# Patient Record
Sex: Male | Born: 1940 | Race: White | Hispanic: No | Marital: Married | Smoking: Former smoker
Health system: Southern US, Community
[De-identification: ages and names within clinical notes are randomized; demographics above are authoritative.]

## PROBLEM LIST (undated history)

## (undated) DIAGNOSIS — G4730 Sleep apnea, unspecified: Secondary | ICD-10-CM

## (undated) DIAGNOSIS — I429 Cardiomyopathy, unspecified: Secondary | ICD-10-CM

## (undated) DIAGNOSIS — R011 Cardiac murmur, unspecified: Secondary | ICD-10-CM

## (undated) DIAGNOSIS — I444 Left anterior fascicular block: Secondary | ICD-10-CM

## (undated) DIAGNOSIS — R058 Other specified cough: Secondary | ICD-10-CM

## (undated) DIAGNOSIS — Z95810 Presence of automatic (implantable) cardiac defibrillator: Secondary | ICD-10-CM

## (undated) DIAGNOSIS — E7800 Pure hypercholesterolemia, unspecified: Secondary | ICD-10-CM

## (undated) DIAGNOSIS — I493 Ventricular premature depolarization: Secondary | ICD-10-CM

## (undated) DIAGNOSIS — Z8679 Personal history of other diseases of the circulatory system: Secondary | ICD-10-CM

## (undated) DIAGNOSIS — I219 Acute myocardial infarction, unspecified: Secondary | ICD-10-CM

## (undated) DIAGNOSIS — N189 Chronic kidney disease, unspecified: Secondary | ICD-10-CM

## (undated) DIAGNOSIS — D649 Anemia, unspecified: Secondary | ICD-10-CM

## (undated) DIAGNOSIS — I4510 Unspecified right bundle-branch block: Secondary | ICD-10-CM

## (undated) DIAGNOSIS — K219 Gastro-esophageal reflux disease without esophagitis: Secondary | ICD-10-CM

## (undated) DIAGNOSIS — I452 Bifascicular block: Secondary | ICD-10-CM

## (undated) DIAGNOSIS — I499 Cardiac arrhythmia, unspecified: Secondary | ICD-10-CM

## (undated) DIAGNOSIS — J189 Pneumonia, unspecified organism: Secondary | ICD-10-CM

## (undated) DIAGNOSIS — Z9889 Other specified postprocedural states: Secondary | ICD-10-CM

## (undated) DIAGNOSIS — I10 Essential (primary) hypertension: Secondary | ICD-10-CM

## (undated) DIAGNOSIS — M1990 Unspecified osteoarthritis, unspecified site: Secondary | ICD-10-CM

## (undated) DIAGNOSIS — S225XXA Flail chest, initial encounter for closed fracture: Secondary | ICD-10-CM

## (undated) DIAGNOSIS — R05 Cough: Secondary | ICD-10-CM

## (undated) DIAGNOSIS — K649 Unspecified hemorrhoids: Secondary | ICD-10-CM

## (undated) DIAGNOSIS — C61 Malignant neoplasm of prostate: Secondary | ICD-10-CM

## (undated) DIAGNOSIS — I469 Cardiac arrest, cause unspecified: Secondary | ICD-10-CM

## (undated) DIAGNOSIS — I2510 Atherosclerotic heart disease of native coronary artery without angina pectoris: Secondary | ICD-10-CM

## (undated) DIAGNOSIS — Z950 Presence of cardiac pacemaker: Secondary | ICD-10-CM

## (undated) DIAGNOSIS — I509 Heart failure, unspecified: Secondary | ICD-10-CM

## (undated) HISTORY — PX: HERNIA REPAIR: SHX51

## (undated) HISTORY — PX: HAND SURGERY: SHX662

## (undated) HISTORY — PX: ICD IMPLANT: EP1208

## (undated) HISTORY — PX: CARDIAC CATHETERIZATION: SHX172

## (undated) HISTORY — PX: CORONARY ANGIOPLASTY: SHX604

## (undated) HISTORY — PX: TRACHEOSTOMY: SUR1362

## (undated) HISTORY — PX: OTHER SURGICAL HISTORY: SHX169

## (undated) HISTORY — PX: PROSTATE BIOPSY: SHX241

## (undated) HISTORY — DX: Presence of cardiac pacemaker: Z95.0

## (undated) HISTORY — PX: INGUINAL HERNIA REPAIR: SUR1180

## (undated) HISTORY — PX: INSERT / REPLACE / REMOVE PACEMAKER: SUR710

## (undated) HISTORY — PX: EYE SURGERY: SHX253

## (undated) HISTORY — DX: Essential (primary) hypertension: I10

## (undated) HISTORY — PX: TONSILLECTOMY: SUR1361

## (undated) HISTORY — PX: REPAIR EXTENSOR TENDON HAND: SUR1171

## (undated) HISTORY — PX: BACK SURGERY: SHX140

## (undated) HISTORY — DX: Presence of automatic (implantable) cardiac defibrillator: Z95.810

## (undated) MED FILL — Iron Sucrose Inj 20 MG/ML (Fe Equiv): INTRAVENOUS | Qty: 10 | Status: AC

---

## 2009-01-07 HISTORY — PX: COLONOSCOPY: SHX174

## 2014-12-23 ENCOUNTER — Other Ambulatory Visit: Payer: Self-pay | Admitting: Urology

## 2014-12-23 DIAGNOSIS — C61 Malignant neoplasm of prostate: Secondary | ICD-10-CM

## 2015-01-11 DIAGNOSIS — C61 Malignant neoplasm of prostate: Secondary | ICD-10-CM | POA: Diagnosis not present

## 2015-01-11 DIAGNOSIS — Z0000 Encounter for general adult medical examination without abnormal findings: Secondary | ICD-10-CM | POA: Diagnosis not present

## 2015-01-11 DIAGNOSIS — I255 Ischemic cardiomyopathy: Secondary | ICD-10-CM | POA: Diagnosis not present

## 2015-01-11 DIAGNOSIS — I420 Dilated cardiomyopathy: Secondary | ICD-10-CM | POA: Diagnosis not present

## 2015-01-12 ENCOUNTER — Telehealth: Payer: Self-pay | Admitting: Medical Oncology

## 2015-01-12 ENCOUNTER — Encounter: Payer: Self-pay | Admitting: Medical Oncology

## 2015-01-12 NOTE — Telephone Encounter (Signed)
Oncology Nurse Navigator Documentation  Oncology Nurse Navigator Flowsheets 01/12/2015  Navigator Encounter Type Telephone- Spoke with Missy at [facility] to request pathology slides for Prostate [facility] 01/20/15.  Barriers/Navigation Needs Coordination of Care  Interventions Coordination of Care

## 2015-01-12 NOTE — Progress Notes (Signed)
Oncology Nurse Navigator Documentation  Oncology Nurse Navigator Flowsheets 01/03/2015 01/12/2015  Navigator Location [facility]- -  Navigator Encounter Type Introductory phone call Telephone  Barriers/Navigation Needs - Coordination of Care  Interventions - Coordination of Care  Time Spent with Patient 15 -

## 2015-01-12 NOTE — Telephone Encounter (Signed)
Oncology Nurse Navigator Documentation  Oncology Nurse Navigator Flowsheets 01/03/2015 01/12/2015 01/12/2015  Navigator Location [facility] - -  Navigator Encounter Type Introductory phone call Telephone Telephone-   I confirmed with the patient he is aware of his referral to the clinic 01/20/15. He states that he received a call from [facility] regarding his scans scheduled for 01/16/15. They are having a hard time getting insurance approval. He asked what he will need to do if they can not get theses scheduled. I informed him that we really need the results for the clinic and we would reschedule his appointment for 1/24. I asked him to call me when he hears from [facility] regarding these scans. He voiced understanding.   He confirmed he received the packet of information in the mail. I asked him to complete and bring the day of his appointment.    I asked him to call me if he has any questions or concerns regarding his appointments or the forms he needs to complete.  Telephone - - Outgoing Call;Appt Confirmation/Clarification  Barriers/Navigation Needs - Coordination of Care -  Interventions - Coordination of Care -  Time Spent with Patient 15 - 15

## 2015-01-16 ENCOUNTER — Encounter (HOSPITAL_COMMUNITY)
Admission: RE | Admit: 2015-01-16 | Discharge: 2015-01-16 | Disposition: A | Discharge status: Home / Self Care | Payer: Commercial Managed Care - HMO | Source: Ambulatory Visit | Attending: Urology | Admitting: Urology

## 2015-01-16 DIAGNOSIS — C61 Malignant neoplasm of prostate: Secondary | ICD-10-CM

## 2015-01-16 MED ORDER — TECHNETIUM TC 99M MEDRONATE IV KIT
25.7000 | PACK | Freq: Once | INTRAVENOUS | Status: AC | PRN
  Administered 2015-01-16: 25.7 via INTRAVENOUS

## 2015-01-19 ENCOUNTER — Telehealth: Payer: Self-pay | Admitting: Medical Oncology

## 2015-01-19 ENCOUNTER — Encounter: Payer: Self-pay | Admitting: Radiation Oncology

## 2015-01-19 NOTE — Telephone Encounter (Signed)
Oncology Nurse Navigator Documentation  Oncology Nurse Navigator Flowsheets 01/12/2015 01/12/2015 01/19/2015  Navigator Location - - -  Navigator Encounter Type Telephone Telephone Telephone  Telephone - Outgoing Call;Appt Confirmation/Clarification Outgoing Call;Appt Confirmation/Clarification- I reminded him to bring his completed medical forms. We reviewed directions to [facility] and the registration process. He voiced understanding and I asked him to call with any questions or concerns.  Barriers/Navigation Needs Coordination of Care - No barriers at this time  Interventions Coordination of Care - -  Time Spent with Patient - 15 15

## 2015-01-19 NOTE — Progress Notes (Addendum)
GU Location of Tumor / Histology: prostatic adenocarcinoma  If Prostate Cancer, Gleason Score is (5 + 9) and PSA is (12.22) on 10/23/14  Jesse Mann referred to Dr. Louis Meckel on 10/07/14 by Dr. Williams Che for an elevated PSA.  Biopsies of prostate (if applicable) revealed:    Past/Anticipated interventions by urology, if any: Biopsy and referral to [facility]  Past/Anticipated interventions by medical oncology, if any: consulted during [facility]  Weight changes, if any: no  Bowel/Bladder complaints, if any: dysuria, frequency, nocturia x 1 and urgency   Nausea/Vomiting, if any: no  Pain issues, if any:  Low back pain  SAFETY ISSUES:  Prior radiation? no  Pacemaker/ICD? no  Possible current pregnancy? no  Is the patient on methotrexate? no  Current Complaints / other details:  75 year old male. NKDA. Retired. [place] with 2 sons and 1 daughter. Family hx of colon cancer:Father. Prostate volume 47.75 cc.Reports his has colonscopy was in 2011. Reports he does not perform regular self testicular exams.   Reports fatigue and loss of sleep. Reports low back pain. Wear glasses. Reports sinus problems. Wears dentures. Reports an irregular heart beat. Reports a cough. Reports rectal bleeding. Reports dribbling with urination.

## 2015-01-20 ENCOUNTER — Encounter: Payer: Self-pay | Admitting: Medical Oncology

## 2015-01-20 ENCOUNTER — Ambulatory Visit
Admission: RE | Admit: 2015-01-20 | Discharge: 2015-01-20 | Disposition: A | Discharge status: Home / Self Care | Payer: Commercial Managed Care - HMO | Source: Ambulatory Visit | Attending: Radiation Oncology | Admitting: Radiation Oncology

## 2015-01-20 ENCOUNTER — Ambulatory Visit (HOSPITAL_BASED_OUTPATIENT_CLINIC_OR_DEPARTMENT_OTHER): Payer: Commercial Managed Care - HMO | Admitting: Oncology

## 2015-01-20 ENCOUNTER — Encounter: Payer: Self-pay | Admitting: General Practice

## 2015-01-20 ENCOUNTER — Encounter: Payer: Self-pay | Admitting: Radiation Oncology

## 2015-01-20 ENCOUNTER — Encounter: Payer: Self-pay | Admitting: Adult Health

## 2015-01-20 VITALS — BP 131/67 | HR 69 | Resp 16 | Ht 66.0 in | Wt 147.5 lb

## 2015-01-20 DIAGNOSIS — C61 Malignant neoplasm of prostate: Secondary | ICD-10-CM | POA: Insufficient documentation

## 2015-01-20 HISTORY — DX: Malignant neoplasm of prostate: C61

## 2015-01-20 HISTORY — DX: Cardiac arrhythmia, unspecified: I49.9

## 2015-01-20 HISTORY — DX: Sleep apnea, unspecified: G47.30

## 2015-01-20 HISTORY — DX: Gastro-esophageal reflux disease without esophagitis: K21.9

## 2015-01-20 HISTORY — DX: Cardiac murmur, unspecified: R01.1

## 2015-01-20 HISTORY — DX: Unspecified osteoarthritis, unspecified site: M19.90

## 2015-01-20 HISTORY — DX: Unspecified hemorrhoids: K64.9

## 2015-01-20 HISTORY — DX: Acute myocardial infarction, unspecified: I21.9

## 2015-01-20 NOTE — Progress Notes (Signed)
Radiation Oncology         [phone] ________________________________  Multidisciplinary Prostate Cancer Clinic  Initial Radiation Oncology Consultation  Name: Jesse Mann MRN: [id number]  Date: 01/20/2015  DOB: May 05, 1940  [id number], Herbie Baltimore, MD  Raynelle Bring, MD   REFERRING PHYSICIAN: Raynelle Bring, MD  DIAGNOSIS: 75 y.o. 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  No diagnosis found.  HISTORY OF PRESENT ILLNESS::Jesse Mann is a 75 y.o. gentleman.  He was noted to have an elevated PSA of 10.1 by his primary care physician, Dr. Venetia Maxon.  Accordingly, he was referred for evaluation in urology by Dr. Louis Meckel on 10/07/14,  digital rectal examination was performed at that time revealing a 45 gram gland with a palpable nodule involving the left mid gland.  The patient proceeded to transrectal ultrasound with 12 biopsies of the prostate on 12/20/14.  The prostate volume measured 47.75 cc.  Out of 12 core biopsies,10 were positive.  The maximum Gleason score was 5+4, and this was seen in bilaterally.  Pelvic Ct on 01/16/15 showed no evidence of metastatic disease. Bone scan on same date showed two punctate areas in the left face/skull but these were not suggestive of metastatic disease.   The patient reviewed the biopsy results with his urologist and he has kindly been referred today to the multidisciplinary prostate cancer clinic for presentation of pathology and radiology studies in our conference for discussion of potential radiation treatment options and clinical evaluation.      PREVIOUS RADIATION THERAPY: No  PAST MEDICAL HISTORY:  has a past medical history of Prostate cancer ([place]); Arthritis; Cardiac arrhythmia; Cardiac murmur; GERD (gastroesophageal reflux disease); Myocardial infarction [facility]); and Sleep apnea.    PAST SURGICAL HISTORY: Past Surgical History  Procedure Laterality Date  . Prostate biopsy     . Cardiac surgery    . Inguinal hernia repair    . Tonsillectomy      FAMILY HISTORY: family history includes Cancer in his father; Heart failure in his mother.  SOCIAL HISTORY:  reports that he quit smoking about 50 years ago. His smoking use included Cigarettes. He has a 10 pack-year smoking history. He has never used smokeless tobacco. He reports that he does not drink alcohol or use illicit drugs.  ALLERGIES: Review of patient's allergies indicates no known allergies.  MEDICATIONS:  Current Outpatient Prescriptions  Medication Sig Dispense Refill  . carvedilol (COREG) 6.25 MG tablet Take 6.25 mg by mouth.    . nitroGLYCERIN (NITROSTAT) 0.4 MG SL tablet Place 0.4 mg under the tongue.    . simvastatin (ZOCOR) 10 MG tablet Take 10 mg by mouth.    Marland Kitchen aspirin EC 81 MG tablet Take 81 mg by mouth.     No current facility-administered medications for this encounter.    REVIEW OF SYSTEMS:  A 15 point review of systems is documented in the electronic medical record. This was obtained by the nursing staff. However, I reviewed this with the patient to discuss relevant findings and make appropriate changes.  A comprehensive review of systems was negative..  The patient completed an IPSS and IIEF questionnaire.  His IPSS score was 19 indicating moderate urinary outflow obstructive symptoms.  He indicated that his erectile function is not applicable.   Pt reports dysuria, frequency, nocturia x 1 and urgency.  NKDA. Retired. [place] with 2 sons and 1 daughter. Family hx of colon cancer:Father. Prostate volume 47.75  cc.Reports his has colonscopy was in 2011. Reports he does not perform regular self testicular exams.  Reports fatigue and loss of sleep. Reports low back pain. Wear glasses. Reports sinus problems. Wears dentures. Reports an irregular heart beat. Reports a cough. Reports rectal bleeding. Reports dribbling with urination.    PHYSICAL EXAM: This patient is in no acute  distress.  He is alert and oriented.   vitals were not taken for this visit. He exhibits no respiratory distress or labored breathing.  He appears neurologically intact.  His mood is pleasant.  His affect is appropriate.  Please note the digital rectal exam findings described above.  KPS = 100  100 - Normal; no complaints; no evidence of disease. 90   - Able to carry on normal activity; minor signs or symptoms of disease. 80   - Normal activity with effort; some signs or symptoms of disease. 48   - Cares for self; unable to carry on normal activity or to do active work. 60   - Requires occasional assistance, but is able to care for most of his personal needs. 50   - Requires considerable assistance and frequent medical care. 31   - Disabled; requires special care and assistance. 67   - Severely disabled; hospital admission is indicated although death not imminent. 33   - Very sick; hospital admission necessary; active supportive treatment necessary. 10   - Moribund; fatal processes progressing rapidly. 0     - Dead  Karnofsky DA, Abelmann WH, Craver LS and Burchenal JH [phone]) The use of the nitrogen mustards in the palliative treatment of carcinoma: with particular reference to bronchogenic carcinoma Cancer 1 [id number]   LABORATORY DATA:  No results found for: WBC, HGB, HCT, MCV, PLT No results found for: NA, K, CL, CO2 No results found for: ALT, AST, GGT, ALKPHOS, BILITOT   RADIOGRAPHY: Nm Bone Scan Whole Body  01/16/2015  CLINICAL DATA:  Prostate cancer. EXAM: NUCLEAR MEDICINE WHOLE BODY BONE SCAN TECHNIQUE: Whole body anterior and posterior images were obtained approximately 3 hours after intravenous injection of radiopharmaceutical. RADIOPHARMACEUTICALS:  [id number] millicurie mCi [id number] MDP IV COMPARISON:  No prior. FINDINGS: Bilateral renal function excretion. Two punctate areas of increased activity noted over the left facial region. Punctate area of increased activity noted over the  right occipital region. Maxillofacial and head CT with attention to bone window suggested for further evaluation. Mild activity noted about the left knee is most likely degenerative. IMPRESSION: 1. Two punctate areas of increased activity noted about the left face/skull. One punctate area of increased activity noted about the right occipital skull. Maxillofacial and head CT with attention to bone windows suggested for further evaluation. 2.  Changes consistent degenerative changes left knee. 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 His Gleason's Score puts him into the high risk group.  Accordingly he is eligible for a variety of potential treatment options including prostatectomy vs radiation treatment with 2 years of androgen deprivation.  PLAN: Today, I talked to the patient and family about the findings and work-up thus far.  We discussed the natural history of prostate adenocarcinoma and general treatment, highlighting the role of radiotherapy in the management.  We discussed the available radiation techniques, and focused on the details of logistics and delivery.  We  reviewed the anticipated acute and late sequelae associated with radiation in this setting.  The patient was encouraged to ask questions that I answered to the best of my ability.  I filled out a patient counseling form during our discussion including treatment diagrams.  We retained a copy for our records.  I spent 60 minutes minutes face to face with the patient and more than 50% of that time was spent in counseling and/or coordination of care.    ------------------------------------------------  Sheral Apley. Tammi Klippel, M.D.    This document serves as a record of services personally performed by Tyler Pita, MD. It was created on his behalf by Jenell Milliner, a trained medical scribe. The creation of this record is based on the scribe's personal observations and the provider's statements to them. This document has been checked and approved by the attending provider.

## 2015-01-20 NOTE — Progress Notes (Signed)
Please see consult note.  

## 2015-01-20 NOTE — Consult Note (Signed)
Chief Complaint  Prostate Cancer   Reason For Visit  Reason for consult: To discuss treatment options for high risk prostate cancer. Physician requesting consult: Dr. Burman Nieves PCP: Dr. Williams Che Location of consult: Prostate Cancer Multidisciplinary Clinic at [facility]   History of Present Illness  Jesse Mann is a 75 year old gentleman with a history of myocardial infarction due to coronary artery disease s/p cardiac stent placement in 2014 (on ASA 81 mg) and rheumatoid arthritis.  He was seen by Dr. Louis Meckel initially in October when his primary care physician had noted his PSA to be elevated at 10.1 and the patient was complaining of increasingly bothersome urinary symptoms.  He was noted to have an abnormal DRE with nodularity at the left mid prostate and his repeat PSA was 12.22.  He eventually underwent a TRUS biopsy of the prostate on 12/20/14 which confirmed Gleason 5+4=9 adenocarcinoma of the prostate with 9 out 12 biopsy cores positive for malignancy including high volume percentages in all of the left sided cores. He was noted to have a prominent median prostate lobe. Staging studies were performed on 01/16/15 including a CT scan of the pelvis and a bone scan. His CT scan was negative for metastatic disease and his bone scan demonstrated increased activity in two foci in the face/skull that are felt to be unlikely to represent metastatic disease.  Further imaging was not recommended by radiology at the [facility] conference today.  He has undergone bilateral hernia repairs.  TNM stage: cT3a N M  PSA: 12.22 Gleason score: 5+4=9 Biopsy (12/20/14): 9/12 cores positive    Left: L lateral apex (90%, 4+5=9), L apex (70%, 4+3=7, PNI), L lateral mid (95%, 5+3=8), L mid (90%, 5+4=9, PNI), L lateral base (95%, 4+4=8), L base (90%, 5+4=9)   Right: R lateral apex (10%, 4+3=7), R mid (50%, 4+5=9), R lateral base (< 1%, 4+4=8) Prostate volume: 47.8 cc, median lobe  Urinary function:  He has significant LUTS including incomplete bladder emptying, intermittency, weak stream, and straining to void.  He is not on medical therapy and he is not overly bothered by his symptoms. Erectile function:  He has severe erectile dysfunction and is not sexually active.  SHIM score is 1.  This is not a priority to him.   Past Medical History  1. History of arthritis (Z87.39)  2. History of cardiac arrhythmia (Z86.79)  3. History of heartburn (Z87.898)  4. History of myocardial infarction (I25.2)  5. History of sleep apnea (Z86.69)  Surgical History  1. History of Cath Stent Placement  2. History of Inguinal Hernia Repair  3. History of Tonsillectomy  Current Meds  1. Aspirin 81 MG TABS;  Therapy: (Recorded:30Sep2016) to Recorded  2. Carvedilol 3.125 MG Oral Tablet;  Therapy: (Recorded:30Sep2016) to Recorded  3. Probiotic CAPS;  Therapy: (Recorded:30Sep2016) to Recorded  4. Simvastatin 10 MG Oral Tablet;  Therapy: (Recorded:30Sep2016) to Recorded  Allergies  1. No Known Drug Allergies  Family History  1. Family history of Death of family member : Mother, Father  2. Family history of colon cancer (Z80.0) : Father  3. Family history of heart failure (Z82.49) : Mother  Social History   Denied: History of Alcohol use   Former smoker [phone])   Married   Number of children   Retired  Review of Systems AU Complete-Male: Constitutional, skin, eye, otolaryngeal, hematologic/lymphatic, cardiovascular, pulmonary, endocrine, musculoskeletal, gastrointestinal, neurological and psychiatric system(s) were reviewed and pertinent findings if present are noted and are  otherwise negative.  Constitutional: feeling tired (fatigue).  ENT: sinus problems.  Respiratory: cough.  Musculoskeletal: back pain.    Physical Exam Constitutional: Well nourished and well developed . No acute distress.    Results/Data  I have independently reviewed his medical records, PSA results, pathology  slides, bone scan, and CT scan today.  Findings are as described above.     Assessment  1. Prostate cancer (C61)  Discussion/Summary   1. High risk locally advanced prostate cancer: I had a detailed discussion with Jesse Mann, his wife, and his daughter today.  We discussed the fact that he appears to have a high grade, locally advanced prostate cancer without evidence of measurable metastatic disease.  However, he understands the high risk of potentially having micrometastatic disease and need for multimodality therapy to offer him the best chance at curative treatment.  We discussed options for treatment today including primary surgical therapy with the likely need for adjuvant therapy in the form of radiation (+/- ADT) vs primary radiation therapy with long term ADT.  We spent the majority of our discussion reviewing the pros and cons of these treatment options for him.   Jesse Mann understands the potential benefits of surgery with regard to his significant LUTS and prominent prostate median lobe.  However, he also understands the potential negative aspects of risk of major surgery with a history of CAD (he would need cardiac risk assessment from his cardiologist in [facility]) and the risk of incontinence at age 52 as well as the high chance he would still require adjuvant radiation therapy.  He understands the potential benefits of radiation therapy with regard to avoiding the morbidity and recovery with surgery but with potential to exacerbate his voiding symptoms and the need for long term ADT.   The patient was counseled about the natural history of prostate cancer and the standard treatment options that are available for prostate cancer. It was explained to him how his age and life expectancy, clinical stage, Gleason score, and PSA affect his prognosis, the decision to proceed with additional staging studies, as well as how that information influences recommended treatment strategies. We  discussed the roles for active surveillance, radiation therapy, surgical therapy, androgen deprivation, as well as ablative therapy options for the treatment of prostate cancer as appropriate to his individual cancer situation. We discussed the risks and benefits of these options with regard to their impact on cancer control and also in terms of potential adverse events, complications, and impact on quiality of life particularly related to urinary, bowel, and sexual function. The patient was encouraged to ask questions throughout the discussion today and all questions were answered to his stated satisfaction. In addition, the patient was provided with and/or directed to appropriate resources and literature for further education about prostate cancer and treatment options.   We discussed surgical therapy for prostate cancer including the different available surgical approaches. We discussed, in detail, the risks and expectations of surgery with regard to cancer control, urinary control, and erectile function as well as the expected postoperative recovery process. Additional risks of surgery including but not limited to bleeding, infection, hernia formation, nerve damage, lymphocele formation, bowel/rectal injury potentially necessitating colostomy, damage to the urinary tract resulting in urine leakage, urethral stricture, and the cardiopulmonary risks such as myocardial infarction, stroke, death, venothromboembolism, etc. were explained. The risk of open surgical conversion for robotic/laparoscopic prostatectomy was also discussed.   After a thoughtful discussion, Jesse Mann adamantly wishes to proceed with primary surgical  treatment.  I recommended that he get cardiac clearance prior to surgery and he understands that he should continue his aspirin 81 mg perioperatively with his history of cardiac stent placement.  I will notify Dr. Louis Meckel of Jesse Mann decision.  CC: Dr. Burman Nieves Dr. Williams Che Dr. Ledon Snare Dr. Zola Button  A total of 55 minutes were spent in the overall care of the patient today with 55 minutes in direct face to face consultation.    Signatures Electronically signed by : Raynelle Bring, M.D.; Jan 20 2015 12:25PM EST

## 2015-01-20 NOTE — Progress Notes (Signed)
Oncology Nurse Navigator Documentation  Oncology Nurse Navigator Flowsheets 01/12/2015 01/19/2015 01/20/2015  Navigator Location - - [facility]  Navigator Encounter Type Telephone Telephone Clinic/MDC  Telephone Outgoing Call;Appt Confirmation/Clarification Outgoing Call;Appt Confirmation/Clarification -  Abnormal Finding Date - - 11/02/2014  Confirmed Diagnosis Date - - 12/20/2014  Barriers/Navigation Needs - No barriers at this time No barriers at this time  Interventions - - -  Support Groups/Services - - Prostate Support Group;Friends and Family  Time Spent with Patient 15 15 30                                  Care Plan Summary  Name: Mr. Jesse Mann DOB: 16-Oct-1940   Your Medical Team:   Urologist -  Dr. Raynelle Bring, [facility]  Radiation Oncologist - Dr. Tyler Pita, [facility]   Medical Oncologist - Dr. Zola Button, [place]  Recommendations: 1) Surgery 2) Androgen Deprivation 3) Radiation  * These recommendations are based on information available as of today's consult.      Recommendations may change depending on the results of further tests or exams.    Next Steps: 1) Dr. Carlton Adam office will call you with details for surgery.( He will contact your cardiologist for cardiac clearance. )   When appointments need to be scheduled, you will be contacted by [facility] and/or [facility].  Questions?  Please do not hesitate to call Jesse Rue, Jesse Mann, Jesse Mann, Jesse Mann at [phone] any questions or concerns.  Shirlean Mylar is your Oncology Nurse Navigator and is available to assist you while you're receiving your medical care at [facility].

## 2015-01-20 NOTE — Progress Notes (Signed)
   Survivorship Program  Jesse Mann is a very pleasant 75 y.o. gentleman from [place], [place] with a diagnosis of prostate adenocarcinoma. He presents today with his wife to the [facility]) for treatment consideration and recommendations from the urologist/surgeon, radiation oncologist, and medical oncologist.    I briefly met with Jesse Mann and his wife during his [place] visit today. We discussed the purpose of the Survivorship Clinic, which will include monitoring for recurrence, coordinating completion of age and gender-appropriate cancer screenings, promotion of overall wellness, as well as managing potential late/long-term side effects of anti-cancer treatments.     As of today, the intent of treatment for Jesse Mann is cure/local control, therefore he will be eligible for the Survivorship Clinic upon his completion of treatment.  His survivorship care plan (SCP) will be reviewed with him during an in-person visit with myself once he has completed treatment.    Jesse Mann was encouraged to ask questions and all questions were answered to his satisfaction.  He was given my business card and encouraged to contact me with any concerns regarding survivorship.  I look forward to participating in his care.    Mike Craze, NP [place] [phone]

## 2015-01-20 NOTE — Progress Notes (Signed)
[place] Psychosocial Distress Screening Spiritual Care  Shadowed by counseling intern Vaughan Sine, met with Mr Breighner, wife Lavonna Monarch, and daughter Sharyn Lull at [facility] to introduce [place] team/resources, reviewing distress screen per protocol.  The patient scored a 5 on the Psychosocial Distress Thermometer which indicates moderate distress. Also assessed for distress and other psychosocial needs.   ONCBCN DISTRESS SCREENING 01/20/2015  Screening Type Initial Screening  Distress experienced in past week (1-10) 5  Practical problem type Insurance  Emotional problem type Adjusting to illness  Spiritual/Religous concerns type Facing my mortality  Referral to support programs Yes  Other Spiritual Care, counseling interns   Pt and family verbalized gratitude for the helpful thoroughness of [place] and healthcare team, noting that the additional info and support have decreased distress level to 2.  Per Mr Kaufhold, enjoying family (including grands and first great-grandchild on the way) and [place] [employer] are two key sources of meaning and purpose in his life.  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  Per pt, receiving this dx reinforces the preciousness of these priorities.  He reports good support from family and community.  Follow up needed: No.  [place] family is aware of going [place] team/resource availability, but please also page as needs arise/circumstances change.  Thank you.  [place], [place], [facility] Pager [phone] Voicemail  [phone]

## 2015-01-20 NOTE — Consult Note (Signed)
Reason for Referral: prostate cancer.  HPI: Jesse Mann is a pleasant 75 year old gentleman currently of [place] where he lived the majority of his life. He does have history of coronary artery disease with stent placement in the past but no active heart issues. He started developing symptoms of dysuria, frequency and urgency and low urine stream. He was evaluated by urology and found to have an elevated PSA of 12.22. 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 Staging workup including CT scan and bone scan did not show any evidence of metastatic disease. Patient referred to prostate cancer multidisciplinary clinic for discussion. He currently reports no major complaints other than low urinary stream but no pain or nocturia. He has not reported any hematuria. He continues to feel reasonably active and currently retired from his work. He is able to perform all activities of daily living without any hindrance or decline.  He does not report any headaches, blurry vision, syncope or seizures. He does not report any fevers or chills or sweats. Does not report any cough, wheezing or hemoptysis. Does not report any nausea, vomiting, abdominal pain does not report any constipation, diarrhea or satiety. He does not report any frequency urgency or hesitancy. Does not report any skeletal complaints. Remaining review of systems unremarkable.  Past Medical History  Diagnosis Date  . Prostate cancer ([place])   . Arthritis   . Cardiac arrhythmia   . Cardiac murmur   . GERD (gastroesophageal reflux disease)   . Myocardial infarction ([place])   . Sleep apnea   . Hemorrhoids   :  Past Surgical History  Procedure Laterality Date  . Prostate biopsy    . Cardiac surgery    . Inguinal hernia repair    . Tonsillectomy    . Repair extensor tendon hand    . Hernia repair    :   Current outpatient prescriptions:  .   aspirin EC 81 MG tablet, Take 81 mg by mouth., Disp: , Rfl:  .  carvedilol (COREG) 6.25 MG tablet, Take 6.25 mg by mouth., Disp: , Rfl:  .  nitroGLYCERIN (NITROSTAT) 0.4 MG SL tablet, Place 0.4 mg under the tongue., Disp: , Rfl:  .  Probiotic Product (PROBIOTIC DAILY PO), Take by mouth., Disp: , Rfl:  .  simvastatin (ZOCOR) 10 MG tablet, Take 10 mg by mouth., Disp: , Rfl: :  No Known Allergies:  Family History  Problem Relation Age of Onset  . Heart failure Mother   . Cancer Father     colon  . Cancer Paternal Grandmother     type unknown  :  Social History   Social History  . Marital Status: Married    Spouse Name: N/A  . Number of Children: N/A  . Years of Education: N/A   Occupational History  . Not on file.   Social History Main Topics  . Smoking status: Former Smoker -- 1.00 packs/day for 10 years    Types: Cigarettes    Quit date: 01/07/1965  . Smokeless tobacco: Never Used  . Alcohol Use: No  . Drug Use: No  . Sexual Activity: No   Other Topics Concern  . Not on file   Social History Narrative  :  Pertinent items are noted in HPI.  Exam: ECOG 0 General appearance: alert and cooperative appeared without distress. Head: Normocephalic, without obvious abnormality Throat: lips, mucosa, and tongue normal; teeth and gums  normal no oral ulcers or lesions. Neck: no adenopathy Back: negative Resp: clear to auscultation bilaterally Chest wall: no tenderness Cardio: regular rate and rhythm, S1, S2 normal, no murmur, click, rub or gallop GI: soft, non-tender; bowel sounds normal; no masses,  no organomegaly no shifting dullness or ascites. Extremities: extremities normal, atraumatic, no cyanosis or edema Pulses: 2+ and symmetric Skin: Skin color, texture, turgor normal. No rashes or lesions Lymph nodes: Cervical, supraclavicular, and axillary nodes normal.    Nm Bone Scan Whole Body  01/16/2015  CLINICAL DATA:  Prostate cancer. EXAM: NUCLEAR MEDICINE WHOLE  BODY BONE SCAN TECHNIQUE: Whole body anterior and posterior images were obtained approximately 3 hours after intravenous injection of radiopharmaceutical. RADIOPHARMACEUTICALS:  [id number] millicurie mCi [id number] MDP IV COMPARISON:  No prior. FINDINGS: Bilateral renal function excretion. Two punctate areas of increased activity noted over the left facial region. Punctate area of increased activity noted over the right occipital region. Maxillofacial and head CT with attention to bone window suggested for further evaluation. Mild activity noted about the left knee is most likely degenerative. IMPRESSION: 1. Two punctate areas of increased activity noted about the left face/skull. One punctate area of increased activity noted about the right occipital skull. Maxillofacial and head CT with attention to bone windows suggested for further evaluation. 2.  Changes consistent degenerative changes left knee. Electronically Signed   By: Marcello Moores  Register   On: 01/16/2015 14:52    Assessment and Plan:    75 year old gentleman with prostate cancer diagnosed in December 2016. He presented with a PSA of 12.22 and a Gleason score 4+5 = 9 with high-volume disease. He does have some urinary symptoms including slow stream and frequency and occasional urgency. His pathology was discussed today with the reviewing pathologist. His imaging studies were also discussed with radiology. He has localized high-risk cancer and his treatment options were reviewed today.  Definitive radiation therapy with androgen deprivation is a surgical option. The role of hormone therapy was reviewed today and the duration of about 2 years was also discussed. Complications associated with this therapy were reviewed including hot flashes, weight gain, osteoporosis, erectile dysfunction among others.  Alternatively, radical prostatectomy would be a consideration but might necessitate adjuvant radiation therapy afterwards if he has extraprostatic  extension or potential positive margins.  Both of these options are fairly reasonable and he will consider and make his decision in the near future.

## 2015-01-23 ENCOUNTER — Encounter: Payer: Self-pay | Admitting: *Deleted

## 2015-01-30 ENCOUNTER — Telehealth: Payer: Self-pay | Admitting: Medical Oncology

## 2015-01-30 ENCOUNTER — Encounter: Payer: Self-pay | Admitting: Medical Oncology

## 2015-01-30 NOTE — Telephone Encounter (Signed)
Oncology Nurse Navigator Documentation  Oncology Nurse Navigator Flowsheets 01/19/2015 01/20/2015 01/30/2015  Navigator Location - [facility] -  Navigator Encounter Type Telephone Clinic/MDC Telephone  Telephone Outgoing Call;Appt Confirmation/Clarification - Incoming Call- Jesse Mann called stating he has not heard anything from Dr. Carlton Adam office regarding his surgery. He needed cardiac clearance and he is not sure if they were able to obtain. I informed him that I would call Dr. Carlton Adam office to inquire. He voiced understanding.  Abnormal Finding Date - 11/02/2014 -  Confirmed Diagnosis Date - 12/20/2014 -  Barriers/Navigation Needs No barriers at this time No barriers at this time -  Interventions - - Coordination of Care  Coordination of Care - - Appts  Support Groups/Services - Prostate Support Group;Friends and Family -  Time Spent with Patient 15 30 15

## 2015-01-30 NOTE — Telephone Encounter (Signed)
Oncology Nurse Navigator Documentation  Oncology Nurse Navigator Flowsheets 01/20/2015 01/30/2015 01/30/2015  Navigator Location [facility] - -  Navigator Encounter Type Clinic/MDC Telephone Telephone  Telephone - Incoming Call Outgoing Call;Appt Confirmation/Clarification  Abnormal Finding Date 11/02/2014 - -  Confirmed Diagnosis Date 12/20/2014 - -  Barriers/Navigation Needs No barriers at this time - -  Interventions - Coordination of Care Coordination of Care- I spoke with Gwyn in  Dr. Carlton Adam office to inform her that Mr.Rawlins is concerned he has not heard anything regarding his surgery. She states that she will check with the surgery schedulers to see if they have a date and if they have heard from the cardiologist. If not she will speak with Dr. Louis Meckel and contact the patient. I notified Mr. Shinabarger of the above and asked him to call me back if he does not get a reply from [facility]. He voiced understanding.  Coordination of Care - Appts Appts  Support Groups/Services Prostate Support Group;Friends and Family - -  Time Spent with Patient [id number]

## 2015-02-03 ENCOUNTER — Other Ambulatory Visit: Payer: Self-pay | Admitting: Urology

## 2015-02-15 DIAGNOSIS — I429 Cardiomyopathy, unspecified: Secondary | ICD-10-CM | POA: Diagnosis not present

## 2015-02-15 DIAGNOSIS — I493 Ventricular premature depolarization: Secondary | ICD-10-CM | POA: Diagnosis not present

## 2015-02-15 DIAGNOSIS — I2510 Atherosclerotic heart disease of native coronary artery without angina pectoris: Secondary | ICD-10-CM | POA: Diagnosis not present

## 2015-02-21 DIAGNOSIS — I429 Cardiomyopathy, unspecified: Secondary | ICD-10-CM | POA: Diagnosis not present

## 2015-02-21 DIAGNOSIS — I2510 Atherosclerotic heart disease of native coronary artery without angina pectoris: Secondary | ICD-10-CM | POA: Diagnosis not present

## 2015-02-21 DIAGNOSIS — I493 Ventricular premature depolarization: Secondary | ICD-10-CM | POA: Diagnosis not present

## 2015-02-27 DIAGNOSIS — I429 Cardiomyopathy, unspecified: Secondary | ICD-10-CM | POA: Diagnosis not present

## 2015-02-27 DIAGNOSIS — I2510 Atherosclerotic heart disease of native coronary artery without angina pectoris: Secondary | ICD-10-CM | POA: Diagnosis not present

## 2015-02-28 ENCOUNTER — Encounter (HOSPITAL_COMMUNITY)
Admission: RE | Admit: 2015-02-28 | Discharge: 2015-02-28 | Disposition: A | Discharge status: Home / Self Care | Payer: Commercial Managed Care - HMO | Source: Ambulatory Visit | Attending: Urology | Admitting: Urology

## 2015-02-28 ENCOUNTER — Encounter (HOSPITAL_COMMUNITY): Payer: Self-pay

## 2015-02-28 DIAGNOSIS — D696 Thrombocytopenia, unspecified: Secondary | ICD-10-CM | POA: Diagnosis not present

## 2015-02-28 DIAGNOSIS — C7982 Secondary malignant neoplasm of genital organs: Secondary | ICD-10-CM | POA: Diagnosis not present

## 2015-02-28 DIAGNOSIS — D638 Anemia in other chronic diseases classified elsewhere: Secondary | ICD-10-CM | POA: Diagnosis not present

## 2015-02-28 DIAGNOSIS — N529 Male erectile dysfunction, unspecified: Secondary | ICD-10-CM | POA: Diagnosis present

## 2015-02-28 DIAGNOSIS — D62 Acute posthemorrhagic anemia: Secondary | ICD-10-CM | POA: Diagnosis not present

## 2015-02-28 DIAGNOSIS — Z800 Family history of malignant neoplasm of digestive organs: Secondary | ICD-10-CM | POA: Diagnosis not present

## 2015-02-28 DIAGNOSIS — N2889 Other specified disorders of kidney and ureter: Secondary | ICD-10-CM | POA: Diagnosis not present

## 2015-02-28 DIAGNOSIS — R339 Retention of urine, unspecified: Secondary | ICD-10-CM | POA: Diagnosis present

## 2015-02-28 DIAGNOSIS — C61 Malignant neoplasm of prostate: Secondary | ICD-10-CM | POA: Diagnosis not present

## 2015-02-28 DIAGNOSIS — M1990 Unspecified osteoarthritis, unspecified site: Secondary | ICD-10-CM | POA: Diagnosis present

## 2015-02-28 DIAGNOSIS — N99820 Postprocedural hemorrhage and hematoma of a genitourinary system organ or structure following a genitourinary system procedure: Secondary | ICD-10-CM | POA: Diagnosis not present

## 2015-02-28 DIAGNOSIS — G4730 Sleep apnea, unspecified: Secondary | ICD-10-CM | POA: Diagnosis not present

## 2015-02-28 DIAGNOSIS — R7989 Other specified abnormal findings of blood chemistry: Secondary | ICD-10-CM | POA: Diagnosis not present

## 2015-02-28 DIAGNOSIS — R071 Chest pain on breathing: Secondary | ICD-10-CM | POA: Diagnosis not present

## 2015-02-28 DIAGNOSIS — R008 Other abnormalities of heart beat: Secondary | ICD-10-CM | POA: Diagnosis not present

## 2015-02-28 DIAGNOSIS — I951 Orthostatic hypotension: Secondary | ICD-10-CM | POA: Diagnosis not present

## 2015-02-28 DIAGNOSIS — Z7982 Long term (current) use of aspirin: Secondary | ICD-10-CM | POA: Diagnosis not present

## 2015-02-28 DIAGNOSIS — Z79899 Other long term (current) drug therapy: Secondary | ICD-10-CM | POA: Diagnosis not present

## 2015-02-28 DIAGNOSIS — R079 Chest pain, unspecified: Secondary | ICD-10-CM | POA: Diagnosis not present

## 2015-02-28 DIAGNOSIS — D649 Anemia, unspecified: Secondary | ICD-10-CM | POA: Diagnosis not present

## 2015-02-28 DIAGNOSIS — S301XXA Contusion of abdominal wall, initial encounter: Secondary | ICD-10-CM | POA: Diagnosis not present

## 2015-02-28 DIAGNOSIS — M069 Rheumatoid arthritis, unspecified: Secondary | ICD-10-CM | POA: Diagnosis not present

## 2015-02-28 DIAGNOSIS — C775 Secondary and unspecified malignant neoplasm of intrapelvic lymph nodes: Secondary | ICD-10-CM | POA: Diagnosis not present

## 2015-02-28 DIAGNOSIS — Z87891 Personal history of nicotine dependence: Secondary | ICD-10-CM | POA: Diagnosis not present

## 2015-02-28 DIAGNOSIS — R0789 Other chest pain: Secondary | ICD-10-CM | POA: Diagnosis not present

## 2015-02-28 DIAGNOSIS — I252 Old myocardial infarction: Secondary | ICD-10-CM | POA: Diagnosis not present

## 2015-02-28 DIAGNOSIS — R42 Dizziness and giddiness: Secondary | ICD-10-CM | POA: Diagnosis not present

## 2015-02-28 DIAGNOSIS — R3916 Straining to void: Secondary | ICD-10-CM | POA: Diagnosis present

## 2015-02-28 DIAGNOSIS — R509 Fever, unspecified: Secondary | ICD-10-CM | POA: Diagnosis not present

## 2015-02-28 DIAGNOSIS — I2510 Atherosclerotic heart disease of native coronary artery without angina pectoris: Secondary | ICD-10-CM | POA: Diagnosis present

## 2015-02-28 DIAGNOSIS — R0781 Pleurodynia: Secondary | ICD-10-CM | POA: Diagnosis not present

## 2015-02-28 DIAGNOSIS — R3912 Poor urinary stream: Secondary | ICD-10-CM | POA: Diagnosis present

## 2015-02-28 DIAGNOSIS — R748 Abnormal levels of other serum enzymes: Secondary | ICD-10-CM | POA: Diagnosis not present

## 2015-02-28 DIAGNOSIS — C779 Secondary and unspecified malignant neoplasm of lymph node, unspecified: Secondary | ICD-10-CM | POA: Diagnosis not present

## 2015-02-28 DIAGNOSIS — Z8249 Family history of ischemic heart disease and other diseases of the circulatory system: Secondary | ICD-10-CM | POA: Diagnosis not present

## 2015-02-28 DIAGNOSIS — Z8546 Personal history of malignant neoplasm of prostate: Secondary | ICD-10-CM | POA: Diagnosis not present

## 2015-02-28 DIAGNOSIS — Z955 Presence of coronary angioplasty implant and graft: Secondary | ICD-10-CM | POA: Diagnosis not present

## 2015-02-28 HISTORY — DX: Cough: R05

## 2015-02-28 HISTORY — DX: Other specified cough: R05.8

## 2015-02-28 LAB — COMPREHENSIVE METABOLIC PANEL
ALBUMIN: 4.2 g/dL (ref 3.5–5.0)
ALK PHOS: 46 U/L (ref 38–126)
ALT: 13 U/L — AB (ref 17–63)
AST: 20 U/L (ref 15–41)
Anion gap: 6 (ref 5–15)
BILIRUBIN TOTAL: 0.6 mg/dL (ref 0.3–1.2)
BUN: 18 mg/dL (ref 6–20)
CALCIUM: 9.4 mg/dL (ref 8.9–10.3)
CO2: 27 mmol/L (ref 22–32)
Chloride: 107 mmol/L (ref 101–111)
Creatinine, Ser: 1.14 mg/dL (ref 0.61–1.24)
GFR calc Af Amer: >60 mL/min (ref >60)
GFR calc non Af Amer: >60 mL/min (ref >60)
GLUCOSE: 105 mg/dL — AB (ref 65–99)
Potassium: 4.4 mmol/L (ref 3.5–5.1)
Sodium: 140 mmol/L (ref 135–145)
TOTAL PROTEIN: 6.8 g/dL (ref 6.5–8.1)

## 2015-02-28 LAB — CBC
HEMATOCRIT: 46.6 % (ref 39.0–52.0)
HEMOGLOBIN: 15.1 g/dL (ref 13.0–17.0)
MCH: 30.3 pg (ref 26.0–34.0)
MCHC: 32.4 g/dL (ref 30.0–36.0)
MCV: 93.6 fL (ref 78.0–100.0)
Platelets: 247 10*3/uL (ref 150–400)
RBC: 4.98 MIL/uL (ref 4.22–5.81)
RDW: 13.1 % (ref 11.5–15.5)
WBC: 5.6 10*3/uL (ref 4.0–10.5)

## 2015-02-28 NOTE — Patient Instructions (Signed)
Jesse Mann  02/28/2015   Your procedure is scheduled on: 03-03-15 Friday  Report to [facility] Main  Entrance take [facility]  elevators to 3rd floor to  [place] at    1000 AM.  Call this number if you have problems the morning of surgery [phone]   Remember: ONLY 1 PERSON MAY GO WITH YOU TO SHORT STAY TO GET  READY MORNING OF [place].  Do not eat food or drink liquids :After Midnight.     Take these medicines the morning of surgery with A SIP OF WATER: Carvedilol . DO NOT TAKE ANY DIABETIC MEDICATIONS DAY OF YOUR SURGERY                               You may not have any metal on your body including hair pins and              piercings  Do not wear jewelry, make-up, lotions, powders or perfumes, deodorant             Do not wear nail polish.  Do not shave  48 hours prior to surgery.              Men may shave face and neck.   Do not bring valuables to the hospital. [place].  Contacts, dentures or bridgework may not be worn into surgery.  Leave suitcase in the car. After surgery it may be brought to your room.     Patients discharged the day of surgery will not be allowed to drive home.  Name and phone number of your driver: Lavonna Monarch- spouse [phone] cell  Special Instructions: N/A              Please read over the following fact sheets you were given: _____________________________________________________________________             [facility] - Preparing for Surgery Before surgery, you can play an important role.  Because skin is not sterile, your skin needs to be as free of germs as possible.  You can reduce the number of germs on your skin by washing with CHG (chlorahexidine gluconate) soap before surgery.  CHG is an antiseptic cleaner which kills germs and bonds with the skin to continue killing germs even after washing. Please DO NOT use if you have an allergy to CHG or  antibacterial soaps.  If your skin becomes reddened/irritated stop using the CHG and inform your nurse when you arrive at Short Stay. Do not shave (including legs and underarms) for at least 48 hours prior to the first CHG shower.  You may shave your face/neck. Please follow these instructions carefully:  1.  Shower with CHG Soap the night before surgery and the  morning of Surgery.  2.  If you choose to wash your hair, wash your hair first as usual with your  normal  shampoo.  3.  After you shampoo, rinse your hair and body thoroughly to remove the  shampoo.                           4.  Use CHG as you would any other liquid soap.  You can apply chg  directly  to the skin and wash                       Gently with a scrungie or clean washcloth.  5.  Apply the CHG Soap to your body ONLY FROM THE NECK DOWN.   Do not use on face/ open                           Wound or open sores. Avoid contact with eyes, ears mouth and genitals (private parts).                       Wash face,  Genitals (private parts) with your normal soap.             6.  Wash thoroughly, paying special attention to the area where your surgery  will be performed.  7.  Thoroughly rinse your body with warm water from the neck down.  8.  DO NOT shower/wash with your normal soap after using and rinsing off  the CHG Soap.                9.  Pat yourself dry with a clean towel.            10.  Wear clean pajamas.            11.  Place clean sheets on your bed the night of your first shower and do not  sleep with pets. Day of Surgery : Do not apply any lotions/deodorants the morning of surgery.  Please wear clean clothes to the hospital/surgery center.  FAILURE TO FOLLOW THESE INSTRUCTIONS MAY RESULT IN THE CANCELLATION OF YOUR SURGERY PATIENT SIGNATURE_________________________________  NURSE SIGNATURE__________________________________  ________________________________________________________________________

## 2015-02-28 NOTE — Pre-Procedure Instructions (Addendum)
02-21-15 EKG, Stress report with chart chart [facility]. Dr. Marcie Bal reviewed- states "needs cardiac clearance note from Dr. Geraldo Pitter". Darrel Reach of Dr. Carlton Adam office- reminded that this is needed with chart per phone message. 03-01-15 Clearance note from Dr. Revankar,cardiology with chart.

## 2015-03-02 LAB — URINE CULTURE: Culture: 2000

## 2015-03-03 ENCOUNTER — Inpatient Hospital Stay (HOSPITAL_COMMUNITY)
Admission: RE | Admit: 2015-03-03 | Discharge: 2015-03-05 | DRG: 707 | Disposition: A | Discharge status: Home / Self Care | Payer: Commercial Managed Care - HMO | Source: Ambulatory Visit | Attending: Urology | Admitting: Urology

## 2015-03-03 ENCOUNTER — Encounter (HOSPITAL_COMMUNITY): Payer: Self-pay | Admitting: *Deleted

## 2015-03-03 ENCOUNTER — Encounter (HOSPITAL_COMMUNITY)
Admission: RE | Disposition: A | Discharge status: Home / Self Care | Payer: Self-pay | Source: Ambulatory Visit | Attending: Urology

## 2015-03-03 ENCOUNTER — Inpatient Hospital Stay (HOSPITAL_COMMUNITY): Payer: Commercial Managed Care - HMO | Admitting: Certified Registered Nurse Anesthetist

## 2015-03-03 DIAGNOSIS — G4730 Sleep apnea, unspecified: Secondary | ICD-10-CM | POA: Diagnosis present

## 2015-03-03 DIAGNOSIS — M1990 Unspecified osteoarthritis, unspecified site: Secondary | ICD-10-CM | POA: Diagnosis present

## 2015-03-03 DIAGNOSIS — Z87891 Personal history of nicotine dependence: Secondary | ICD-10-CM

## 2015-03-03 DIAGNOSIS — I252 Old myocardial infarction: Secondary | ICD-10-CM | POA: Diagnosis not present

## 2015-03-03 DIAGNOSIS — R339 Retention of urine, unspecified: Secondary | ICD-10-CM | POA: Diagnosis present

## 2015-03-03 DIAGNOSIS — C775 Secondary and unspecified malignant neoplasm of intrapelvic lymph nodes: Secondary | ICD-10-CM | POA: Diagnosis present

## 2015-03-03 DIAGNOSIS — C61 Malignant neoplasm of prostate: Secondary | ICD-10-CM | POA: Diagnosis present

## 2015-03-03 DIAGNOSIS — Z8249 Family history of ischemic heart disease and other diseases of the circulatory system: Secondary | ICD-10-CM | POA: Diagnosis not present

## 2015-03-03 DIAGNOSIS — M069 Rheumatoid arthritis, unspecified: Secondary | ICD-10-CM | POA: Diagnosis present

## 2015-03-03 DIAGNOSIS — Z955 Presence of coronary angioplasty implant and graft: Secondary | ICD-10-CM | POA: Diagnosis not present

## 2015-03-03 DIAGNOSIS — I951 Orthostatic hypotension: Secondary | ICD-10-CM | POA: Diagnosis not present

## 2015-03-03 DIAGNOSIS — R42 Dizziness and giddiness: Secondary | ICD-10-CM | POA: Diagnosis not present

## 2015-03-03 DIAGNOSIS — I2510 Atherosclerotic heart disease of native coronary artery without angina pectoris: Secondary | ICD-10-CM | POA: Diagnosis present

## 2015-03-03 DIAGNOSIS — Z800 Family history of malignant neoplasm of digestive organs: Secondary | ICD-10-CM | POA: Diagnosis not present

## 2015-03-03 DIAGNOSIS — Z7982 Long term (current) use of aspirin: Secondary | ICD-10-CM | POA: Diagnosis not present

## 2015-03-03 DIAGNOSIS — R071 Chest pain on breathing: Secondary | ICD-10-CM | POA: Diagnosis not present

## 2015-03-03 DIAGNOSIS — C7982 Secondary malignant neoplasm of genital organs: Secondary | ICD-10-CM | POA: Diagnosis not present

## 2015-03-03 DIAGNOSIS — R3912 Poor urinary stream: Secondary | ICD-10-CM | POA: Diagnosis present

## 2015-03-03 DIAGNOSIS — N529 Male erectile dysfunction, unspecified: Secondary | ICD-10-CM | POA: Diagnosis present

## 2015-03-03 DIAGNOSIS — Z79899 Other long term (current) drug therapy: Secondary | ICD-10-CM

## 2015-03-03 DIAGNOSIS — R3916 Straining to void: Secondary | ICD-10-CM | POA: Diagnosis present

## 2015-03-03 HISTORY — PX: ROBOT ASSISTED LAPAROSCOPIC RADICAL PROSTATECTOMY: SHX5141

## 2015-03-03 HISTORY — PX: LYMPH NODE DISSECTION: SHX5087

## 2015-03-03 LAB — HEMOGLOBIN AND HEMATOCRIT, BLOOD
HCT: 35.5 % — ABNORMAL LOW (ref 39.0–52.0)
HEMOGLOBIN: 11.7 g/dL — AB (ref 13.0–17.0)

## 2015-03-03 LAB — ABO/RH: ABO/RH(D): O POS

## 2015-03-03 SURGERY — ROBOTIC ASSISTED LAPAROSCOPIC RADICAL PROSTATECTOMY
Anesthesia: General | Site: Abdomen | Laterality: Bilateral

## 2015-03-03 MED ORDER — PHENYLEPHRINE HCL 10 MG/ML IJ SOLN
INTRAMUSCULAR | Status: DC | PRN
  Administered 2015-03-03: 40 ug via INTRAVENOUS

## 2015-03-03 MED ORDER — MIDAZOLAM HCL 2 MG/2ML IJ SOLN
INTRAMUSCULAR | Status: AC
  Filled 2015-03-03: qty 2

## 2015-03-03 MED ORDER — SULFAMETHOXAZOLE-TRIMETHOPRIM 800-160 MG PO TABS: 1.0000 | ORAL_TABLET | Freq: Two times a day (BID) | ORAL | Status: DC

## 2015-03-03 MED ORDER — BUPIVACAINE LIPOSOME 1.3 % IJ SUSP
20.0000 mL | Freq: Once | INTRAMUSCULAR | Status: AC
  Administered 2015-03-03: 20 mL
  Filled 2015-03-03: qty 20

## 2015-03-03 MED ORDER — SIMVASTATIN 10 MG PO TABS
10.0000 mg | ORAL_TABLET | Freq: Every day | ORAL | Status: DC
  Administered 2015-03-04: 10 mg via ORAL
  Filled 2015-03-03: qty 1

## 2015-03-03 MED ORDER — ROCURONIUM BROMIDE 100 MG/10ML IV SOLN
INTRAVENOUS | Status: AC
  Filled 2015-03-03: qty 1

## 2015-03-03 MED ORDER — HYDROMORPHONE HCL 1 MG/ML IJ SOLN
INTRAMUSCULAR | Status: AC
  Filled 2015-03-03: qty 1

## 2015-03-03 MED ORDER — MIDAZOLAM HCL 5 MG/5ML IJ SOLN
INTRAMUSCULAR | Status: DC | PRN
  Administered 2015-03-03 (×2): 1 mg via INTRAVENOUS

## 2015-03-03 MED ORDER — OXYCODONE HCL 5 MG PO TABS
5.0000 mg | ORAL_TABLET | ORAL | Status: DC | PRN
  Administered 2015-03-05 (×2): 5 mg via ORAL
  Filled 2015-03-03 (×2): qty 1

## 2015-03-03 MED ORDER — PROPOFOL 10 MG/ML IV BOLUS
INTRAVENOUS | Status: AC
  Filled 2015-03-03: qty 20

## 2015-03-03 MED ORDER — LACTATED RINGERS IR SOLN
Status: DC | PRN
  Administered 2015-03-03: 1000 mL

## 2015-03-03 MED ORDER — LACTATED RINGERS IV SOLN
INTRAVENOUS | Status: DC | PRN
Start: 2015-03-03 — End: 2015-03-03
  Administered 2015-03-03: 13:00:00 via INTRAVENOUS

## 2015-03-03 MED ORDER — PROPOFOL 10 MG/ML IV BOLUS
INTRAVENOUS | Status: DC | PRN
  Administered 2015-03-03: 100 mg via INTRAVENOUS

## 2015-03-03 MED ORDER — LACTATED RINGERS IV SOLN
INTRAVENOUS | Status: DC | PRN
  Administered 2015-03-03 (×3): via INTRAVENOUS

## 2015-03-03 MED ORDER — FENTANYL CITRATE (PF) 100 MCG/2ML IJ SOLN: 25.0000 ug | INTRAMUSCULAR | Status: DC | PRN

## 2015-03-03 MED ORDER — PHENYLEPHRINE HCL 10 MG/ML IJ SOLN
10.0000 mg | INTRAMUSCULAR | Status: DC | PRN
  Administered 2015-03-03: 50 ug/min via INTRAVENOUS

## 2015-03-03 MED ORDER — KETOROLAC TROMETHAMINE 15 MG/ML IJ SOLN
15.0000 mg | Freq: Four times a day (QID) | INTRAMUSCULAR | Status: DC
  Administered 2015-03-03 – 2015-03-04 (×2): 15 mg via INTRAVENOUS
  Filled 2015-03-03 (×2): qty 1

## 2015-03-03 MED ORDER — FENTANYL CITRATE (PF) 100 MCG/2ML IJ SOLN
INTRAMUSCULAR | Status: DC | PRN
  Administered 2015-03-03: 200 ug via INTRAVENOUS
  Administered 2015-03-03: 100 ug via INTRAVENOUS
  Administered 2015-03-03: 50 ug via INTRAVENOUS

## 2015-03-03 MED ORDER — BUPIVACAINE-EPINEPHRINE (PF) 0.25% -1:200000 IJ SOLN
INTRAMUSCULAR | Status: AC
  Filled 2015-03-03: qty 30

## 2015-03-03 MED ORDER — DEXTROSE-NACL 5-0.45 % IV SOLN
INTRAVENOUS | Status: DC
  Administered 2015-03-03: 125 mL via INTRAVENOUS
  Administered 2015-03-04 – 2015-03-05 (×3): via INTRAVENOUS

## 2015-03-03 MED ORDER — DIPHENHYDRAMINE HCL 12.5 MG/5ML PO ELIX: 12.5000 mg | ORAL_SOLUTION | Freq: Four times a day (QID) | ORAL | Status: DC | PRN

## 2015-03-03 MED ORDER — OXYCODONE HCL 5 MG/5ML PO SOLN
5.0000 mg | Freq: Once | ORAL | Status: DC | PRN
  Filled 2015-03-03: qty 5

## 2015-03-03 MED ORDER — HYDROMORPHONE HCL 1 MG/ML IJ SOLN
0.2500 mg | INTRAMUSCULAR | Status: DC | PRN
  Administered 2015-03-03 (×4): 0.5 mg via INTRAVENOUS

## 2015-03-03 MED ORDER — DIPHENHYDRAMINE HCL 50 MG/ML IJ SOLN: 12.5000 mg | Freq: Four times a day (QID) | INTRAMUSCULAR | Status: DC | PRN

## 2015-03-03 MED ORDER — CEFAZOLIN SODIUM-DEXTROSE 2-3 GM-% IV SOLR
2.0000 g | INTRAVENOUS | Status: AC
  Administered 2015-03-03: 2 g via INTRAVENOUS

## 2015-03-03 MED ORDER — PHENYLEPHRINE 40 MCG/ML (10ML) SYRINGE FOR IV PUSH (FOR BLOOD PRESSURE SUPPORT)
PREFILLED_SYRINGE | INTRAVENOUS | Status: AC
  Filled 2015-03-03: qty 10

## 2015-03-03 MED ORDER — HYDROCODONE-ACETAMINOPHEN 5-325 MG PO TABS: 1.0000 | ORAL_TABLET | Freq: Four times a day (QID) | ORAL | Status: DC | PRN

## 2015-03-03 MED ORDER — ONDANSETRON HCL 4 MG/2ML IJ SOLN
4.0000 mg | INTRAMUSCULAR | Status: DC | PRN
  Administered 2015-03-03 – 2015-03-04 (×3): 4 mg via INTRAVENOUS
  Filled 2015-03-03 (×4): qty 2

## 2015-03-03 MED ORDER — SODIUM CHLORIDE 0.9 % IJ SOLN
INTRAMUSCULAR | Status: AC
  Filled 2015-03-03: qty 10

## 2015-03-03 MED ORDER — FENTANYL CITRATE (PF) 100 MCG/2ML IJ SOLN
INTRAMUSCULAR | Status: AC
  Filled 2015-03-03: qty 2

## 2015-03-03 MED ORDER — ACETAMINOPHEN 10 MG/ML IV SOLN
1000.0000 mg | Freq: Four times a day (QID) | INTRAVENOUS | Status: AC
  Administered 2015-03-04 (×3): 1000 mg via INTRAVENOUS
  Filled 2015-03-03 (×3): qty 100

## 2015-03-03 MED ORDER — EPHEDRINE SULFATE 50 MG/ML IJ SOLN
INTRAMUSCULAR | Status: DC | PRN
  Administered 2015-03-03: 10 mg via INTRAVENOUS

## 2015-03-03 MED ORDER — ACETAMINOPHEN 160 MG/5ML PO SOLN: 650.0000 mg | Freq: Once | ORAL | Status: DC

## 2015-03-03 MED ORDER — OXYCODONE HCL 5 MG PO TABS: 5.0000 mg | ORAL_TABLET | Freq: Once | ORAL | Status: DC | PRN

## 2015-03-03 MED ORDER — ROCURONIUM BROMIDE 100 MG/10ML IV SOLN
INTRAVENOUS | Status: DC | PRN
  Administered 2015-03-03: 20 mg via INTRAVENOUS
  Administered 2015-03-03 (×2): 10 mg via INTRAVENOUS
  Administered 2015-03-03: 20 mg via INTRAVENOUS
  Administered 2015-03-03 (×2): 10 mg via INTRAVENOUS
  Administered 2015-03-03: 40 mg via INTRAVENOUS

## 2015-03-03 MED ORDER — CEFAZOLIN SODIUM-DEXTROSE 2-3 GM-% IV SOLR
INTRAVENOUS | Status: AC
  Filled 2015-03-03: qty 50

## 2015-03-03 MED ORDER — EPHEDRINE SULFATE 50 MG/ML IJ SOLN
INTRAMUSCULAR | Status: AC
  Filled 2015-03-03: qty 1

## 2015-03-03 MED ORDER — ASPIRIN EC 81 MG PO TBEC
81.0000 mg | DELAYED_RELEASE_TABLET | Freq: Once | ORAL | Status: AC
  Administered 2015-03-03: 81 mg via ORAL
  Filled 2015-03-03: qty 1

## 2015-03-03 MED ORDER — LIDOCAINE HCL (CARDIAC) 20 MG/ML IV SOLN
INTRAVENOUS | Status: DC | PRN
  Administered 2015-03-03: 60 mg via INTRAVENOUS

## 2015-03-03 MED ORDER — LIDOCAINE HCL (CARDIAC) 20 MG/ML IV SOLN
INTRAVENOUS | Status: AC
  Filled 2015-03-03: qty 5

## 2015-03-03 MED ORDER — SUGAMMADEX SODIUM 200 MG/2ML IV SOLN
INTRAVENOUS | Status: AC
  Filled 2015-03-03: qty 2

## 2015-03-03 MED ORDER — PROMETHAZINE HCL 25 MG/ML IJ SOLN: 6.2500 mg | INTRAMUSCULAR | Status: DC | PRN

## 2015-03-03 MED ORDER — BUPIVACAINE-EPINEPHRINE 0.25% -1:200000 IJ SOLN
INTRAMUSCULAR | Status: DC | PRN
  Administered 2015-03-03: 10 mL

## 2015-03-03 MED ORDER — SODIUM CHLORIDE 0.9 % IJ SOLN
INTRAMUSCULAR | Status: AC
  Filled 2015-03-03: qty 50

## 2015-03-03 MED ORDER — CARVEDILOL 3.125 MG PO TABS
3.1250 mg | ORAL_TABLET | Freq: Two times a day (BID) | ORAL | Status: DC
  Filled 2015-03-03: qty 1

## 2015-03-03 MED ORDER — SODIUM CHLORIDE 0.9 % IV BOLUS (SEPSIS)
1000.0000 mL | Freq: Once | INTRAVENOUS | Status: AC
  Administered 2015-03-03: 1000 mL via INTRAVENOUS

## 2015-03-03 MED ORDER — ONDANSETRON HCL 4 MG/2ML IJ SOLN
INTRAMUSCULAR | Status: AC
  Filled 2015-03-03: qty 2

## 2015-03-03 MED ORDER — PHENYLEPHRINE HCL 10 MG/ML IJ SOLN
INTRAMUSCULAR | Status: AC
  Filled 2015-03-03: qty 1

## 2015-03-03 MED ORDER — FENTANYL CITRATE (PF) 250 MCG/5ML IJ SOLN
INTRAMUSCULAR | Status: AC
  Filled 2015-03-03: qty 5

## 2015-03-03 MED ORDER — ONDANSETRON HCL 4 MG/2ML IJ SOLN
INTRAMUSCULAR | Status: DC | PRN
  Administered 2015-03-03: 4 mg via INTRAVENOUS

## 2015-03-03 MED ORDER — SUCCINYLCHOLINE CHLORIDE 20 MG/ML IJ SOLN
INTRAMUSCULAR | Status: DC | PRN
  Administered 2015-03-03: 100 mg via INTRAVENOUS

## 2015-03-03 MED ORDER — NITROGLYCERIN 0.4 MG SL SUBL: 0.4000 mg | SUBLINGUAL_TABLET | SUBLINGUAL | Status: DC | PRN

## 2015-03-03 MED ORDER — SUGAMMADEX SODIUM 200 MG/2ML IV SOLN
INTRAVENOUS | Status: DC | PRN
  Administered 2015-03-03: 200 mg via INTRAVENOUS

## 2015-03-03 MED ORDER — HYDROMORPHONE HCL 1 MG/ML IJ SOLN: 0.5000 mg | INTRAMUSCULAR | Status: DC | PRN

## 2015-03-03 SURGICAL SUPPLY — 52 items
APPLICATOR ARISTA FLEXITIP XL (MISCELLANEOUS) ×3 IMPLANT
CATH FOLEY 2WAY SLVR 18FR 30CC (CATHETERS) ×3 IMPLANT
CATH TIEMANN FOLEY 18FR 5CC (CATHETERS) ×3 IMPLANT
CHLORAPREP W/TINT 26ML (MISCELLANEOUS) ×3 IMPLANT
CLIP LIGATING HEM O LOK PURPLE (MISCELLANEOUS) ×6 IMPLANT
CONT SPECI 4OZ STER CLIK (MISCELLANEOUS) ×3 IMPLANT
COVER SURGICAL LIGHT HANDLE (MISCELLANEOUS) ×3 IMPLANT
COVER TIP SHEARS 8 DVNC (MISCELLANEOUS) ×1 IMPLANT
COVER TIP SHEARS 8MM DA VINCI (MISCELLANEOUS) ×2
CUTTER ECHEON FLEX ENDO 45 340 (ENDOMECHANICALS) ×3 IMPLANT
DECANTER SPIKE VIAL GLASS SM (MISCELLANEOUS) ×3 IMPLANT
DRAPE ARM DVNC X/XI (DISPOSABLE) ×4 IMPLANT
DRAPE COLUMN DVNC XI (DISPOSABLE) ×1 IMPLANT
DRAPE DA VINCI XI ARM (DISPOSABLE) ×8
DRAPE DA VINCI XI COLUMN (DISPOSABLE) ×2
DRAPE SURG IRRIG POUCH 19X23 (DRAPES) ×3 IMPLANT
DRSG TEGADERM 4X4.75 (GAUZE/BANDAGES/DRESSINGS) IMPLANT
ELECT REM PT RETURN 9FT ADLT (ELECTROSURGICAL) ×3
ELECTRODE REM PT RTRN 9FT ADLT (ELECTROSURGICAL) ×1 IMPLANT
GAUZE SPONGE 2X2 8PLY STRL LF (GAUZE/BANDAGES/DRESSINGS) IMPLANT
GLOVE BIO SURGEON STRL SZ 6.5 (GLOVE) ×2 IMPLANT
GLOVE BIO SURGEONS STRL SZ 6.5 (GLOVE) ×1
GLOVE BIOGEL M STRL SZ7.5 (GLOVE) ×6 IMPLANT
GOWN STRL REUS W/TWL LRG LVL3 (GOWN DISPOSABLE) ×3 IMPLANT
GOWN STRL REUS W/TWL XL LVL3 (GOWN DISPOSABLE) ×6 IMPLANT
HEMOSTAT ARISTA ABSORB 3G PWDR (MISCELLANEOUS) ×3 IMPLANT
HEMOSTAT SURGICEL 2X14 (HEMOSTASIS) ×3 IMPLANT
HOLDER FOLEY CATH W/STRAP (MISCELLANEOUS) IMPLANT
IV LACTATED RINGERS 1000ML (IV SOLUTION) ×3 IMPLANT
LIQUID BAND (GAUZE/BANDAGES/DRESSINGS) ×3 IMPLANT
PACK ROBOT UROLOGY CUSTOM (CUSTOM PROCEDURE TRAY) ×3 IMPLANT
PAD POSITIONING PINK XL (MISCELLANEOUS) IMPLANT
RELOAD GREEN ECHELON 45 (STAPLE) ×3 IMPLANT
SEAL CANN UNIV 5-8 DVNC XI (MISCELLANEOUS) ×4 IMPLANT
SEAL XI 5MM-8MM UNIVERSAL (MISCELLANEOUS) ×8
SEALER TISSUE G2 CVD JAW 45CM (ENDOMECHANICALS) ×3 IMPLANT
SET TUBE IRRIG SUCTION NO TIP (IRRIGATION / IRRIGATOR) ×3 IMPLANT
SOLUTION ELECTROLUBE (MISCELLANEOUS) ×3 IMPLANT
SPONGE GAUZE 2X2 STER 10/PKG (GAUZE/BANDAGES/DRESSINGS)
SUT ETHILON 3 0 PS 1 (SUTURE) ×3 IMPLANT
SUT MNCRL AB 4-0 PS2 18 (SUTURE) ×6 IMPLANT
SUT VIC AB 0 CT1 27 (SUTURE) ×2
SUT VIC AB 0 CT1 27XBRD ANTBC (SUTURE) ×1 IMPLANT
SUT VIC AB 2-0 CT2 27 (SUTURE) ×3 IMPLANT
SUT VIC AB 2-0 SH 27 (SUTURE) ×2
SUT VIC AB 2-0 SH 27X BRD (SUTURE) ×1 IMPLANT
SUT VICRYL 0 UR6 27IN ABS (SUTURE) ×6 IMPLANT
SUT VLOC BARB 180 ABS3/0GR12 (SUTURE) ×6
SUTURE VLOC BRB 180 ABS3/0GR12 (SUTURE) ×2 IMPLANT
TAPE STRIPS DRAPE STRL (GAUZE/BANDAGES/DRESSINGS) ×3 IMPLANT
TOWEL OR NON WOVEN STRL DISP B (DISPOSABLE) ×3 IMPLANT
WATER STERILE IRR 1500ML POUR (IV SOLUTION) ×3 IMPLANT

## 2015-03-03 NOTE — Interval H&P Note (Signed)
History and Physical Interval Note:  03/03/2015 11:53 AM  Jesse Mann  has presented today for surgery, with the diagnosis of PROSTATE CANCER   The various methods of treatment have been discussed with the patient and family. After consideration of risks, benefits and other options for treatment, the patient has consented to  Procedure(s): XI ROBOTIC [place] (Bilateral) BILATERAL PELVIC LYMPH NODE DISSECTION (Bilateral) as a surgical intervention .  The patient's history has been reviewed, patient examined, no change in status, stable for surgery.  I have reviewed the patient's chart and labs.  Questions were answered to the patient's satisfaction.     [name]

## 2015-03-03 NOTE — H&P (Signed)
Chief Complaint high risk prostate cancer.  History of Present Illness  Jesse Mann is a 75 year old gentleman with a history of myocardial infarction due to coronary artery disease s/p cardiac stent placement in 2014 (on ASA 81 mg) and rheumatoid arthritis.  He was seen by Dr. Louis Meckel initially in October when his primary care physician had noted his PSA to be elevated at 10.1 and the patient was complaining of increasingly bothersome urinary symptoms.  He was noted to have an abnormal DRE with nodularity at the left mid prostate and his repeat PSA was 12.22.  He eventually underwent a TRUS biopsy of the prostate on 12/20/14 which confirmed Gleason 5+4=9 adenocarcinoma of the prostate with 9 out 12 biopsy cores positive for malignancy including high volume percentages in all of the left sided cores. He was noted to have a prominent median prostate lobe. Staging studies were performed on 01/16/15 including a CT scan of the pelvis and a bone scan. His CT scan was negative for metastatic disease and his bone scan demonstrated increased activity in two foci in the face/skull that are felt to be unlikely to represent metastatic disease.  Further imaging was not recommended by radiology at the [facility] conference today.  He has undergone bilateral hernia repairs.  TNM stage: cT3a N M  PSA: 12.22 Gleason score: 5+4=9 Biopsy (12/20/14): 9/12 cores positive    Left: L lateral apex (90%, 4+5=9), L apex (70%, 4+3=7, PNI), L lateral mid (95%, 5+3=8), L mid (90%, 5+4=9, PNI), L lateral base (95%, 4+4=8), L base (90%, 5+4=9)   Right: R lateral apex (10%, 4+3=7), R mid (50%, 4+5=9), R lateral base (< 1%, 4+4=8) Prostate volume: 47.8 cc, median lobe  Urinary function: He has significant LUTS including incomplete bladder emptying, intermittency, weak stream, and straining to void.  He is not on medical therapy and he is not overly bothered by his symptoms. Erectile function:  He has severe erectile dysfunction and is not  sexually active.  SHIM score is 1.  This is not a priority to him.   Past Medical History  1. History of arthritis (Z87.39)  2. History of cardiac arrhythmia (Z86.79)  3. History of heartburn (Z87.898)  4. History of myocardial infarction (I25.2)  5. History of sleep apnea (Z86.69)  Surgical History  1. History of Cath Stent Placement  2. History of Inguinal Hernia Repair  3. History of Tonsillectomy  Current Meds  1. Aspirin 81 MG TABS;  Therapy: (Recorded:30Sep2016) to Recorded  2. Carvedilol 3.125 MG Oral Tablet;  Therapy: (Recorded:30Sep2016) to Recorded  3. Probiotic CAPS;  Therapy: (Recorded:30Sep2016) to Recorded  4. Simvastatin 10 MG Oral Tablet;  Therapy: (Recorded:30Sep2016) to Recorded  Allergies  1. No Known Drug Allergies  Family History  1. Family history of Death of family member : Mother, Father  2. Family history of colon cancer (Z80.0) : Father  3. Family history of heart failure (Z82.49) : Mother  Social History   Denied: History of Alcohol use   Former smoker [phone])   Married   Number of children   Retired  Review of Systems AU Complete-Male: Constitutional, skin, eye, otolaryngeal, hematologic/lymphatic, cardiovascular, pulmonary, endocrine, musculoskeletal, gastrointestinal, neurological and psychiatric system(s) were reviewed and pertinent findings if present are noted and are otherwise negative.  Constitutional: feeling tired (fatigue).  ENT: sinus problems.  Respiratory: cough.  Musculoskeletal: back pain.    Physical Exam Constitutional: Well nourished and well developed . No acute distress.  Non-labored breathing Heart rate is regular  Ext symmetric   Results/Data  I have independently reviewed his medical records, PSA results, pathology slides, bone scan, and CT scan today.  Findings are as described above.     Assessment  1. Prostate cancer ([place])  Discussion/Summary   1. High risk locally advanced prostate cancer: I had a  detailed discussion with Mr. Baumel, his wife, and his daughter today.  We discussed the fact that he appears to have a high grade, locally advanced prostate cancer without evidence of measurable metastatic disease.  However, he understands the high risk of potentially having micrometastatic disease and need for multimodality therapy to offer him the best chance at curative treatment.  We discussed options for treatment today including primary surgical therapy with the likely need for adjuvant therapy in the form of radiation (+/- ADT) vs primary radiation therapy with long term ADT.  We spent the majority of our discussion reviewing the pros and cons of these treatment options for him.   Mr. Dahman understands the potential benefits of surgery with regard to his significant LUTS and prominent prostate median lobe.  However, he also understands the potential negative aspects of risk of major surgery with a history of CAD (he would need cardiac risk assessment from his cardiologist in [facility]) and the risk of incontinence at age 27 as well as the high chance he would still require adjuvant radiation therapy.  He understands the potential benefits of radiation therapy with regard to avoiding the morbidity and recovery with surgery but with potential to exacerbate his voiding symptoms and the need for long term ADT.   The patient was counseled about the natural history of prostate cancer and the standard treatment options that are available for prostate cancer. It was explained to him how his age and life expectancy, clinical stage, Gleason score, and PSA affect his prognosis, the decision to proceed with additional staging studies, as well as how that information influences recommended treatment strategies. We discussed the roles for active surveillance, radiation therapy, surgical therapy, androgen deprivation, as well as ablative therapy options for the treatment of prostate cancer as appropriate to his  individual cancer situation. We discussed the risks and benefits of these options with regard to their impact on cancer control and also in terms of potential adverse events, complications, and impact on quiality of life particularly related to urinary, bowel, and sexual function. The patient was encouraged to ask questions throughout the discussion today and all questions were answered to his stated satisfaction. In addition, the patient was provided with and/or directed to appropriate resources and literature for further education about prostate cancer and treatment options.   We discussed surgical therapy for prostate cancer including the different available surgical approaches. We discussed, in detail, the risks and expectations of surgery with regard to cancer control, urinary control, and erectile function as well as the expected postoperative recovery process. Additional risks of surgery including but not limited to bleeding, infection, hernia formation, nerve damage, lymphocele formation, bowel/rectal injury potentially necessitating colostomy, damage to the urinary tract resulting in urine leakage, urethral stricture, and the cardiopulmonary risks such as myocardial infarction, stroke, death, venothromboembolism, etc. were explained. The risk of open surgical conversion for robotic/laparoscopic prostatectomy was also discussed.   After a thoughtful discussion, Mr. Truong adamantly wishes to proceed with primary surgical treatment.  I recommended that he get cardiac clearance prior to surgery and he understands that he should continue his aspirin 81 mg perioperatively with his history of cardiac stent placement.  I will notify Dr. Louis Meckel of Mr. Mershon decision.

## 2015-03-03 NOTE — Interval H&P Note (Signed)
History and Physical Interval Note:  03/03/2015 11:51 AM  Jesse Mann  has presented today for surgery, with the diagnosis of PROSTATE CANCER   The various methods of treatment have been discussed with the patient and family. After consideration of risks, benefits and other options for treatment, the patient has consented to  Procedure(s): XI ROBOTIC [place] (Bilateral) BILATERAL PELVIC LYMPH NODE DISSECTION (Bilateral) as a surgical intervention .  The patient's history has been reviewed, patient examined, no change in status, stable for surgery.  I have reviewed the patient's chart and labs.  Questions were answered to the patient's satisfaction.     [name]

## 2015-03-03 NOTE — Anesthesia Procedure Notes (Addendum)
Procedure Name: Intubation Date/Time: 03/03/2015 1:04 PM Performed by: Deliah Boston Pre-anesthesia Checklist: Patient identified, Emergency Drugs available, Suction available and Patient being monitored Patient Re-evaluated:Patient Re-evaluated prior to inductionOxygen Delivery Method: Circle System Utilized Preoxygenation: Pre-oxygenation with 100% oxygen Intubation Type: IV induction Ventilation: Mask ventilation without difficulty Laryngoscope Size: Mac and 4 Grade View: Grade I Tube type: Oral Tube size: 8.0 mm Number of attempts: 1 Airway Equipment and Method: Stylet and Oral airway Placement Confirmation: ETT inserted through vocal cords under direct vision,  positive ETCO2 and breath sounds checked- equal and bilateral Secured at: 23 cm Tube secured with: Tape Dental Injury: Teeth and Oropharynx as per pre-operative assessment  Comments: Intubation by Victorino December SRNA

## 2015-03-03 NOTE — Discharge Instructions (Signed)
Activity:  You are encouraged to ambulate frequently (about every hour during waking hours) to help prevent blood clots from forming in your legs or lungs.  However, you should not engage in any heavy lifting (> 10-15 lbs), strenuous activity, or straining.  Diet: You should advance your diet as instructed by your physician.  It will be normal to have some bloating, nausea, and abdominal discomfort intermittently.  Prescriptions:  You will be provided a prescription for pain medication to take as needed.  If your pain is not severe enough to require the prescription pain medication, you may take extra strength Tylenol instead which will have less side effects.  You should also take a prescribed stool softener to avoid straining with bowel movements as the prescription pain medication may constipate you.  Incisions: You may remove your dressing bandages 48 hours after surgery if not removed in the hospital.  You will either have some small staples or special tissue glue at each of the incision sites. Once the bandages are removed (if present), the incisions may stay open to air.  You may start showering (but not soaking or bathing in water) the 2nd day after surgery and the incisions simply need to be patted dry after the shower.  No additional care is needed.  What to call us about: You should call the office ([phone]) if you develop fever > 101 or develop persistent vomiting. Activity:  You are encouraged to ambulate frequently (about every hour during waking hours) to help prevent blood clots from forming in your legs or lungs.  However, you should not engage in any heavy lifting (> 10-15 lbs), strenuous activity, or straining.    Foley Catheter Care A soft, flexible tube (Foley catheter) may have been placed in your bladder to drain urine and fluid. Follow these instructions: Taking Care of the Catheter Keep the area where the catheter leaves your body clean.  Attach the catheter to the leg so  there is no tension on the catheter.  Keep the drainage bag below the level of the bladder, but keep it OFF the floor.  Do not take long soaking baths. Your caregiver will give instructions about showering.  Wash your hands before touching ANYTHING related to the catheter or bag.  Using mild soap and warm water on a washcloth:  Clean the area closest to the catheter insertion site using a circular motion around the catheter.  Clean the catheter itself by wiping AWAY from the insertion site for several inches down the tube.  NEVER wipe upward as this could sweep bacteria up into the urethra (tube in your body that normally drains the bladder) and cause infection.  Place a small amount of sterile lubricant at the tip of the penis where the catheter is entering.  Taking Care of the Drainage Bags Two drainage bags may be taken home: a large overnight drainage bag, and a smaller leg bag which fits underneath clothing.  It is okay to wear the overnight bag at any time, but NEVER wear the smaller leg bag at night.  Keep the drainage bag well below the level of your bladder. This prevents backflow of urine into the bladder and allows the urine to drain freely.  Anchor the tubing to your leg to prevent pulling or tension on the catheter. Use tape or a leg strap provided by the hospital.  Empty the drainage bag when it is 1/2 to 3/4 full. Wash your hands before and after touching the bag.  Periodically   check the tubing for kinks to make sure there is no pressure on the tubing which could restrict the flow of urine.  Changing the Drainage Bags Cleanse both ends of the clean bag with alcohol before changing.  Pinch off the rubber catheter to avoid urine spillage during the disconnection.  Disconnect the dirty bag and connect the clean one.  Empty the dirty bag carefully to avoid a urine spill.  Attach the new bag to the leg with tape or a leg strap.  Cleaning the Drainage Bags Whenever a drainage bag is  disconnected, it must be cleaned quickly so it is ready for the next use.  Wash the bag in warm, soapy water.  Rinse the bag thoroughly with warm water.  Soak the bag for 30 minutes in a solution of white vinegar and water (1 cup vinegar to 1 quart warm water).  Rinse with warm water.  SEEK MEDICAL CARE IF:  You have chills or night sweats.  You are leaking around your catheter or have problems with your catheter. It is not uncommon to have sporadic leakage around your catheter as a result of bladder spasms. If the leakage stops, there is not much need for concern. If you are uncertain, call your caregiver.  You develop side effects that you think are coming from your medicines.  SEEK IMMEDIATE MEDICAL CARE IF:  You are suddenly unable to urinate. Check to see if there are any kinks in the drainage tubing that may cause this. If you cannot find any kinks, call your caregiver immediately. This is an emergency.  You develop shortness of breath or chest pains.  Bleeding persists or clots develop in your urine.  You have a fever.  You develop pain in your back or over your lower belly (abdomen).  You develop pain or swelling in your legs.  Any problems you are having get worse rather than better.  MAKE SURE YOU:  Understand these instructions.  Will watch your condition.  Will get help right away if you are not doing well or get worse.   

## 2015-03-03 NOTE — Transfer of Care (Signed)
Immediate Anesthesia Transfer of Care Note  Patient: Jesse Mann  Procedure(s) Performed: Procedure(s): XI ROBOTIC ASSISTED LAPAROSCOPIC RADICAL PROSTATECTOMY (Bilateral) BILATERAL PELVIC LYMPH NODE DISSECTION (Bilateral)  Patient Location: PACU  Anesthesia Type:General  Level of Consciousness: sedated  Airway & Oxygen Therapy: Patient Spontanous Breathing and Patient connected to face mask oxygen  Post-op Assessment: Report given to RN and Post -op Vital signs reviewed and stable  Post vital signs: Reviewed and stable  Last Vitals:  Filed Vitals:   03/03/15 1002  BP: 123/74  Pulse: 68  Temp: 36.8 C  Resp: 18    Complications: No apparent anesthesia complications

## 2015-03-03 NOTE — Op Note (Signed)
Preoperative diagnosis:  1. Prostate Cancer   Postoperative diagnosis:  1. same   Procedure: 1. Robotic assisted laparoscopic radical prostatectomy 2. Extended bilateral pelvic lymph node dissection  Surgeon: Ardis Hughs, MD First Assistant: Debbrah Alar, PA  Anesthesia: General  Complications: None  Intraoperative findings: Non-nerve spare, tight anastomosis, extended lymph node dissection  EBL: 400  Specimens:  #1.  Prostate and seminal vesicals #2.  Bilateral pelvic lymph nodes  Indication: Jesse Mann is a 75 y.o. patient with prostate cancer.  After reviewing the management options for treatment, he elected to proceed with the removal of his prostate. We have discussed the potential benefits and risks of the procedure, side effects of the proposed treatment, the likelihood of the patient achieving the goals of the procedure, and any potential problems that might occur during the procedure or recuperation. Informed consent has been obtained.  Description of procedure:  The patient was consented in the preoperative holding area. He is in brought back to the operating room placed the table in supine position. General anesthesia was then induced and endotracheal tube was inserted. He was then placed in dorsolithotomy position and placed in steep Trendelenburg. He was then prepped and draped in the routine sterile fashion. We then began by making a 8 mm incision supraumbilical midline incision the skin down through into the peritoneum. Then placed a 13mm trocar. I then inflated the abdomen and inserted the 0 robotic lens. We then placed 2 additional a 8 millimeter trochars in the patient's left lower abdomen proximally 9 cm apart and 2 trochars on the patient's right lower abdomen, one was an 8 mm trocar and the one most lateral was a 12 mm trocar which was used as the assistant port. A 5 mm trocar was placed by triangulating the 2 right lateral ports as a second assistant  port. These ports were all placed under visual guidance. Once the ports were noted to be satisfactory position the robot was docked. We started with the 0 lens, monopolar scissors in the right hand and the [place] forceps the left hand as well as a fenestrated grasper as the third arm on the left-hand side.   We began our dissection the posterior plane incising the peritoneum at the level of the vas deferens. Isolated the left vas deferens and dissected it proximally towards the spermatic cord for 5 cm prior to ligating it. Then used this as traction to isolate the left the seminal vesicle which was then undressed bluntly and completely dissected out, all vessels were cauterized with a combination of bipolar and the monopolar scissors. We then turned our attention to the right side and similarly dissected out the right vas deferens and seminal vesicle. Once the SVs had been freed, we turned our attention to the posterior plane and bluntly dissected the tissue between the rectum and the posterior wall of the prostate bluntly out towards the apex.    At this point the bladder was taken down starting at the urachal remnant with a combination of both blunt dissection and sharp dissection using monopolar cautery the bladder was dropped down in the usual fashion to the medial umbilical ligaments laterally and the dorsal vein of the prostate anteriorly creating our space of Retzius. We then turned our attention to the endopelvic fascia which was incised laterally starting on the patient's right-hand side the levator muscles were pushed off the prostate laterally up towards the dorsal vein complex on the right-hand side. This process was then repeated on  the left-hand side and a nice notch was created for the dorsal vein. I then used a 76mm stapler to staple the dorsal vein.  I needed to sew the dorsal vein because our staple line was inadequate.  I did this with a 2-0 vicryl.  We then located the bladder neck at  the vesicoprostatic junction and using the monopolar scissors dissected down through the perivesical tissues and the bladder neck down to the prostatic urethra. The catheter was then deflated and pulled through our urethral opening and then used to retract the prostate anteriorly for the posterior bladder neck dissection. Once through the bladder neck and into the posterior plane of the prostate, the SVs were brought through the opening. The left pedicle was then isolated and systematically ligated with the Endo-seal. This was then repeated on the right side.    I then came down through the dorsal venous complex anteriorly down to the membranous urethra using the monopolar. Once down to the urethra, it was transected sharply and the apex of the prostate was then dissected off the levator and rectourethralis muscles. Once the apex of the prostate had been dissected free we came back to the base of the prostate and bluntly push the rectum and nerve vascular bundle off the prostate the patient's left and used clips on the patient's right to free the prostate. Once the prostate was free it was placed off to the side. The pelvis was then irrigated with normal saline and noted to be relatively hemostatic.  Attention was then turned to the right pelvic sidewall. The fibrofatty tissue between the external iliac vein, confluence of the iliac vessels, hypogastric artery, and Cooper's ligament was dissected free from the pelvic sidewall with care to preserve the obturator nerve. Weck clips were used for lymphostasis and hemostasis. An identical procedure was performed on the contralateral side and the lymphatic packets were removed for permanent pathologic analysis.  The prostate and both lymph node tissues were placed in the Endo Catch bag and the string brought to the 5 mm port.    The vesicourethral anastomosis was then completed with 2 interlocking 3-0 V. lock sutures running the anastomosis in the 6:00 position to  the 12:00 position on each side and then tying it off on the top. The final catheter was then passed through the patient's urethra and into the bladder and 120 cc was instilled into the bladder to test the anastomosis. As there was no leak a 37 [place] Blake drain was passed through the left lateral port and placed around the vesicourethral anastomosis. A 12 mm assistant port on the right lateral side was then closed with 0 Vicryl with the help of the [employer] needle. The 12 mm midline infraumbilical incision was then extended another centimeter taken down and the fascia opened to remove the Endo Catch bag with the prostate specimen. The fascia was then closed with a 0 Vicryl and all skin ports were closed with 4-0 Monocryl in a subcutaneous fashion. Dermabond glue was then applied to the incisions. The drain was then secured to the skin with a 0 nylon stitch and dressing applied.   At the end of the case all laps needles and sponges had been accounted for. There no immediate complications. The patient returned to the PACU in stable condition.

## 2015-03-03 NOTE — Anesthesia Postprocedure Evaluation (Signed)
Anesthesia Post Note  Patient: Jesse Mann  Procedure(s) Performed: Procedure(s) (LRB): XI ROBOTIC ASSISTED LAPAROSCOPIC RADICAL PROSTATECTOMY (Bilateral) BILATERAL PELVIC LYMPH NODE DISSECTION (Bilateral)  Patient location during evaluation: PACU Anesthesia Type: General Level of consciousness: awake and alert and patient cooperative Pain management: pain level controlled Vital Signs Assessment: post-procedure vital signs reviewed and stable Respiratory status: spontaneous breathing and respiratory function stable Cardiovascular status: stable Anesthetic complications: no    Last Vitals:  Filed Vitals:   03/03/15 1858 03/03/15 2042  BP: 129/63 109/63  Pulse: 74 75  Temp: 36.6 C 36.4 C  Resp: 16 18    Last Pain:  Filed Vitals:   03/03/15 2047  PainSc: [place]

## 2015-03-03 NOTE — Anesthesia Preprocedure Evaluation (Addendum)
Anesthesia Evaluation  Patient identified by MRN, date of birth, ID band Patient awake    Reviewed: Allergy & Precautions, H&P , Patient's Chart, lab work & pertinent test results, reviewed documented beta blocker date and time   Airway Mallampati: II  TM Distance: >3 FB Neck ROM: full    Dental no notable dental hx. (+) Edentulous Upper   Pulmonary former smoker,    Pulmonary exam normal breath sounds clear to auscultation       Cardiovascular + Past MI  + Valvular Problems/Murmurs AI  Rhythm:regular Rate:Normal     Neuro/Psych    GI/Hepatic GERD  ,  Endo/Other    Renal/GU      Musculoskeletal   Abdominal   Peds  Hematology   Anesthesia Other Findings GERD   Sleep apnea... 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 He mows grass and rakes leaves and walks 15" on tredamill at reasonable pace. Repeat LV asesment w/ Echo shows EF of 37% and 2+ MR/ 1+AI    Cardiac arrhythmia.... No sx w/ BBlocker        Reproductive/Obstetrics                           Anesthesia Physical Anesthesia Plan  ASA: II  Anesthesia Plan: General   Post-op Pain Management:    Induction: Intravenous  Airway Management Planned: Oral ETT  Additional Equipment: Arterial line  Intra-op Plan:   Post-operative Plan: Extubation in OR  Informed Consent: I have reviewed the patients History and Physical, chart, labs and discussed the procedure including the risks, benefits and alternatives for the proposed anesthesia with the patient or authorized representative who has indicated his/her understanding and acceptance.   Dental Advisory Given and Dental advisory given  Plan Discussed with: CRNA and Surgeon  Anesthesia Plan Comments: (  Discussed general  anesthesia, including possible nausea, instrumentation of airway, sore throat,pulmonary aspiration, etc. I asked if the were any outstanding questions, or  concerns before we proceeded. )       Anesthesia Quick Evaluation

## 2015-03-04 LAB — BASIC METABOLIC PANEL
ANION GAP: 9 (ref 5–15)
BUN: 30 mg/dL — ABNORMAL HIGH (ref 6–20)
CHLORIDE: 104 mmol/L (ref 101–111)
CO2: 21 mmol/L — AB (ref 22–32)
Calcium: 7.7 mg/dL — ABNORMAL LOW (ref 8.9–10.3)
Creatinine, Ser: 1.76 mg/dL — ABNORMAL HIGH (ref 0.61–1.24)
GFR calc Af Amer: 43 mL/min — ABNORMAL LOW (ref >60)
GFR, EST NON AFRICAN AMERICAN: 37 mL/min — AB (ref >60)
GLUCOSE: 277 mg/dL — AB (ref 65–99)
POTASSIUM: 4.9 mmol/L (ref 3.5–5.1)
Sodium: 134 mmol/L — ABNORMAL LOW (ref 135–145)

## 2015-03-04 LAB — PREPARE RBC (CROSSMATCH)

## 2015-03-04 LAB — HEMOGLOBIN AND HEMATOCRIT, BLOOD
HCT: 26.6 % — ABNORMAL LOW (ref 39.0–52.0)
HEMATOCRIT: 25.2 % — AB (ref 39.0–52.0)
HEMATOCRIT: 29.7 % — AB (ref 39.0–52.0)
HEMOGLOBIN: 8.4 g/dL — AB (ref 13.0–17.0)
HEMOGLOBIN: 9.9 g/dL — AB (ref 13.0–17.0)
Hemoglobin: 8.9 g/dL — ABNORMAL LOW (ref 13.0–17.0)

## 2015-03-04 MED ORDER — PANTOPRAZOLE SODIUM 40 MG PO TBEC
40.0000 mg | DELAYED_RELEASE_TABLET | Freq: Once | ORAL | Status: AC
  Administered 2015-03-04: 40 mg via ORAL
  Filled 2015-03-04: qty 1

## 2015-03-04 MED ORDER — SODIUM CHLORIDE 0.9 % IV SOLN: Freq: Once | INTRAVENOUS | Status: AC

## 2015-03-04 MED ORDER — BISACODYL 10 MG RE SUPP
10.0000 mg | Freq: Once | RECTAL | Status: AC
  Administered 2015-03-04: 10 mg via RECTAL
  Filled 2015-03-04: qty 1

## 2015-03-04 MED ORDER — CALCIUM CARBONATE ANTACID 500 MG PO CHEW
1.0000 | CHEWABLE_TABLET | Freq: Three times a day (TID) | ORAL | Status: DC | PRN
  Administered 2015-03-04: 200 mg via ORAL
  Filled 2015-03-04: qty 1

## 2015-03-04 MED ORDER — SODIUM CHLORIDE 0.9 % IV BOLUS (SEPSIS)
500.0000 mL | Freq: Once | INTRAVENOUS | Status: AC
  Administered 2015-03-04: 500 mL via INTRAVENOUS

## 2015-03-04 MED ORDER — BELLADONNA ALKALOIDS-OPIUM 16.2-60 MG RE SUPP
1.0000 | Freq: Once | RECTAL | Status: AC
  Administered 2015-03-04: 1 via RECTAL
  Filled 2015-03-04: qty 1

## 2015-03-04 MED ORDER — BELLADONNA ALKALOIDS-OPIUM 16.2-60 MG RE SUPP: 1.0000 | Freq: Four times a day (QID) | RECTAL | Status: DC | PRN

## 2015-03-04 MED ORDER — CALCIUM CARBONATE ANTACID 500 MG PO CHEW
400.0000 mg | CHEWABLE_TABLET | Freq: Once | ORAL | Status: AC
  Administered 2015-03-04: 400 mg via ORAL
  Filled 2015-03-04: qty 2

## 2015-03-04 NOTE — Progress Notes (Signed)
Manual BP 78/36 lying down. Confirmed on both arms.  Pt denies dizziness/SOB. Reports fatigue and weakness.  Dr Alinda Money on unit and aware.  He is in to assess pt.

## 2015-03-04 NOTE — Progress Notes (Signed)
Got pt up to walk and he immediately fell weak and dizzy. Returned pt to bed, checked VS. BP 80/42. Dr. Alinda Money made aware. 500cc NS bolus and H&H ordered. Repeat BP 82/50. Dr. Alinda Money made aware. 2nd 500cc NS bolus ordered. Repeat BP 96/53. Pt continues to feel weak and light headed. Kannan Proia, Bing Neighbors, RN

## 2015-03-04 NOTE — Progress Notes (Signed)
Patient ID: Jesse Mann, male   DOB: 01-09-1940, 75 y.o.   MRN: [id number]  1 Day Post-Op Subjective: Pt with hypotension overnight with orthostasis noted when standing.  He received two 500 cc saline boluses of IVF and his Toradol was stopped.  He has remained on ASA 81 mg with history of cardiac stent.  This morning he feels improved except for some epigastric pain that is mild to moderate and worse with inspiration.  He denies CP, palpitations, or SOB.  Objective: Vital signs in last 24 hours: Temp:  [97.6 F (36.4 C)-98.3 F (36.8 C)] 97.7 F (36.5 C) (02/25 0336) Pulse Rate:  [68-86] 81 (02/25 0336) Resp:  [12-18] 16 (02/25 0336) BP: (80-129)/(40-95) 96/53 mmHg (02/25 0511) SpO2:  [94 %-100 %] 100 % (02/25 0336) Arterial Line BP: (109-114)/(51-54) 113/54 mmHg (02/24 1800) Weight:  [66.395 kg (146 lb 6 oz)] 66.395 kg (146 lb 6 oz) (02/24 1105)  Intake/Output from previous day: 02/24 0701 - 02/25 0700 In: 3000 [I.V.:3000] Out: 1155 [Urine:575; Drains:230; Blood:350] Intake/Output this shift:    Physical Exam:  General: Alert and oriented CV: RRR Lungs: Clear Abdomen: Soft, ND, Mild distention and tenderness of epigastric region and lower abdomen Incisions: C/D/I Ext: NT, No erythema  Lab Results:  Recent Labs  03/03/15 1745 03/04/15 0226 03/04/15 0627  HGB 11.7* 8.9* 8.4*  HCT 35.5* 26.6* 25.2*   BMET  Recent Labs  03/04/15 0236  NA 134*  K 4.9  CL 104  CO2 21*  GLUCOSE 277*  BUN 30*  CREATININE 1.76*  CALCIUM 7.7*     Studies/Results: No results found.  Assessment/Plan: POD # 1 s/p RALRP - He appears to have had mild postoperative bleeding resulting in hypotension last evening now improved.  His Hgb this morning is stable.  Will continue to monitor today with serial Hgb.  Will continue ASA 81 mg in light of him stabilizing and considering CV risk of stopping it with history of cardiac stent. - Begin ambulating again, IS - Continue catheter  and pelvic drain for now - Advance diet - Oral pain meds - Continue IVF until sure that he remains HD stable    LOS: 1 day   Paola Aleshire,LES 03/04/2015, 8:42 AM

## 2015-03-04 NOTE — Progress Notes (Signed)
Patient ID: Jesse Mann, male   DOB: 1940/02/20, 75 y.o.   MRN: [id number]  Called this morning after having seen Mr. Okray.  His BP this morning (confirmed with manual BP) is 78/46 lying down.  Will transfuse blood and hold ambulation.  Recheck post-transfusion Hgb.

## 2015-03-04 NOTE — Progress Notes (Signed)
Patient ID: TAG WOITAS, male   DOB: 11-27-1940, 75 y.o.   MRN: [id number]  Pt much improved after one unit of blood.  Now receiving 2nd unit.  BP stable now.  Will begin ambulation after 2nd unit.  Will check H/H after transfusion and in AM with hopes of d/c in the morning tomorrow.

## 2015-03-04 NOTE — Progress Notes (Signed)
Utilization review completed.  

## 2015-03-05 ENCOUNTER — Encounter (HOSPITAL_COMMUNITY): Payer: Self-pay

## 2015-03-05 ENCOUNTER — Observation Stay (HOSPITAL_COMMUNITY)
Admission: EM | Admit: 2015-03-05 | Discharge: 2015-03-07 | Disposition: A | Discharge status: Home / Self Care | Payer: Commercial Managed Care - HMO | Attending: Internal Medicine | Admitting: Internal Medicine

## 2015-03-05 ENCOUNTER — Emergency Department (HOSPITAL_COMMUNITY): Payer: Commercial Managed Care - HMO

## 2015-03-05 DIAGNOSIS — N390 Urinary tract infection, site not specified: Secondary | ICD-10-CM | POA: Insufficient documentation

## 2015-03-05 DIAGNOSIS — R071 Chest pain on breathing: Secondary | ICD-10-CM

## 2015-03-05 DIAGNOSIS — R7989 Other specified abnormal findings of blood chemistry: Secondary | ICD-10-CM | POA: Diagnosis present

## 2015-03-05 DIAGNOSIS — D649 Anemia, unspecified: Secondary | ICD-10-CM | POA: Diagnosis not present

## 2015-03-05 DIAGNOSIS — R079 Chest pain, unspecified: Secondary | ICD-10-CM | POA: Diagnosis present

## 2015-03-05 DIAGNOSIS — R008 Other abnormalities of heart beat: Secondary | ICD-10-CM | POA: Insufficient documentation

## 2015-03-05 DIAGNOSIS — C779 Secondary and unspecified malignant neoplasm of lymph node, unspecified: Secondary | ICD-10-CM | POA: Diagnosis not present

## 2015-03-05 DIAGNOSIS — D696 Thrombocytopenia, unspecified: Secondary | ICD-10-CM | POA: Diagnosis not present

## 2015-03-05 DIAGNOSIS — S301XXA Contusion of abdominal wall, initial encounter: Secondary | ICD-10-CM | POA: Diagnosis not present

## 2015-03-05 DIAGNOSIS — D638 Anemia in other chronic diseases classified elsewhere: Secondary | ICD-10-CM | POA: Insufficient documentation

## 2015-03-05 DIAGNOSIS — Z87891 Personal history of nicotine dependence: Secondary | ICD-10-CM | POA: Insufficient documentation

## 2015-03-05 DIAGNOSIS — D72829 Elevated white blood cell count, unspecified: Secondary | ICD-10-CM | POA: Insufficient documentation

## 2015-03-05 DIAGNOSIS — I498 Other specified cardiac arrhythmias: Secondary | ICD-10-CM | POA: Diagnosis present

## 2015-03-05 DIAGNOSIS — R748 Abnormal levels of other serum enzymes: Secondary | ICD-10-CM | POA: Diagnosis not present

## 2015-03-05 DIAGNOSIS — R0781 Pleurodynia: Principal | ICD-10-CM | POA: Insufficient documentation

## 2015-03-05 DIAGNOSIS — D62 Acute posthemorrhagic anemia: Secondary | ICD-10-CM | POA: Insufficient documentation

## 2015-03-05 DIAGNOSIS — X58XXXA Exposure to other specified factors, initial encounter: Secondary | ICD-10-CM | POA: Insufficient documentation

## 2015-03-05 DIAGNOSIS — I2510 Atherosclerotic heart disease of native coronary artery without angina pectoris: Secondary | ICD-10-CM | POA: Insufficient documentation

## 2015-03-05 DIAGNOSIS — R0602 Shortness of breath: Secondary | ICD-10-CM | POA: Insufficient documentation

## 2015-03-05 DIAGNOSIS — Y836 Removal of other organ (partial) (total) as the cause of abnormal reaction of the patient, or of later complication, without mention of misadventure at the time of the procedure: Secondary | ICD-10-CM | POA: Insufficient documentation

## 2015-03-05 DIAGNOSIS — Z7982 Long term (current) use of aspirin: Secondary | ICD-10-CM | POA: Insufficient documentation

## 2015-03-05 DIAGNOSIS — M159 Polyosteoarthritis, unspecified: Secondary | ICD-10-CM | POA: Insufficient documentation

## 2015-03-05 DIAGNOSIS — Z8546 Personal history of malignant neoplasm of prostate: Secondary | ICD-10-CM | POA: Insufficient documentation

## 2015-03-05 DIAGNOSIS — I252 Old myocardial infarction: Secondary | ICD-10-CM | POA: Insufficient documentation

## 2015-03-05 DIAGNOSIS — R778 Other specified abnormalities of plasma proteins: Secondary | ICD-10-CM | POA: Diagnosis present

## 2015-03-05 DIAGNOSIS — C61 Malignant neoplasm of prostate: Secondary | ICD-10-CM | POA: Diagnosis present

## 2015-03-05 DIAGNOSIS — G4730 Sleep apnea, unspecified: Secondary | ICD-10-CM | POA: Insufficient documentation

## 2015-03-05 DIAGNOSIS — N99820 Postprocedural hemorrhage and hematoma of a genitourinary system organ or structure following a genitourinary system procedure: Secondary | ICD-10-CM | POA: Diagnosis not present

## 2015-03-05 DIAGNOSIS — N2889 Other specified disorders of kidney and ureter: Secondary | ICD-10-CM | POA: Diagnosis not present

## 2015-03-05 DIAGNOSIS — R509 Fever, unspecified: Secondary | ICD-10-CM | POA: Diagnosis not present

## 2015-03-05 DIAGNOSIS — R000 Tachycardia, unspecified: Secondary | ICD-10-CM | POA: Insufficient documentation

## 2015-03-05 DIAGNOSIS — K219 Gastro-esophageal reflux disease without esophagitis: Secondary | ICD-10-CM | POA: Insufficient documentation

## 2015-03-05 DIAGNOSIS — Z79899 Other long term (current) drug therapy: Secondary | ICD-10-CM | POA: Insufficient documentation

## 2015-03-05 DIAGNOSIS — R0789 Other chest pain: Secondary | ICD-10-CM | POA: Diagnosis not present

## 2015-03-05 DIAGNOSIS — I499 Cardiac arrhythmia, unspecified: Secondary | ICD-10-CM

## 2015-03-05 DIAGNOSIS — N183 Chronic kidney disease, stage 3 (moderate): Secondary | ICD-10-CM | POA: Insufficient documentation

## 2015-03-05 DIAGNOSIS — Z9079 Acquired absence of other genital organ(s): Secondary | ICD-10-CM

## 2015-03-05 DIAGNOSIS — N179 Acute kidney failure, unspecified: Secondary | ICD-10-CM | POA: Insufficient documentation

## 2015-03-05 LAB — CBC WITH DIFFERENTIAL/PLATELET
BASOS PCT: 0 %
Basophils Absolute: 0 10*3/uL (ref 0.0–0.1)
Eosinophils Absolute: 0.1 10*3/uL (ref 0.0–0.7)
Eosinophils Relative: 1 %
HEMATOCRIT: 27.3 % — AB (ref 39.0–52.0)
Hemoglobin: 9.4 g/dL — ABNORMAL LOW (ref 13.0–17.0)
LYMPHS ABS: 1 10*3/uL (ref 0.7–4.0)
Lymphocytes Relative: 8 %
MCH: 30.8 pg (ref 26.0–34.0)
MCHC: 34.4 g/dL (ref 30.0–36.0)
MCV: 89.5 fL (ref 78.0–100.0)
MONO ABS: 2 10*3/uL — AB (ref 0.1–1.0)
MONOS PCT: 14 %
NEUTROS ABS: 10.5 10*3/uL — AB (ref 1.7–7.7)
Neutrophils Relative %: 77 %
Platelets: 150 10*3/uL (ref 150–400)
RBC: 3.05 MIL/uL — ABNORMAL LOW (ref 4.22–5.81)
RDW: 13.6 % (ref 11.5–15.5)
WBC: 13.6 10*3/uL — ABNORMAL HIGH (ref 4.0–10.5)

## 2015-03-05 LAB — BASIC METABOLIC PANEL
ANION GAP: 7 (ref 5–15)
BUN: 42 mg/dL — ABNORMAL HIGH (ref 6–20)
CALCIUM: 8.4 mg/dL — AB (ref 8.9–10.3)
CHLORIDE: 104 mmol/L (ref 101–111)
CO2: 23 mmol/L (ref 22–32)
Creatinine, Ser: 1.94 mg/dL — ABNORMAL HIGH (ref 0.61–1.24)
GFR calc Af Amer: 38 mL/min — ABNORMAL LOW (ref >60)
GFR calc non Af Amer: 33 mL/min — ABNORMAL LOW (ref >60)
GLUCOSE: 137 mg/dL — AB (ref 65–99)
Potassium: 4.4 mmol/L (ref 3.5–5.1)
Sodium: 134 mmol/L — ABNORMAL LOW (ref 135–145)

## 2015-03-05 LAB — PROTIME-INR
INR: 1.42 (ref 0.00–1.49)
Prothrombin Time: 16.9 seconds — ABNORMAL HIGH (ref 11.6–15.2)

## 2015-03-05 LAB — HEMOGLOBIN AND HEMATOCRIT, BLOOD
HCT: 28.1 % — ABNORMAL LOW (ref 39.0–52.0)
HEMOGLOBIN: 9.5 g/dL — AB (ref 13.0–17.0)

## 2015-03-05 LAB — COMPREHENSIVE METABOLIC PANEL
ALBUMIN: 3.4 g/dL — AB (ref 3.5–5.0)
ALK PHOS: 32 U/L — AB (ref 38–126)
ALT: 11 U/L — ABNORMAL LOW (ref 17–63)
ANION GAP: 6 (ref 5–15)
AST: 26 U/L (ref 15–41)
BILIRUBIN TOTAL: 0.9 mg/dL (ref 0.3–1.2)
BUN: 35 mg/dL — ABNORMAL HIGH (ref 6–20)
CALCIUM: 8.4 mg/dL — AB (ref 8.9–10.3)
CO2: 25 mmol/L (ref 22–32)
Chloride: 104 mmol/L (ref 101–111)
Creatinine, Ser: 1.52 mg/dL — ABNORMAL HIGH (ref 0.61–1.24)
GFR, EST AFRICAN AMERICAN: 51 mL/min — AB (ref >60)
GFR, EST NON AFRICAN AMERICAN: 44 mL/min — AB (ref >60)
Glucose, Bld: 115 mg/dL — ABNORMAL HIGH (ref 65–99)
POTASSIUM: 4.7 mmol/L (ref 3.5–5.1)
Sodium: 135 mmol/L (ref 135–145)
TOTAL PROTEIN: 5.5 g/dL — AB (ref 6.5–8.1)

## 2015-03-05 LAB — D-DIMER, QUANTITATIVE (NOT AT ARMC): D DIMER QUANT: 3.45 ug{FEU}/mL — AB (ref 0.00–0.50)

## 2015-03-05 LAB — TROPONIN I
TROPONIN I: 0.06 ng/mL — AB (ref <0.031)
Troponin I: 0.05 ng/mL — ABNORMAL HIGH (ref <0.031)

## 2015-03-05 LAB — MAGNESIUM: MAGNESIUM: 1.8 mg/dL (ref 1.7–2.4)

## 2015-03-05 MED ORDER — ONDANSETRON HCL 4 MG/2ML IJ SOLN: 4.0000 mg | Freq: Four times a day (QID) | INTRAMUSCULAR | Status: DC | PRN

## 2015-03-05 MED ORDER — FAMOTIDINE IN NACL 20-0.9 MG/50ML-% IV SOLN: 20.0000 mg | Freq: Once | INTRAVENOUS | Status: DC

## 2015-03-05 MED ORDER — ACETAMINOPHEN 325 MG PO TABS: 650.0000 mg | ORAL_TABLET | ORAL | Status: DC | PRN

## 2015-03-05 MED ORDER — PANTOPRAZOLE SODIUM 40 MG IV SOLR
40.0000 mg | INTRAVENOUS | Status: DC
  Administered 2015-03-05 – 2015-03-06 (×2): 40 mg via INTRAVENOUS
  Filled 2015-03-05 (×2): qty 40

## 2015-03-05 MED ORDER — MAGNESIUM SULFATE 2 GM/50ML IV SOLN
2.0000 g | Freq: Once | INTRAVENOUS | Status: AC
  Administered 2015-03-05: 2 g via INTRAVENOUS
  Filled 2015-03-05: qty 50

## 2015-03-05 MED ORDER — SIMVASTATIN 10 MG PO TABS
10.0000 mg | ORAL_TABLET | Freq: Every day | ORAL | Status: DC
  Administered 2015-03-05 – 2015-03-06 (×2): 10 mg via ORAL
  Filled 2015-03-05 (×2): qty 1

## 2015-03-05 MED ORDER — SODIUM CHLORIDE 0.9% FLUSH
3.0000 mL | Freq: Two times a day (BID) | INTRAVENOUS | Status: DC
  Administered 2015-03-05 – 2015-03-06 (×2): 3 mL via INTRAVENOUS

## 2015-03-05 MED ORDER — ONDANSETRON HCL 4 MG PO TABS: 4.0000 mg | ORAL_TABLET | Freq: Four times a day (QID) | ORAL | Status: DC | PRN

## 2015-03-05 MED ORDER — RISAQUAD PO CAPS
2.0000 | ORAL_CAPSULE | Freq: Every day | ORAL | Status: DC
  Administered 2015-03-06 – 2015-03-07 (×2): 2 via ORAL
  Filled 2015-03-05 (×2): qty 2

## 2015-03-05 MED ORDER — SODIUM CHLORIDE 0.9 % IV BOLUS (SEPSIS)
1000.0000 mL | Freq: Once | INTRAVENOUS | Status: AC
  Administered 2015-03-05: 1000 mL via INTRAVENOUS

## 2015-03-05 MED ORDER — CARVEDILOL 3.125 MG PO TABS
3.1250 mg | ORAL_TABLET | Freq: Two times a day (BID) | ORAL | Status: DC
  Administered 2015-03-06 – 2015-03-07 (×3): 3.125 mg via ORAL
  Filled 2015-03-05 (×3): qty 1

## 2015-03-05 MED ORDER — CARVEDILOL 3.125 MG PO TABS
3.1250 mg | ORAL_TABLET | Freq: Once | ORAL | Status: AC
  Administered 2015-03-05: 3.125 mg via ORAL
  Filled 2015-03-05 (×2): qty 1

## 2015-03-05 MED ORDER — PROBIOTIC DAILY PO CAPS: 2.0000 | ORAL_CAPSULE | Freq: Every day | ORAL | Status: DC

## 2015-03-05 MED ORDER — NITROGLYCERIN 0.4 MG SL SUBL: 0.4000 mg | SUBLINGUAL_TABLET | SUBLINGUAL | Status: DC | PRN

## 2015-03-05 MED ORDER — FAMOTIDINE IN NACL 20-0.9 MG/50ML-% IV SOLN
20.0000 mg | Freq: Once | INTRAVENOUS | Status: AC
  Administered 2015-03-05: 20 mg via INTRAVENOUS
  Filled 2015-03-05: qty 50

## 2015-03-05 MED ORDER — HYDROCODONE-ACETAMINOPHEN 5-325 MG PO TABS
1.0000 | ORAL_TABLET | ORAL | Status: DC | PRN
  Administered 2015-03-06 (×2): 1 via ORAL
  Filled 2015-03-05 (×2): qty 1

## 2015-03-05 MED ORDER — PANTOPRAZOLE SODIUM 40 MG IV SOLR: 40.0000 mg | INTRAVENOUS | Status: DC

## 2015-03-05 MED ORDER — IOHEXOL 350 MG/ML SOLN
100.0000 mL | Freq: Once | INTRAVENOUS | Status: AC | PRN
  Administered 2015-03-05: 80 mL via INTRAVENOUS

## 2015-03-05 MED ORDER — SODIUM CHLORIDE 0.9 % IV SOLN
INTRAVENOUS | Status: DC
  Administered 2015-03-05 – 2015-03-07 (×2): via INTRAVENOUS

## 2015-03-05 MED ORDER — ASPIRIN 325 MG PO TABS
325.0000 mg | ORAL_TABLET | Freq: Once | ORAL | Status: AC
  Administered 2015-03-05: 325 mg via ORAL
  Filled 2015-03-05: qty 1

## 2015-03-05 MED ORDER — MORPHINE SULFATE (PF) 4 MG/ML IV SOLN
6.0000 mg | Freq: Once | INTRAVENOUS | Status: AC
  Administered 2015-03-05: 6 mg via INTRAVENOUS
  Filled 2015-03-05: qty 2

## 2015-03-05 MED ORDER — DEXTROSE 5 % IV SOLN
1.0000 g | INTRAVENOUS | Status: DC
  Administered 2015-03-05 – 2015-03-06 (×2): 1 g via INTRAVENOUS
  Filled 2015-03-05 (×2): qty 10

## 2015-03-05 NOTE — Discharge Summary (Signed)
  Date of admission: 03/03/2015  Date of discharge: 03/05/2015  Admission diagnosis: Prostate Cancer  Discharge diagnosis: Prostate Cancer  History and Physical: For full details, please see admission history and physical. Briefly, Jesse Mann is a 75 y.o. gentleman with localized prostate cancer.  After discussing management/treatment options, he elected to proceed with surgical treatment.  Hospital Course: HUMPHREY GUERREIRO was taken to the operating room on 03/03/2015 and underwent a robotic assisted laparoscopic radical prostatectomy. He tolerated this procedure well and without complications. Postoperatively, he was able to be transferred to a regular hospital room following recovery from anesthesia.  He developed hypotension and dizziness the evening of POD # 1.  His Hgb was noted to be decreasing consistent with postoperative bleeding.  He remained hypotensive on POD #1 and considering his hemodynamic instability and his cardiac history, he received 2 units of PRBC transfusion.  He subsequently stabilized and was able to continue his ASA 81 mg considering his history of a cardiac stent. His Hgb was stable on POD # 2 and he ambulated without difficulty and his urine remained clear.  He had excellent urine output with appropriately minimal output from his pelvic drain and his pelvic drain was removed on POD #2.  He was transitioned to oral pain medication, tolerated his diet, and had met all discharge criteria and was able to be discharged home later on POD#2.   Laboratory values:  Recent Labs  03/04/15 0627 03/04/15 1803 03/05/15 0508  HGB 8.4* 9.9* 9.5*  HCT 25.2* 29.7* 28.1*    Disposition: Home  Discharge instruction: He was instructed to be ambulatory but to refrain from heavy lifting, strenuous activity, or driving. He was instructed on urethral catheter care.  Discharge medications:     Medication List    TAKE these medications        aspirin EC 81 MG tablet  Take 81  mg by mouth.     carvedilol 3.125 MG tablet  Commonly known as:  COREG  Take 3.125 mg by mouth 2 (two) times daily with a meal.     HYDROcodone-acetaminophen 5-325 MG tablet  Commonly known as:  NORCO  Take 1-2 tablets by mouth every 6 (six) hours as needed.     nitroGLYCERIN 0.4 MG SL tablet  Commonly known as:  NITROSTAT  Place 0.4 mg under the tongue every 5 (five) minutes as needed for chest pain.     PROBIOTIC DAILY PO  Take 1 tablet by mouth daily.     simvastatin 10 MG tablet  Commonly known as:  ZOCOR  Take 10 mg by mouth at bedtime.     sulfamethoxazole-trimethoprim 800-160 MG tablet  Commonly known as:  BACTRIM DS,SEPTRA DS  Take 1 tablet by mouth 2 (two) times daily. Start the day prior to foley removal appointment        Followup: He will followup in 1 week for catheter removal and to discuss his surgical pathology results.

## 2015-03-05 NOTE — H&P (Signed)
[facility] History and Physical  Jesse Mann [id number] DOB: 08-28-1940 DOA: 03/05/2015  Referring physician: Dorise Bullion, M.D. PCP: [name], MD   Chief Complaint: Right-sided chest and shoulder pain  HPI: Jesse Mann is a 75 y.o. male with  a past medical history of prostate cancer, just discharged today from the hospital after prostatectomy, GERD, sleep apnea, CAD status post MI, cardiac dysrhythmia who returned to the hospital shortly after being discharged due to right-sided chest and shoulder pain.  Per patient, he was doing relatively well after surgery. He had a received a blood transfusion, but stated that he was feeling better today, was going home and shortly after discharge, on his way home, started having pleuritic right-sided chest pain and shoulder pain. So he took some aspirin at home, but subsequently  returned to the emergency department. He states that the pain is constant, worsened by deep inspiration and relieved by morphine. At times, he has had mild dyspnea, but denies nausea, emesis, diaphoresis, palpitations, lower extremity edema, PND or orthopnea.   In the emergency department, patient was found to be febrile, workup showed elevated d-dimer, mildly elevated troponin, anemia, mild leukocytosis and a CT scan of the abdomen showing a right pelvic hematoma and edema along the right psoas muscle. His cardiac telemetry has also shown bigeminy, but the patient states that he has had this in the past. He has not taken his evening carvedilol.   Urology was consulted by Dr. Dolly Rias and our service was requested to admit the patient for medical management.    Review of Systems:  Constitutional:  Fevers, chills, fatigue.  No weight loss, night sweats.   HEENT:  No headaches, Difficulty swallowing,Tooth/dental problems,Sore throat,  No sneezing, itching, ear ache, nasal congestion, post nasal drip,  Cardio-vascular:  as earlier mentioned GI:    No heartburn, indigestion, abdominal pain, nausea, vomiting, diarrhea, change in bowel habits, loss of appetite  Resp: Mild dyspnea, no cough, no wheezing, no hemoptysis.:  no rash or lesions.  GU:  no dysuria, change in color of urine, no urgency or frequency. No flank pain.  Musculoskeletal. History of arthritis, but no particular arthralgia at this time and no disc decrease in range of motion. . No back pain.  Psych:  No change in mood or affect. No depression or anxiety. No memory loss.   Past Medical History  Diagnosis Date  . Prostate cancer ([place])   . Cardiac murmur   . GERD (gastroesophageal reflux disease)   . Sleep apnea   . Hemorrhoids   . Myocardial infarction ([place])     7'14[facility]- "feeling strange" eavaluated, heart cath done with stents x2 placed  . Recurrent dry cough     intermittent daily -occ. production  . 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 Cardiac arrhythmia     Dr. Revankar,cardiology [phone] [facility])   Past Surgical History  Procedure Laterality Date  . Prostate biopsy    . Tonsillectomy    . Repair extensor tendon hand Right     (3-4 fingers) '65- x3-4 times"trauma injury  . Colonoscopy  2011  . Cardiac catheterization      '14- stents placed x2 only.  . Inguinal hernia repair    . Hernia repair  1997 and 1995    inguinal (x2 on one side)   Social History:  reports that he quit smoking about 49 years ago. His smoking  use included Cigarettes. He has a 10 pack-year smoking history. He has never used smokeless tobacco. He reports that he does not drink alcohol or use illicit drugs.  No Known Allergies  Family History  Problem Relation Age of Onset  . Heart failure Mother   . Cancer Father     colon  . Cancer Paternal Grandmother     type unknown    Prior to Admission medications   Medication Sig Start Date End Date Taking? Authorizing  Provider  aspirin 325 MG EC tablet Take 325 mg by mouth every 6 (six) hours as needed for pain.   Yes Historical Provider, MD  aspirin EC 81 MG tablet Take 81 mg by mouth.   Yes Historical Provider, MD  carvedilol (COREG) 3.125 MG tablet Take 3.125 mg by mouth 2 (two) times daily with a meal.   Yes Historical Provider, MD  HYDROcodone-acetaminophen (NORCO) 5-325 MG tablet Take 1-2 tablets by mouth every 6 (six) hours as needed. 03/03/15  Yes Amanda Dancy, PA-C  nitroGLYCERIN (NITROSTAT) 0.4 MG SL tablet Place 0.4 mg under the tongue every 5 (five) minutes as needed for chest pain.  09/28/14  Yes Historical Provider, MD  Probiotic Product (PROBIOTIC DAILY PO) Take 1 tablet by mouth daily.    Yes Historical Provider, MD  simvastatin (ZOCOR) 10 MG tablet Take 10 mg by mouth at bedtime.  01/10/15  Yes Historical Provider, MD  sulfamethoxazole-trimethoprim (BACTRIM DS,SEPTRA DS) 800-160 MG tablet Take 1 tablet by mouth 2 (two) times daily. Start the day prior to foley removal appointment Patient not taking: Reported on 03/05/2015 03/03/15   Debbrah Alar, PA-C   Physical Exam: Filed Vitals:   03/05/15 1600 03/05/15 1700 03/05/15 1743 03/05/15 1800  BP: 131/79 140/60 125/72 130/69  Pulse:   97   Temp:   101.5 F (38.6 C)   TempSrc:   Rectal   Resp: 18 23 22 22   SpO2: 96%  96% 93%    Wt Readings from Last 3 Encounters:  03/03/15 66.395 kg (146 lb 6 oz)  02/28/15 66.395 kg (146 lb 6 oz)  01/20/15 66.906 kg (147 lb 8 oz)    General:  Appears calm and comfortable Eyes: PERRL, normal lids, irises & conjunctiva ENT: grossly normal hearing, lips & tongue Neck: no LAD, masses or thyromegaly Cardiovascular: Irregular, systolic murmur,. No LE edema. Telemetry: Bigeminy  Respiratory: CTA bilaterally, no w/r/r. Normal respiratory effort. Abdomen:  no distention, multiple surgical scars with Dermabond, soft, Mild right upper and lower quadrant tenderness, no guarding, no rebound.  Skin: Multiple  ecchymosis areas in the abdomen,   Musculoskeletal: grossly normal tone BUE/BLE Psychiatric: grossly normal mood and affect, speech fluent and appropriate Neurologic:  awake, alert, oriented 3, grossly non-focal.          Labs on Admission:  Basic Metabolic Panel:  Recent Labs Lab 02/28/15 1200 03/04/15 0236 03/05/15 0508 03/05/15 1408  NA 140 134* 134* 135  K 4.4 4.9 4.4 4.7  CL 107 104 104 104  CO2 27 21* 23 25  GLUCOSE 105* 277* 137* 115*  BUN 18 30* 42* 35*  CREATININE 1.14 1.76* 1.94* 1.52*  CALCIUM 9.4 7.7* 8.4* 8.4*   Liver Function Tests:  Recent Labs Lab 02/28/15 1200 03/05/15 1408  AST 20 26  ALT 13* 11*  ALKPHOS 46 32*  BILITOT 0.6 0.9  PROT 6.8 5.5*  ALBUMIN 4.2 3.4*   CBC:  Recent Labs Lab 02/28/15 1200  03/04/15 0226 03/04/15 0627 03/04/15 1803 03/05/15  [id number] 03/05/15 1408  WBC 5.6  --   --   --   --   --  13.6*  NEUTROABS  --   --   --   --   --   --  10.5*  HGB 15.1  < > 8.9* 8.4* 9.9* 9.5* 9.4*  HCT 46.6  < > 26.6* 25.2* 29.7* 28.1* 27.3*  MCV 93.6  --   --   --   --   --  89.5  PLT 247  --   --   --   --   --  150  < > = values in this interval not displayed. Cardiac Enzymes:  Recent Labs Lab 03/05/15 1503  TROPONINI 0.06*     Radiological Exams on Admission: Ct Angio Chest Pe W/cm &/or Wo Cm  03/05/2015  CLINICAL DATA:  Recent prostatectomy with chest pain and shoulder pain, initial encounter EXAM: CT ANGIOGRAPHY CHEST CT ABDOMEN AND PELVIS WITH CONTRAST TECHNIQUE: Multidetector CT imaging of the chest was performed using the standard protocol during bolus administration of intravenous contrast. Multiplanar CT image reconstructions and MIPs were obtained to evaluate the vascular anatomy. Multidetector CT imaging of the abdomen and pelvis was performed using the standard protocol during bolus administration of intravenous contrast. CONTRAST:  20mL OMNIPAQUE IOHEXOL 350 MG/ML SOLN COMPARISON:  None. FINDINGS: CTA CHEST FINDINGS The  lungs are well aerated bilaterally. No focal infiltrate, effusion or pneumothorax is noted. The thoracic inlet is within normal limits. Thoracic aorta shows no evidence of dissection. The pulmonary artery shows a normal branching pattern. No filling defects to suggest pulmonary embolism are identified. Coronary calcifications are noted. Some air is noted within the left chest wall anteriorly consistent with the recent surgery. No acute bony abnormality is noted. CT ABDOMEN and PELVIS FINDINGS The liver, gallbladder, spleen, adrenal glands and pancreas are within normal limits. The kidneys are well visualized bilaterally. A hypodense lesion is noted in the left kidney likely representing a cyst but incompletely evaluated on this exam. It measures approximately 1 cm. Some scattered free air is noted as well as air within the abdominal wall consistent with the recent surgery. Fluid is noted posterior to the right kidney and extending along the right psoas muscle into the pelvis likely related to postoperative change. This does not appear to be acute in nature. The bladder is decompressed by Foley catheter. Changes are noted in the pelvic wall particularly on the right consistent with hemorrhage. This appears to be more acute with respect to the fluid in the right retroperitoneum which appears more chronic. No acute bony abnormality is noted. Scattered diverticular change is noted without diverticulitis. Review of the MIP images confirms the above findings. IMPRESSION: Postsurgical changes are noted consistent with prostatectomy. Some postoperative hemorrhage is noted in the right pelvic wall. The overall area measures approximately 11 by 5 cm in greatest AP and transverse dimensions. Some more chronic appearing fluid is noted extending superiorly along the right psoas muscle posterior to the kidney. This is also likely related to the recent surgery. Followup imaging is recommended to assess for resolution No evidence  of pulmonary embolism. Hypodensity within the left kidney. This may represent a small cyst. Nonemergent workup can be performed as clinically indicated. Electronically Signed   By: Inez Catalina M.D.   On: 03/05/2015 17:42   Ct Abdomen Pelvis W Contrast  03/05/2015  CLINICAL DATA:  Recent prostatectomy with chest pain and shoulder pain, initial encounter EXAM: CT ANGIOGRAPHY CHEST CT ABDOMEN  AND PELVIS WITH CONTRAST TECHNIQUE: Multidetector CT imaging of the chest was performed using the standard protocol during bolus administration of intravenous contrast. Multiplanar CT image reconstructions and MIPs were obtained to evaluate the vascular anatomy. Multidetector CT imaging of the abdomen and pelvis was performed using the standard protocol during bolus administration of intravenous contrast. CONTRAST:  40mL OMNIPAQUE IOHEXOL 350 MG/ML SOLN COMPARISON:  None. FINDINGS: CTA CHEST FINDINGS The lungs are well aerated bilaterally. No focal infiltrate, effusion or pneumothorax is noted. The thoracic inlet is within normal limits. Thoracic aorta shows no evidence of dissection. The pulmonary artery shows a normal branching pattern. No filling defects to suggest pulmonary embolism are identified. Coronary calcifications are noted. Some air is noted within the left chest wall anteriorly consistent with the recent surgery. No acute bony abnormality is noted. CT ABDOMEN and PELVIS FINDINGS The liver, gallbladder, spleen, adrenal glands and pancreas are within normal limits. The kidneys are well visualized bilaterally. A hypodense lesion is noted in the left kidney likely representing a cyst but incompletely evaluated on this exam. It measures approximately 1 cm. Some scattered free air is noted as well as air within the abdominal wall consistent with the recent surgery. Fluid is noted posterior to the right kidney and extending along the right psoas muscle into the pelvis likely related to postoperative change. This does  not appear to be acute in nature. The bladder is decompressed by Foley catheter. Changes are noted in the pelvic wall particularly on the right consistent with hemorrhage. This appears to be more acute with respect to the fluid in the right retroperitoneum which appears more chronic. No acute bony abnormality is noted. Scattered diverticular change is noted without diverticulitis. Review of the MIP images confirms the above findings. IMPRESSION: Postsurgical changes are noted consistent with prostatectomy. Some postoperative hemorrhage is noted in the right pelvic wall. The overall area measures approximately 11 by 5 cm in greatest AP and transverse dimensions. Some more chronic appearing fluid is noted extending superiorly along the right psoas muscle posterior to the kidney. This is also likely related to the recent surgery. Followup imaging is recommended to assess for resolution No evidence of pulmonary embolism. Hypodensity within the left kidney. This may represent a small cyst. Nonemergent workup can be performed as clinically indicated. Electronically Signed   By: Inez Catalina M.D.   On: 03/05/2015 17:42   Dg Abd Acute W/chest  03/05/2015  CLINICAL DATA:  Right chest pain and shortness of breath with fever. Radical prostatectomy and bilateral pelvic lymph node dissection 2 days ago. EXAM: DG ABDOMEN ACUTE W/ 1V CHEST COMPARISON:  07/29/2012 chest radiographs FINDINGS: The cardiomediastinal silhouette is unremarkable. There is no evidence of pleural effusion, pneumothorax or airspace disease. The bowel gas pattern is unremarkable. No dilated bowel loops are noted. A small amount of pneumoperitoneum underlying the right hemidiaphragm noted. Catheter overlying the lower pelvis is noted. No acute or suspicious bony abnormalities noted. IMPRESSION: Small amount of pneumoperitoneum which is most likely postoperative. Correlate clinically. Unremarkable bowel gas pattern.  No evidence of bowel obstruction. No  evidence of acute cardiopulmonary disease. Electronically Signed   By: Margarette Canada M.D.   On: 03/05/2015 14:08    EKG: Independently reviewed. Vent. rate 116 BPM PR interval 197 ms QRS duration 129 ms QT/QTc 330/458 ms P-R-T axes 81 -38 154 Sinus tachycardia Ventricular bigeminy Nonspecific IVCD with LAD Nonspecific T abnormalities, lateral leads  Assessment/Plan Principal Problem:   Chest pain  Pain is atypical,  radiates to his right shoulder, but the patient has multiple risk factors for CAD. This could be the result of irritation from fluid on right psoas area.  Patient also has pelvic hematoma as described above.  Admit to telemetry for observation. Serial troponin levels.  Per patient, his most recent echo, done by his cardiologist  just before prostatectomy,  showed an EF of 37%.    Active Problems:   Elevated troponin Likely a leak from tachycardia secondary to fever and anemia. Monitor serial levels.    Prostate cancer [facility])   S/P prostatectomy Urology was notified by the emergency department.    Bigeminy Magnesium sulfate 2 g IVPB Add on magnesium level to previous labs. Patient to take his Coreg this evening.    Anemia Hematoma on CT scan.  Urology was notified of this finding by Dr. Dolly Rias.  Monitor H&H closely.     Elevated d-dimer  likely as a result of surgery and hematoma.     Code Status: Full code. DVT Prophylaxis: SCDs.  Family Communication:  his wife and daughter were present in the room.  Disposition Plan:  admit for IV antibiotic therapy, telemetry, troponin levels trending.   Time spent: over 70 minutes were spent in the process of this admission.   Reubin Milan, M.D.  [facility] Pager [phone].

## 2015-03-05 NOTE — ED Provider Notes (Signed)
CSN: [id number]     Arrival date & time 03/05/15  1311 History   First MD Initiated Contact with Patient 03/05/15 1451     Chief Complaint  Patient presents with  . Shoulder Pain  . Shortness of Breath     (Consider location/radiation/quality/duration/timing/severity/associated sxs/prior Treatment) Patient is a 75 y.o. male presenting with abdominal pain.  Abdominal Pain Pain location:  RUQ Pain quality: aching and sharp   Pain radiates to:  R shoulder and chest Pain severity:  Mild Duration:  5 hours Chronicity:  New Associated symptoms: chest pain (right sided), cough and shortness of breath   Associated symptoms: no dysuria, no fatigue and no fever     Past Medical History  Diagnosis Date  . Prostate cancer ([place])   . Cardiac murmur   . GERD (gastroesophageal reflux disease)   . Sleep apnea   . Hemorrhoids   . Myocardial infarction ([place])     7'14- [facility]- "feeling strange" eavaluated, heart cath done with stents x2 placed  . Recurrent dry cough     intermittent daily -occ. production  . 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 Cardiac arrhythmia     Dr. Revankar,cardiology [phone] [facility])   Past Surgical History  Procedure Laterality Date  . Prostate biopsy    . Tonsillectomy    . Repair extensor tendon hand Right     (3-4 fingers) '65- x3-4 times"trauma injury  . Colonoscopy  2011  . Cardiac catheterization      '14- stents placed x2 only.  . Inguinal hernia repair    . Hernia repair  1997 and 1995    inguinal (x2 on one side)   Family History  Problem Relation Age of Onset  . Heart failure Mother   . Cancer Father     colon  . Cancer Paternal Grandmother     type unknown   Social History  Substance Use Topics  . Smoking status: Former Smoker -- 1.00 packs/day for 10 years    Types: Cigarettes    Quit date: 01/07/1966  . Smokeless tobacco: Never Used   . Alcohol Use: No    Review of Systems  Constitutional: Negative for fever and fatigue.  Respiratory: Positive for cough and shortness of breath.   Cardiovascular: Positive for chest pain (right sided).  Gastrointestinal: Positive for abdominal pain.  Genitourinary: Negative for dysuria and enuresis.  Skin: Negative for pallor.  Neurological: Negative for light-headedness and headaches.  All other systems reviewed and are negative.     Allergies  Review of patient's allergies indicates no known allergies.  Home Medications   Prior to Admission medications   Medication Sig Start Date End Date Taking? Authorizing Provider  aspirin 325 MG EC tablet Take 325 mg by mouth every 6 (six) hours as needed for pain.   Yes Historical Provider, MD  aspirin EC 81 MG tablet Take 81 mg by mouth.   Yes Historical Provider, MD  carvedilol (COREG) 3.125 MG tablet Take 3.125 mg by mouth 2 (two) times daily with a meal.   Yes Historical Provider, MD  HYDROcodone-acetaminophen (NORCO) 5-325 MG tablet Take 1-2 tablets by mouth every 6 (six) hours as needed. 03/03/15  Yes Amanda Dancy, PA-C  nitroGLYCERIN (NITROSTAT) 0.4 MG SL tablet Place 0.4 mg under the tongue every 5 (five) minutes as needed for chest pain.  09/28/14  Yes Historical Provider, MD  Probiotic Product (PROBIOTIC DAILY PO)  Take 1 tablet by mouth daily.    Yes Historical Provider, MD  simvastatin (ZOCOR) 10 MG tablet Take 10 mg by mouth at bedtime.  01/10/15  Yes Historical Provider, MD  sulfamethoxazole-trimethoprim (BACTRIM DS,SEPTRA DS) 800-160 MG tablet Take 1 tablet by mouth 2 (two) times daily. Start the day prior to foley removal appointment Patient not taking: Reported on 03/05/2015 03/03/15   Debbrah Alar, PA-C   BP 130/68 mmHg  Pulse 87  Temp(Src) 99.5 F (37.5 C) (Oral)  Resp 20  Ht 5\' 6"  (1.676 m)  Wt 156 lb 8.4 oz (71 kg)  BMI 25.28 kg/m2  SpO2 97% Physical Exam  Constitutional: He is oriented to person, place, and time. He  appears well-developed and well-nourished.  HENT:  Head: Normocephalic and atraumatic.  Neck: Normal range of motion.  Cardiovascular: Normal rate and regular rhythm.   Pulmonary/Chest: Effort normal. No respiratory distress. He has no wheezes. He has no rales.  Abdominal: Soft. He exhibits no distension. There is no tenderness. There is no rebound.  Musculoskeletal: Normal range of motion. He exhibits no edema or tenderness.  Neurological: He is alert and oriented to person, place, and time. No cranial nerve deficit.  Skin: Skin is warm and dry.  Multiple wounds with dermabond, no e/o infection  Nursing note and vitals reviewed.   ED Course  Procedures (including critical care time) Labs Review Labs Reviewed  CBC WITH DIFFERENTIAL/PLATELET - Abnormal; Notable for the following:    WBC 13.6 (*)    RBC 3.05 (*)    Hemoglobin 9.4 (*)    HCT 27.3 (*)    Neutro Abs 10.5 (*)    Monocytes Absolute 2.0 (*)    All other components within normal limits  COMPREHENSIVE METABOLIC PANEL - Abnormal; Notable for the following:    Glucose, Bld 115 (*)    BUN 35 (*)    Creatinine, Ser 1.52 (*)    Calcium 8.4 (*)    Total Protein 5.5 (*)    Albumin 3.4 (*)    ALT 11 (*)    Alkaline Phosphatase 32 (*)    GFR calc non Af Amer 44 (*)    GFR calc Af Amer 51 (*)    All other components within normal limits  D-DIMER, QUANTITATIVE (NOT AT [facility]) - Abnormal; Notable for the following:    D-Dimer, Quant 3.45 (*)    All other components within normal limits  TROPONIN I - Abnormal; Notable for the following:    Troponin I 0.06 (*)    All other components within normal limits  MAGNESIUM  URINALYSIS, ROUTINE W REFLEX MICROSCOPIC (NOT AT [facility])    Imaging Review Ct Angio Chest Pe W/cm &/or Wo Cm  03/05/2015  CLINICAL DATA:  Recent prostatectomy with chest pain and shoulder pain, initial encounter EXAM: CT ANGIOGRAPHY CHEST CT ABDOMEN AND PELVIS WITH CONTRAST TECHNIQUE: Multidetector CT imaging of the  chest was performed using the standard protocol during bolus administration of intravenous contrast. Multiplanar CT image reconstructions and MIPs were obtained to evaluate the vascular anatomy. Multidetector CT imaging of the abdomen and pelvis was performed using the standard protocol during bolus administration of intravenous contrast. CONTRAST:  25mL OMNIPAQUE IOHEXOL 350 MG/ML SOLN COMPARISON:  None. FINDINGS: CTA CHEST FINDINGS The lungs are well aerated bilaterally. No focal infiltrate, effusion or pneumothorax is noted. The thoracic inlet is within normal limits. Thoracic aorta shows no evidence of dissection. The pulmonary artery shows a normal branching pattern. No filling defects to suggest  pulmonary embolism are identified. Coronary calcifications are noted. Some air is noted within the left chest wall anteriorly consistent with the recent surgery. No acute bony abnormality is noted. CT ABDOMEN and PELVIS FINDINGS The liver, gallbladder, spleen, adrenal glands and pancreas are within normal limits. The kidneys are well visualized bilaterally. A hypodense lesion is noted in the left kidney likely representing a cyst but incompletely evaluated on this exam. It measures approximately 1 cm. Some scattered free air is noted as well as air within the abdominal wall consistent with the recent surgery. Fluid is noted posterior to the right kidney and extending along the right psoas muscle into the pelvis likely related to postoperative change. This does not appear to be acute in nature. The bladder is decompressed by Foley catheter. Changes are noted in the pelvic wall particularly on the right consistent with hemorrhage. This appears to be more acute with respect to the fluid in the right retroperitoneum which appears more chronic. No acute bony abnormality is noted. Scattered diverticular change is noted without diverticulitis. Review of the MIP images confirms the above findings. IMPRESSION: Postsurgical  changes are noted consistent with prostatectomy. Some postoperative hemorrhage is noted in the right pelvic wall. The overall area measures approximately 11 by 5 cm in greatest AP and transverse dimensions. Some more chronic appearing fluid is noted extending superiorly along the right psoas muscle posterior to the kidney. This is also likely related to the recent surgery. Followup imaging is recommended to assess for resolution No evidence of pulmonary embolism. Hypodensity within the left kidney. This may represent a small cyst. Nonemergent workup can be performed as clinically indicated. Electronically Signed   By: Inez Catalina M.D.   On: 03/05/2015 17:42   Ct Abdomen Pelvis W Contrast  03/05/2015  CLINICAL DATA:  Recent prostatectomy with chest pain and shoulder pain, initial encounter EXAM: CT ANGIOGRAPHY CHEST CT ABDOMEN AND PELVIS WITH CONTRAST TECHNIQUE: Multidetector CT imaging of the chest was performed using the standard protocol during bolus administration of intravenous contrast. Multiplanar CT image reconstructions and MIPs were obtained to evaluate the vascular anatomy. Multidetector CT imaging of the abdomen and pelvis was performed using the standard protocol during bolus administration of intravenous contrast. CONTRAST:  53mL OMNIPAQUE IOHEXOL 350 MG/ML SOLN COMPARISON:  None. FINDINGS: CTA CHEST FINDINGS The lungs are well aerated bilaterally. No focal infiltrate, effusion or pneumothorax is noted. The thoracic inlet is within normal limits. Thoracic aorta shows no evidence of dissection. The pulmonary artery shows a normal branching pattern. No filling defects to suggest pulmonary embolism are identified. Coronary calcifications are noted. Some air is noted within the left chest wall anteriorly consistent with the recent surgery. No acute bony abnormality is noted. CT ABDOMEN and PELVIS FINDINGS The liver, gallbladder, spleen, adrenal glands and pancreas are within normal limits. The kidneys  are well visualized bilaterally. A hypodense lesion is noted in the left kidney likely representing a cyst but incompletely evaluated on this exam. It measures approximately 1 cm. Some scattered free air is noted as well as air within the abdominal wall consistent with the recent surgery. Fluid is noted posterior to the right kidney and extending along the right psoas muscle into the pelvis likely related to postoperative change. This does not appear to be acute in nature. The bladder is decompressed by Foley catheter. Changes are noted in the pelvic wall particularly on the right consistent with hemorrhage. This appears to be more acute with respect to the fluid in the right  retroperitoneum which appears more chronic. No acute bony abnormality is noted. Scattered diverticular change is noted without diverticulitis. Review of the MIP images confirms the above findings. IMPRESSION: Postsurgical changes are noted consistent with prostatectomy. Some postoperative hemorrhage is noted in the right pelvic wall. The overall area measures approximately 11 by 5 cm in greatest AP and transverse dimensions. Some more chronic appearing fluid is noted extending superiorly along the right psoas muscle posterior to the kidney. This is also likely related to the recent surgery. Followup imaging is recommended to assess for resolution No evidence of pulmonary embolism. Hypodensity within the left kidney. This may represent a small cyst. Nonemergent workup can be performed as clinically indicated. Electronically Signed   By: Inez Catalina M.D.   On: 03/05/2015 17:42   Dg Abd Acute W/chest  03/05/2015  CLINICAL DATA:  Right chest pain and shortness of breath with fever. Radical prostatectomy and bilateral pelvic lymph node dissection 2 days ago. EXAM: DG ABDOMEN ACUTE W/ 1V CHEST COMPARISON:  07/29/2012 chest radiographs FINDINGS: The cardiomediastinal silhouette is unremarkable. There is no evidence of pleural effusion,  pneumothorax or airspace disease. The bowel gas pattern is unremarkable. No dilated bowel loops are noted. A small amount of pneumoperitoneum underlying the right hemidiaphragm noted. Catheter overlying the lower pelvis is noted. No acute or suspicious bony abnormalities noted. IMPRESSION: Small amount of pneumoperitoneum which is most likely postoperative. Correlate clinically. Unremarkable bowel gas pattern.  No evidence of bowel obstruction. No evidence of acute cardiopulmonary disease. Electronically Signed   By: Margarette Canada M.D.   On: 03/05/2015 14:08   I have personally reviewed and evaluated these images and lab results as part of my medical decision-making.   EKG Interpretation   Date/Time:  Sunday March 05 2015 15:00:49 EST Ventricular Rate:  116 PR Interval:  197 QRS Duration: 129 QT Interval:  330 QTC Calculation: 458 R Axis:   -38 Text Interpretation:  Sinus tachycardia Ventricular bigeminy Nonspecific  IVCD with LAD Nonspecific T abnormalities, lateral leads Confirmed by  Jash Wahlen MD, Corene Cornea [phone]) on 03/05/2015 4:25:43 PM      MDM   Final diagnoses:  None  fever Leukocytosis Chest pain Shoulder pain    Likely referred pain from abdominal surgery. Will tx and eval for pe/acs.  Found to have leukocytosis, fever, mildly elevated troponin. Likely demand from surgery, however with unclear cause of his pleuritic r chest pain, will admit to medicine for delta troponins, monitoring and observation.     Merrily Pew, MD 03/05/15 2045

## 2015-03-05 NOTE — ED Notes (Signed)
MD at bedside. 

## 2015-03-05 NOTE — ED Notes (Signed)
Pt had prostate removed on Friday.  Pt discharged home this morning.  Pt started c/o pain to rt shoulder coming from abdomen.  Pt had shortness of breath prior to discharge but it continues.  Pt is concerned for cardiac d/t previous hx of stents.

## 2015-03-05 NOTE — Progress Notes (Signed)
Patient ID: Jesse Mann, male   DOB: [birth year], 75 y.o.   MRN: [id number] Urinalysis still pending. Patient was started on Rocephin 1 g every 24 hours.

## 2015-03-05 NOTE — Progress Notes (Signed)
D/C instructions reviewed w/ pt and wife. Both verbalize understanding and all questions answered. Foley/leg bag teaching reviewed and both demonstrate competent care. Pt d/c in stable condition in w/c by NT to son's car. Pt in possession of d/c instructions, scripts, foley/leg bag supplies, and all personal belongings.

## 2015-03-05 NOTE — Progress Notes (Signed)
Patient ID: Jesse Mann, male   DOB: October 20, 1940, 75 y.o.   MRN: [id number]  2 Days Post-Op Subjective: Pt doing much better s/p blood transfusion.  He was able to ambulate last night without dizziness or other problems.  Pain controlled although he does describe some tenderness in lower abdomen.  Epigastric discomfort resolved.  He has been passing flatus and had a bowel movement this morning.  Objective: Vital signs in last 24 hours: Temp:  [97.3 F (36.3 C)-99 F (37.2 C)] 99 F (37.2 C) (02/26 0448) Pulse Rate:  [40-157] 53 (02/26 0448) Resp:  [16-18] 18 (02/26 0448) BP: (63-139)/(44-77) 121/74 mmHg (02/26 0448) SpO2:  [96 %-100 %] 99 % (02/26 0448)  Intake/Output from previous day: 02/25 0701 - 02/26 0700 In: 3558.4 [P.O.:480; I.V.:2208.4; Blood:770; IV Piggyback:100] Out: [id number] [Urine:825; Drains:90] Intake/Output this shift:    Physical Exam:  General: Alert and oriented CV: RRR Lungs: Clear Abdomen: Soft, ND, NT, Positive BS GU: Some ecchymosis over penis and left thigh likely manifestation from postoperative bleeding (pt advised that this likely will get somewhat worse at home as he recovers) Incisions: C/D/I Ext: NT, No erythema  Lab Results:  Recent Labs  03/04/15 0627 03/04/15 1803 03/05/15 0508  HGB 8.4* 9.9* 9.5*  HCT 25.2* 29.7* 28.1*   BMET  Recent Labs  03/04/15 0236 03/05/15 0508  NA 134* 134*  K 4.9 4.4  CL 104 104  CO2 21* 23  GLUCOSE 277* 137*  BUN 30* 42*  CREATININE 1.76* 1.94*  CALCIUM 7.7* 8.4*     Studies/Results: No results found.  Assessment/Plan: POD # 2 s/p RAL RP - Hgb stable after blood transfusion and patient has been completely HD stable overnight.   - D/C IV - D/C drain - D/C home if doing well this morning - F/U later this week to recheck renal function and possible catheter removal   LOS: 2 days   Calayah Guadarrama,LES 03/05/2015, 8:38 AM

## 2015-03-05 NOTE — ED Notes (Signed)
Patient transported to CT 

## 2015-03-06 ENCOUNTER — Encounter: Payer: Self-pay | Admitting: Medical Oncology

## 2015-03-06 ENCOUNTER — Encounter (HOSPITAL_COMMUNITY): Payer: Self-pay | Admitting: Urology

## 2015-03-06 DIAGNOSIS — R7989 Other specified abnormal findings of blood chemistry: Secondary | ICD-10-CM

## 2015-03-06 DIAGNOSIS — C61 Malignant neoplasm of prostate: Secondary | ICD-10-CM | POA: Diagnosis not present

## 2015-03-06 DIAGNOSIS — N390 Urinary tract infection, site not specified: Secondary | ICD-10-CM

## 2015-03-06 DIAGNOSIS — R791 Abnormal coagulation profile: Secondary | ICD-10-CM | POA: Diagnosis not present

## 2015-03-06 DIAGNOSIS — Z9079 Acquired absence of other genital organ(s): Secondary | ICD-10-CM

## 2015-03-06 DIAGNOSIS — R079 Chest pain, unspecified: Secondary | ICD-10-CM

## 2015-03-06 LAB — CBC WITH DIFFERENTIAL/PLATELET
BASOS ABS: 0 10*3/uL (ref 0.0–0.1)
BASOS PCT: 0 %
EOS ABS: 0.4 10*3/uL (ref 0.0–0.7)
Eosinophils Relative: 5 %
HCT: 22.7 % — ABNORMAL LOW (ref 39.0–52.0)
Hemoglobin: 7.7 g/dL — ABNORMAL LOW (ref 13.0–17.0)
Lymphocytes Relative: 15 %
Lymphs Abs: 1.2 10*3/uL (ref 0.7–4.0)
MCH: 30.7 pg (ref 26.0–34.0)
MCHC: 33.9 g/dL (ref 30.0–36.0)
MCV: 90.4 fL (ref 78.0–100.0)
MONO ABS: 1.2 10*3/uL — AB (ref 0.1–1.0)
MONOS PCT: 15 %
NEUTROS ABS: 5.2 10*3/uL (ref 1.7–7.7)
NEUTROS PCT: 65 %
PLATELETS: 124 10*3/uL — AB (ref 150–400)
RBC: 2.51 MIL/uL — ABNORMAL LOW (ref 4.22–5.81)
RDW: 13.9 % (ref 11.5–15.5)
WBC: 8 10*3/uL (ref 4.0–10.5)

## 2015-03-06 LAB — URINE MICROSCOPIC-ADD ON

## 2015-03-06 LAB — TYPE AND SCREEN
ABO/RH(D): O POS
ANTIBODY SCREEN: NEGATIVE
UNIT DIVISION: 0
Unit division: 0

## 2015-03-06 LAB — URINALYSIS, ROUTINE W REFLEX MICROSCOPIC
Bilirubin Urine: NEGATIVE
GLUCOSE, UA: NEGATIVE mg/dL
KETONES UR: NEGATIVE mg/dL
LEUKOCYTES UA: NEGATIVE
NITRITE: NEGATIVE
PROTEIN: NEGATIVE mg/dL
Specific Gravity, Urine: 1.023 (ref 1.005–1.030)
pH: 5 (ref 5.0–8.0)

## 2015-03-06 LAB — COMPREHENSIVE METABOLIC PANEL
ALBUMIN: 2.7 g/dL — AB (ref 3.5–5.0)
ALT: 9 U/L — ABNORMAL LOW (ref 17–63)
AST: 22 U/L (ref 15–41)
Alkaline Phosphatase: 26 U/L — ABNORMAL LOW (ref 38–126)
Anion gap: 5 (ref 5–15)
BUN: 27 mg/dL — ABNORMAL HIGH (ref 6–20)
CALCIUM: 7.8 mg/dL — AB (ref 8.9–10.3)
CO2: 25 mmol/L (ref 22–32)
CREATININE: 1.08 mg/dL (ref 0.61–1.24)
Chloride: 110 mmol/L (ref 101–111)
GFR calc Af Amer: >60 mL/min (ref >60)
GLUCOSE: 102 mg/dL — AB (ref 65–99)
Potassium: 4.7 mmol/L (ref 3.5–5.1)
Sodium: 140 mmol/L (ref 135–145)
TOTAL PROTEIN: 4.7 g/dL — AB (ref 6.5–8.1)
Total Bilirubin: 0.6 mg/dL (ref 0.3–1.2)

## 2015-03-06 LAB — APTT: aPTT: 33 seconds (ref 24–37)

## 2015-03-06 LAB — TROPONIN I
TROPONIN I: 0.05 ng/mL — AB (ref <0.031)
TROPONIN I: 0.05 ng/mL — AB (ref <0.031)

## 2015-03-06 NOTE — Progress Notes (Signed)
Patient is having frequent multifocal PVC's, 1st degree heart block. Patient is asymptomatic. On call PA [place] was notified. No new orders made. Will continue to monitor patient.

## 2015-03-06 NOTE — Addendum Note (Signed)
Addendum  created 03/06/15 0803 by Lollie Sails, CRNA   Modules edited: Charges VN

## 2015-03-06 NOTE — Progress Notes (Signed)
Pt had 8 beats of vtach. Asymptomatic. MD notified.

## 2015-03-06 NOTE — Progress Notes (Signed)
Oncology Nurse Navigator Documentation  Oncology Nurse Navigator Flowsheets 01/30/2015 01/30/2015 03/06/2015  Navigator Location - - -  Navigator Encounter Type Telephone Telephone Treatment  Telephone Outgoing Call;Clinic/MDC Follow-up Outgoing Call;Appt Confirmation/Clarification -  Abnormal Finding Date - - -  Confirmed Diagnosis Date - - -  Surgery Date - - 03/03/2015  Patient Visit Type - - Inpatient- I met  Jesse Mann in Prostate [facility] 01/20/15.On 03/03/15 he under went a robotic prostatectomy and was discharged home 03/04/15. He states on his way home he developed right sided chest pain and shortness of breath. He returned to the ER and was admitted. He states that he is improving and hopes to be discharged tomorrow. He had a CT that showed a small post op bleed and he did receive 2 units of blood which did make him feel better.  Due to the bleed he may have to keep his catheter an extra week due to the bleed. Glad he is improving and  I will continue to follow him. I asked him and his daughter to call me with any questions or concerns. They voiced understanding and thanked me for coming by to see him.  Treatment Phase Follow-up - -  Barriers/Navigation Needs No barriers at this time - -  Interventions - Coordination of Care -  Coordination of Care - Appts -  Support Groups/Services - - Friends and Family  Acuity - - Level 2  Acuity Level 2 - - Ongoing guidance and education throughout treatment as needed  Time Spent with Patient 15 15 30    

## 2015-03-06 NOTE — Progress Notes (Signed)
Patient ID: Jesse Mann, male   DOB: 12/24/1940, 75 y.o.   MRN: [id number] [facility] PROGRESS NOTE  [name] [id number] DOB: [birth year] DOA: 03/05/2015 PCP: [name], MD  Brief narrative:    75 y.o. male with a past medical history of sleep apnea, CAD status post MI, cardiac dysrhythmia, prostate cancer, just discharged 2/26 from the hospital after prostatectomy. Pt returned to the hospital shortly after being discharged due to right-sided chest and shoulder pain, constant, worsened by deep inspiration and relieved by morphine.   In the emergency department, patient was found to be febrile, workup showed elevated d-dimer, mildly elevated troponin, anemia, mild leukocytosis. CT scan of the abdomen showed a right pelvic hematoma and edema along the right psoas muscle. His cardiac telemetry has also shown bigeminy, but the patient stated that he has had this in the past. GU has seen the pt in consultation.  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 - No reports of chest pain  Active Problems:   Prostate cancer ([name]) / S/P prostatectomy - Appreciate urology seeing the patient in consultation - No surgical intervention planned at this time - Continue observation    Elevated d-dimer - No pulmonary embolism on CT scan    UTI - Continue Rocephin empirically - Follow up urine culture results    Acute renal failure superimposed on chronic kidney disease stage III - Recent creatinine about 2 days ago was 1.7 and on this admission 1.9 - Likely reflective of recent surgery, UTI - Continue IV fluids - Creatinine subsequently normalized    Anemia of chronic disease / acute blood loss anemia - Likely secondary to combination of prostate  cancer and chronic kidney disease - Hemoglobin is down to 7.7 - Transfuse if hemoglobin is less than 7    Thrombocytopenia - Secondary to history of malignancy  - Using SCDs for DVT prophylaxis    DVT Prophylaxis  - SCDs bilaterally due to recent bleeding  Code Status: Full.  Family Communication:  plan of care discussed with the patient Disposition Plan: Possibly by 3/1 if urine culture results are back   IV access:  Peripheral IV  Procedures and diagnostic studies:    Ct Angio Chest Pe W/cm &/or Wo Cm 03/05/2015  Postsurgical changes are noted consistent with prostatectomy. Some postoperative hemorrhage is noted in the right pelvic wall. The overall area measures approximately 11 by 5 cm in greatest AP and transverse dimensions. Some more chronic appearing fluid is noted extending superiorly along the right psoas muscle posterior to the kidney. This is also likely related to the recent surgery. Followup imaging is recommended to assess for resolution No evidence of pulmonary embolism. Hypodensity within the left kidney. This may represent a small cyst. Nonemergent workup can be performed as clinically indicated. Electronically Signed   By: Inez Catalina M.D.   On: 03/05/2015 17:42   Ct Abdomen Pelvis W Contrast 03/05/2015   Postsurgical changes are noted consistent with prostatectomy. Some postoperative hemorrhage is noted in the right pelvic wall. The overall area measures approximately 11 by 5 cm in greatest AP and transverse dimensions. Some more chronic appearing fluid is noted extending superiorly along the right psoas muscle posterior to the kidney. This is also likely related  to the recent surgery. Followup imaging is recommended to assess for resolution No evidence of pulmonary embolism. Hypodensity within the left kidney. This may represent a small cyst. Nonemergent workup can be performed as clinically indicated. Electronically Signed   By: Inez Catalina M.D.   On: 03/05/2015 17:42    Dg Abd Acute W/chest 03/05/2015  Small amount of pneumoperitoneum which is most likely postoperative. Correlate clinically. Unremarkable bowel gas pattern.  No evidence of bowel obstruction. No evidence of acute cardiopulmonary disease. Electronically Signed   By: Margarette Canada M.D.   On: 03/05/2015 14:08    Medical Consultants:  Urology   IAnti-Infectives:   Rocephin 03/05/2015 -->   [name], MD  [facility] Pager [phone]  Time spent in minutes: 25 minutes  If 7PM-7AM, please contact night-coverage [email or web address] Password [facility] 03/06/2015, 12:34 PM      HPI/Subjective: No acute overnight events. Patient reports pain on right side of abd is 5/10 this am.   Objective: Filed Vitals:   03/05/15 1900 03/05/15 2014 03/05/15 2017 03/06/15 0543  BP: 107/69 130/68  113/66  Pulse:  87  70  Temp:  99.5 F (37.5 C)  98.4 F (36.9 C)  TempSrc:  Oral  Oral  Resp: 26 20  18   Height:   5\' 6"  (1.676 m)   Weight:   71 kg (156 lb 8.4 oz)   SpO2:  97%  98%    Intake/Output Summary (Last 24 hours) at 03/06/15 1234 Last data filed at 03/06/15 0600  Gross per 24 hour  Intake    990 ml  Output   2360 ml  Net  -1370 ml    Exam:   General:  Pt is alert, follows commands appropriately, not in acute distress  Cardiovascular: Regular rate and rhythm, S1/S2 (+)  Respiratory: Clear to auscultation bilaterally, no wheezing, no crackles, no rhonchi  Abdomen: Soft, non tender, non distended, bowel sounds present  Extremities: No edema, pulses palpable bilaterally  Neuro: Grossly nonfocal  Data Reviewed: Basic Metabolic Panel:  Recent Labs Lab 02/28/15 1200 03/04/15 0236 03/05/15 0508 03/05/15 1408 03/05/15 2041 03/06/15 0242  NA 140 134* 134* 135  --  140  K 4.4 4.9 4.4 4.7  --  4.7  CL 107 104 104 104  --  110  CO2 27 21* 23 25  --  25  GLUCOSE 105* 277* 137* 115*  --  102*  BUN 18 30* 42* 35*  --  27*  CREATININE 1.14 1.76* 1.94* 1.52*  --  1.08  CALCIUM  9.4 7.7* 8.4* 8.4*  --  7.8*  MG  --   --   --   --  1.8  --    Liver Function Tests:  Recent Labs Lab 02/28/15 1200 03/05/15 1408 03/06/15 0242  AST 20 26 22   ALT 13* 11* 9*  ALKPHOS 46 32* 26*  BILITOT 0.6 0.9 0.6  PROT 6.8 5.5* 4.7*  ALBUMIN 4.2 3.4* 2.7*   No results for input(s): LIPASE, AMYLASE in the last 168 hours. No results for input(s): AMMONIA in the last 168 hours. CBC:  Recent Labs Lab 02/28/15 1200  03/04/15 0627 03/04/15 1803 03/05/15 0508 03/05/15 1408 03/06/15 0242  WBC 5.6  --   --   --   --  13.6* 8.0  NEUTROABS  --   --   --   --   --  10.5* 5.2  HGB 15.1  < > 8.4* 9.9* 9.5* 9.4* 7.7*  HCT 46.6  < >  25.2* 29.7* 28.1* 27.3* 22.7*  MCV 93.6  --   --   --   --  89.5 90.4  PLT 247  --   --   --   --  150 124*  < > = values in this interval not displayed. Cardiac Enzymes:  Recent Labs Lab 03/05/15 1503 03/05/15 2105 03/06/15 0242 03/06/15 0906  TROPONINI 0.06* 0.05* 0.05* 0.05*   BNP: Invalid input(s): POCBNP CBG: No results for input(s): GLUCAP in the last 168 hours.  Recent Results (from the past 240 hour(s))  Urine culture     Status: None   Collection Time: 02/28/15 12:45 PM  Result Value Ref Range Status   Specimen Description URINE, RANDOM  Final   Special Requests NONE  Final   Culture   Final    2,000 COLONIES/mL INSIGNIFICANT GROWTH Performed at [facility]    Report Status 03/02/2015 FINAL  Final     Scheduled Meds: . acidophilus  2 capsule Oral Daily  . carvedilol  3.125 mg Oral BID WC  . cefTRIAXone (ROCEPHIN)  IV  1 g Intravenous Q24H  . pantoprazole (PROTONIX) IV  40 mg Intravenous Q24H  . simvastatin  10 mg Oral QHS  . sodium chloride flush  3 mL Intravenous Q12H   Continuous Infusions: . sodium chloride 75 mL/hr at 03/05/15 2200

## 2015-03-06 NOTE — Progress Notes (Signed)
Urology Inpatient Progress Report Shob,Shoulder pain   POD#3 s/p Robotic Prostatectomy I have reviewed the patient's chart and the circumstance associated with his return to the hospital after discharge.  He has a small hematoma in his right pelvis, but no other findings. His troponin is mildly elevated.  He is also anemic.  His pleuritic chest pain has improved. Intv/Subj: Patient has minimal right shoulder pain, occurs with deep breaths  Past Medical History  Diagnosis Date  . Prostate cancer ([place])   . Cardiac murmur   . GERD (gastroesophageal reflux disease)   . Sleep apnea   . Hemorrhoids   . Myocardial infarction ([place])     7'14- [facility]- "feeling strange" eavaluated, heart cath done with stents x2 placed  . Recurrent dry cough     intermittent daily -occ. production  . 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 Cardiac arrhythmia     Dr. Revankar,cardiology [phone] [facility])   Current Facility-Administered Medications  Medication Dose Route Frequency Provider Last Rate Last Dose  . 0.9 %  sodium chloride infusion   Intravenous Continuous Reubin Milan, MD 75 mL/hr at 03/05/15 2200    . acetaminophen (TYLENOL) tablet 650 mg  650 mg Oral Q4H PRN Reubin Milan, MD      . acidophilus (RISAQUAD) capsule 2 capsule  2 capsule Oral Daily Reubin Milan, MD      . carvedilol (COREG) tablet 3.125 mg  3.125 mg Oral BID WC Reubin Milan, MD      . cefTRIAXone (ROCEPHIN) 1 g in dextrose 5 % 50 mL IVPB  1 g Intravenous Q24H Reubin Milan, MD   1 g at 03/05/15 2318  . HYDROcodone-acetaminophen (NORCO/VICODIN) 5-325 MG per tablet 1 tablet  1 tablet Oral Q4H PRN Reubin Milan, MD      . nitroGLYCERIN (NITROSTAT) SL tablet 0.4 mg  0.4 mg Sublingual Q5 min PRN Reubin Milan, MD      . ondansetron [facility]) tablet 4 mg  4 mg Oral Q6H PRN Reubin Milan, MD       Or   . ondansetron [facility]) injection 4 mg  4 mg Intravenous Q6H PRN Reubin Milan, MD      . pantoprazole (PROTONIX) injection 40 mg  40 mg Intravenous Q24H Reubin Milan, MD   40 mg at 03/05/15 2204  . simvastatin (ZOCOR) tablet 10 mg  10 mg Oral QHS Reubin Milan, MD   10 mg at 03/05/15 2157  . sodium chloride flush (NS) 0.9 % injection 3 mL  3 mL Intravenous Q12H Reubin Milan, MD   3 mL at 03/05/15 2204     Objective: Vital: Filed Vitals:   03/05/15 1900 03/05/15 2014 03/05/15 2017 03/06/15 0543  BP: 107/69 130/68  113/66  Pulse:  87  70  Temp:  99.5 F (37.5 C)  98.4 F (36.9 C)  TempSrc:  Oral  Oral  Resp: 26 20  18   Height:   5\' 6"  (1.676 m)   Weight:   71 kg (156 lb 8.4 oz)   SpO2:  97%  98%   I/Os: I/O last 3 completed shifts: In: 81 [P.O.:240; I.V.:600; IV Piggyback:150] Out: 2360 [Urine:2360]  Physical Exam:  General: Patient is in no apparent distress Lungs: Normal respiratory effort, chest expands symmetrically. GI: The abdomen is soft incisions as c/d/i Ext: medial ecchymosis bilateral on thighs/inguinal regions  Lab  Results:  Recent Labs  03/05/15 0508 03/05/15 1408 03/06/15 0242  WBC  --  13.6* 8.0  HGB 9.5* 9.4* 7.7*  HCT 28.1* 27.3* 22.7*    Recent Labs  03/05/15 0508 03/05/15 1408 03/06/15 0242  NA 134* 135 140  K 4.4 4.7 4.7  CL 104 104 110  CO2 23 25 25   GLUCOSE 137* 115* 102*  BUN 42* 35* 27*  CREATININE 1.94* 1.52* 1.08  CALCIUM 8.4* 8.4* 7.8*    Recent Labs  03/05/15 2105  INR 1.42   No results for input(s): LABURIN in the last 72 hours. Results for orders placed or performed during the hospital encounter of 02/28/15  Urine culture     Status: None   Collection Time: 02/28/15 12:45 PM  Result Value Ref Range Status   Specimen Description URINE, RANDOM  Final   Special Requests NONE  Final   Culture   Final    2,000 COLONIES/mL INSIGNIFICANT GROWTH Performed at [facility]    Report Status  03/02/2015 FINAL  Final    Studies/Results: Ct Angio Chest Pe W/cm &/or Wo Cm  03/05/2015  CLINICAL DATA:  Recent prostatectomy with chest pain and shoulder pain, initial encounter EXAM: CT ANGIOGRAPHY CHEST CT ABDOMEN AND PELVIS WITH CONTRAST TECHNIQUE: Multidetector CT imaging of the chest was performed using the standard protocol during bolus administration of intravenous contrast. Multiplanar CT image reconstructions and MIPs were obtained to evaluate the vascular anatomy. Multidetector CT imaging of the abdomen and pelvis was performed using the standard protocol during bolus administration of intravenous contrast. CONTRAST:  66mL OMNIPAQUE IOHEXOL 350 MG/ML SOLN COMPARISON:  None. FINDINGS: CTA CHEST FINDINGS The lungs are well aerated bilaterally. No focal infiltrate, effusion or pneumothorax is noted. The thoracic inlet is within normal limits. Thoracic aorta shows no evidence of dissection. The pulmonary artery shows a normal branching pattern. No filling defects to suggest pulmonary embolism are identified. Coronary calcifications are noted. Some air is noted within the left chest wall anteriorly consistent with the recent surgery. No acute bony abnormality is noted. CT ABDOMEN and PELVIS FINDINGS The liver, gallbladder, spleen, adrenal glands and pancreas are within normal limits. The kidneys are well visualized bilaterally. A hypodense lesion is noted in the left kidney likely representing a cyst but incompletely evaluated on this exam. It measures approximately 1 cm. Some scattered free air is noted as well as air within the abdominal wall consistent with the recent surgery. Fluid is noted posterior to the right kidney and extending along the right psoas muscle into the pelvis likely related to postoperative change. This does not appear to be acute in nature. The bladder is decompressed by Foley catheter. Changes are noted in the pelvic wall particularly on the right consistent with hemorrhage.  This appears to be more acute with respect to the fluid in the right retroperitoneum which appears more chronic. No acute bony abnormality is noted. Scattered diverticular change is noted without diverticulitis. Review of the MIP images confirms the above findings. IMPRESSION: Postsurgical changes are noted consistent with prostatectomy. Some postoperative hemorrhage is noted in the right pelvic wall. The overall area measures approximately 11 by 5 cm in greatest AP and transverse dimensions. Some more chronic appearing fluid is noted extending superiorly along the right psoas muscle posterior to the kidney. This is also likely related to the recent surgery. Followup imaging is recommended to assess for resolution No evidence of pulmonary embolism. Hypodensity within the left kidney. This may represent a small cyst.  Nonemergent workup can be performed as clinically indicated. Electronically Signed   By: Inez Catalina M.D.   On: 03/05/2015 17:42   Ct Abdomen Pelvis W Contrast  03/05/2015  CLINICAL DATA:  Recent prostatectomy with chest pain and shoulder pain, initial encounter EXAM: CT ANGIOGRAPHY CHEST CT ABDOMEN AND PELVIS WITH CONTRAST TECHNIQUE: Multidetector CT imaging of the chest was performed using the standard protocol during bolus administration of intravenous contrast. Multiplanar CT image reconstructions and MIPs were obtained to evaluate the vascular anatomy. Multidetector CT imaging of the abdomen and pelvis was performed using the standard protocol during bolus administration of intravenous contrast. CONTRAST:  26mL OMNIPAQUE IOHEXOL 350 MG/ML SOLN COMPARISON:  None. FINDINGS: CTA CHEST FINDINGS The lungs are well aerated bilaterally. No focal infiltrate, effusion or pneumothorax is noted. The thoracic inlet is within normal limits. Thoracic aorta shows no evidence of dissection. The pulmonary artery shows a normal branching pattern. No filling defects to suggest pulmonary embolism are identified.  Coronary calcifications are noted. Some air is noted within the left chest wall anteriorly consistent with the recent surgery. No acute bony abnormality is noted. CT ABDOMEN and PELVIS FINDINGS The liver, gallbladder, spleen, adrenal glands and pancreas are within normal limits. The kidneys are well visualized bilaterally. A hypodense lesion is noted in the left kidney likely representing a cyst but incompletely evaluated on this exam. It measures approximately 1 cm. Some scattered free air is noted as well as air within the abdominal wall consistent with the recent surgery. Fluid is noted posterior to the right kidney and extending along the right psoas muscle into the pelvis likely related to postoperative change. This does not appear to be acute in nature. The bladder is decompressed by Foley catheter. Changes are noted in the pelvic wall particularly on the right consistent with hemorrhage. This appears to be more acute with respect to the fluid in the right retroperitoneum which appears more chronic. No acute bony abnormality is noted. Scattered diverticular change is noted without diverticulitis. Review of the MIP images confirms the above findings. IMPRESSION: Postsurgical changes are noted consistent with prostatectomy. Some postoperative hemorrhage is noted in the right pelvic wall. The overall area measures approximately 11 by 5 cm in greatest AP and transverse dimensions. Some more chronic appearing fluid is noted extending superiorly along the right psoas muscle posterior to the kidney. This is also likely related to the recent surgery. Followup imaging is recommended to assess for resolution No evidence of pulmonary embolism. Hypodensity within the left kidney. This may represent a small cyst. Nonemergent workup can be performed as clinically indicated. Electronically Signed   By: Inez Catalina M.D.   On: 03/05/2015 17:42   Dg Abd Acute W/chest  03/05/2015  CLINICAL DATA:  Right chest pain and  shortness of breath with fever. Radical prostatectomy and bilateral pelvic lymph node dissection 2 days ago. EXAM: DG ABDOMEN ACUTE W/ 1V CHEST COMPARISON:  07/29/2012 chest radiographs FINDINGS: The cardiomediastinal silhouette is unremarkable. There is no evidence of pleural effusion, pneumothorax or airspace disease. The bowel gas pattern is unremarkable. No dilated bowel loops are noted. A small amount of pneumoperitoneum underlying the right hemidiaphragm noted. Catheter overlying the lower pelvis is noted. No acute or suspicious bony abnormalities noted. IMPRESSION: Small amount of pneumoperitoneum which is most likely postoperative. Correlate clinically. Unremarkable bowel gas pattern.  No evidence of bowel obstruction. No evidence of acute cardiopulmonary disease. Electronically Signed   By: Margarette Canada M.D.   On: 03/05/2015 14:08  Assessment:  High grade prostate cancer, s/p robotic prostatectomy Anemia from surgical blood loss/ post-op bleeding Pleuritic Chest pain of unclear etiology - could be assoicated with hematoma and irritation of the nerve Resolving acute renal insufficiency   Plan: HLIVF Transfuse patient only if symptomatic Monitor for additional 24hrs to ensure that he is hemodynamically stable F/u cultures and stop abx if no growth. Appreciate the help in monitoring this complex medical patient as we recover him from his radical prostatectomy for prostate cancer.  Will continue to follow   Ardis Hughs 03/06/2015, 7:53 AM

## 2015-03-07 ENCOUNTER — Telehealth: Payer: Self-pay

## 2015-03-07 DIAGNOSIS — C61 Malignant neoplasm of prostate: Secondary | ICD-10-CM | POA: Diagnosis not present

## 2015-03-07 DIAGNOSIS — Z9079 Acquired absence of other genital organ(s): Secondary | ICD-10-CM | POA: Diagnosis not present

## 2015-03-07 DIAGNOSIS — R791 Abnormal coagulation profile: Secondary | ICD-10-CM | POA: Diagnosis not present

## 2015-03-07 DIAGNOSIS — R079 Chest pain, unspecified: Secondary | ICD-10-CM | POA: Diagnosis not present

## 2015-03-07 DIAGNOSIS — R7989 Other specified abnormal findings of blood chemistry: Secondary | ICD-10-CM | POA: Diagnosis not present

## 2015-03-07 LAB — CBC
HCT: 26.9 % — ABNORMAL LOW (ref 39.0–52.0)
Hemoglobin: 8.9 g/dL — ABNORMAL LOW (ref 13.0–17.0)
MCH: 30.1 pg (ref 26.0–34.0)
MCHC: 33.1 g/dL (ref 30.0–36.0)
MCV: 90.9 fL (ref 78.0–100.0)
PLATELETS: 173 10*3/uL (ref 150–400)
RBC: 2.96 MIL/uL — AB (ref 4.22–5.81)
RDW: 13.6 % (ref 11.5–15.5)
WBC: 7.4 10*3/uL (ref 4.0–10.5)

## 2015-03-07 MED ORDER — RISAQUAD PO CAPS: 2.0000 | ORAL_CAPSULE | Freq: Every day | ORAL | Status: DC

## 2015-03-07 NOTE — Discharge Summary (Signed)
Physician Discharge Summary  Jesse Mann [id number] DOB: 05-30-1940 DOA: 03/05/2015  PCP: [name], MD  Admit date: 03/05/2015 Discharge date: 03/07/2015  Recommendations for Outpatient Follow-up:  1. Because of the bruising on the back and the extent of the bruising please hold aspirin for additional 1 week on discharge.  Discharge Diagnoses:  Principal Problem:   Chest pain Active Problems:   Prostate cancer (HCC)   Bigeminy   Anemia   Elevated troponin   Elevated d-dimer   S/P prostatectomy    Discharge Condition: stable   Diet recommendation: as tolerated   History of present illness:  75 y.o. male with a past medical history of sleep apnea, CAD status post MI, cardiac dysrhythmia, prostate cancer, just discharged 2/26 from the hospital after prostatectomy. Pt returned to the hospital shortly after being discharged due to right-sided chest and shoulder pain, constant, worsened by deep inspiration and relieved by morphine.   In the emergency department, patient was found to be febrile, workup showed elevated d-dimer, mildly elevated troponin, anemia, mild leukocytosis. CT scan of the abdomen showed a right pelvic hematoma and edema along the right psoas muscle. His cardiac telemetry has also shown bigeminy, but the patient stated that he has had this in the past. GU has seen the pt in consultation.  Hospital Course:   Assessment/Plan:    Principal Problem:  Right side shoulder and chest pain / NSTEMI - Slight elevation in troponin, 0.05 for total of 3 sets likely reflective of demand ischemia in the setting of pain and recent surgery - No reports of chest pain - Troponin level stable - Because of hematomas on flank areas will continue to hold aspirin for additional 1 week on discharge  Active Problems:  Prostate cancer ([place]) / S/P prostatectomy - Appreciate urology seeing the patient in consultation - No surgical intervention planned at this time -  Patient will follow-up in their office per scheduled appointment   Elevated d-dimer - No pulmonary embolism on CT scan   UTI - Continue Rocephin empirically - No significant growth on urine culture so will stop antibiotic prior to discharge   Acute renal failure superimposed on chronic kidney disease stage III - Recent creatinine about 2 days ago was 1.7 and on this admission 1.9 - Likely reflective of recent surgery, UTI - Creatinine subsequently normalized with IV fluids   Anemia of chronic disease / acute blood loss anemia - Likely secondary to combination of prostate cancer and chronic kidney disease - Hemoglobin is stable at 8.9   Thrombocytopenia - Secondary to history of malignancy  - Used SCDs for DVT prophylaxis    DVT Prophylaxis  - SCDs bilaterally due to bruising on the flank area   Code Status: Full.  Family Communication: plan of care discussed with the patient   IV access:  Peripheral IV  Procedures and diagnostic studies:   Ct Angio Chest Pe W/cm &/or Wo Cm 03/05/2015 Postsurgical changes are noted consistent with prostatectomy. Some postoperative hemorrhage is noted in the right pelvic wall. The overall area measures approximately 11 by 5 cm in greatest AP and transverse dimensions. Some more chronic appearing fluid is noted extending superiorly along the right psoas muscle posterior to the kidney. This is also likely related to the recent surgery. Followup imaging is recommended to assess for resolution No evidence of pulmonary embolism. Hypodensity within the left kidney. This may represent a small cyst. Nonemergent workup can be performed as clinically indicated. Electronically Signed By: Inez Catalina  M.D. On: 03/05/2015 17:42   Ct Abdomen Pelvis W Contrast 03/05/2015 Postsurgical changes are noted consistent with prostatectomy. Some postoperative hemorrhage is noted in the right pelvic wall. The overall area measures approximately 11 by 5  cm in greatest AP and transverse dimensions. Some more chronic appearing fluid is noted extending superiorly along the right psoas muscle posterior to the kidney. This is also likely related to the recent surgery. Followup imaging is recommended to assess for resolution No evidence of pulmonary embolism. Hypodensity within the left kidney. This may represent a small cyst. Nonemergent workup can be performed as clinically indicated. Electronically Signed By: Inez Catalina M.D. On: 03/05/2015 17:42   Dg Abd Acute W/chest 03/05/2015 Small amount of pneumoperitoneum which is most likely postoperative. Correlate clinically. Unremarkable bowel gas pattern. No evidence of bowel obstruction. No evidence of acute cardiopulmonary disease. Electronically Signed By: Margarette Canada M.D. On: 03/05/2015 14:08    Medical Consultants:  Urology  IAnti-Infectives:   Rocephin 03/05/2015 --> 03/07/2015   Signed:  [name], MD  [facility] 03/07/2015, 10:42 AM  Pager #: [phone]  Time spent in minutes: more than 30 minutes  Discharge Exam: Filed Vitals:   03/06/15 2135 03/07/15 0454  BP: 113/76 124/89  Pulse: 74 73  Temp: 99.8 F (37.7 C) 97.8 F (36.6 C)  Resp: 18 18   Filed Vitals:   03/06/15 0543 03/06/15 1302 03/06/15 2135 03/07/15 0454  BP: 113/66 122/60 113/76 124/89  Pulse: 70 70 74 73  Temp: 98.4 F (36.9 C) 99 F (37.2 C) 99.8 F (37.7 C) 97.8 F (36.6 C)  TempSrc: Oral Oral Oral Oral  Resp: 18 20 18 18   Height:      Weight:      SpO2: 98% 99% 95% 99%    General: Pt is alert, follows commands appropriately, not in acute distress Cardiovascular: Regular rate and rhythm, S1/S2 +, no murmurs Respiratory: Clear to auscultation bilaterally, no wheezing, no crackles, no rhonchi Abdominal: Soft, non tender, non distended, bowel sounds +, no guarding; extensive bruising on right and left back, flank area  Extremities: no edema, no cyanosis, pulses palpable  bilaterally DP and PT Neuro: Grossly nonfocal  Discharge Instructions  Discharge Instructions    Call MD for:  difficulty breathing, headache or visual disturbances    Complete by:  As directed      Call MD for:  persistant dizziness or light-headedness    Complete by:  As directed      Call MD for:  persistant nausea and vomiting    Complete by:  As directed      Call MD for:  severe uncontrolled pain    Complete by:  As directed      Diet - low sodium heart healthy    Complete by:  As directed      Discharge instructions    Complete by:  As directed   1. Because of the bruising on the back and the extent of the bruising please hold aspirin for additional 1 week on discharge.     Increase activity slowly    Complete by:  As directed             Medication List    STOP taking these medications        aspirin 325 MG EC tablet     aspirin EC 81 MG tablet     sulfamethoxazole-trimethoprim 800-160 MG tablet  Commonly known as:  BACTRIM DS,SEPTRA DS      TAKE  these medications        carvedilol 3.125 MG tablet  Commonly known as:  COREG  Take 3.125 mg by mouth 2 (two) times daily with a meal.     HYDROcodone-acetaminophen 5-325 MG tablet  Commonly known as:  NORCO  Take 1-2 tablets by mouth every 6 (six) hours as needed.     nitroGLYCERIN 0.4 MG SL tablet  Commonly known as:  NITROSTAT  Place 0.4 mg under the tongue every 5 (five) minutes as needed for chest pain.     PROBIOTIC DAILY PO  Take 1 tablet by mouth daily.     acidophilus Caps capsule  Take 2 capsules by mouth daily.     simvastatin 10 MG tablet  Commonly known as:  ZOCOR  Take 10 mg by mouth at bedtime.           Follow-up Information    Follow up with [name], MD. Schedule an appointment as soon as possible for a visit in 2 weeks.   Specialty:  Family Medicine   Why:  Follow up appt after recent hospitalization   Contact information:   [place]  [id number] [phone]        The results of significant diagnostics from this hospitalization (including imaging, microbiology, ancillary and laboratory) are listed below for reference.    Significant Diagnostic Studies: Ct Angio Chest Pe W/cm &/or Wo Cm  03/05/2015  CLINICAL DATA:  Recent prostatectomy with chest pain and shoulder pain, initial encounter EXAM: CT ANGIOGRAPHY CHEST CT ABDOMEN AND PELVIS WITH CONTRAST TECHNIQUE: Multidetector CT imaging of the chest was performed using the standard protocol during bolus administration of intravenous contrast. Multiplanar CT image reconstructions and MIPs were obtained to evaluate the vascular anatomy. Multidetector CT imaging of the abdomen and pelvis was performed using the standard protocol during bolus administration of intravenous contrast. CONTRAST:  71mL OMNIPAQUE IOHEXOL 350 MG/ML SOLN COMPARISON:  None. FINDINGS: CTA CHEST FINDINGS The lungs are well aerated bilaterally. No focal infiltrate, effusion or pneumothorax is noted. The thoracic inlet is within normal limits. Thoracic aorta shows no evidence of dissection. The pulmonary artery shows a normal branching pattern. No filling defects to suggest pulmonary embolism are identified. Coronary calcifications are noted. Some air is noted within the left chest wall anteriorly consistent with the recent surgery. No acute bony abnormality is noted. CT ABDOMEN and PELVIS FINDINGS The liver, gallbladder, spleen, adrenal glands and pancreas are within normal limits. The kidneys are well visualized bilaterally. A hypodense lesion is noted in the left kidney likely representing a cyst but incompletely evaluated on this exam. It measures approximately 1 cm. Some scattered free air is noted as well as air within the abdominal wall consistent with the recent surgery. Fluid is noted posterior to the right kidney and extending along the right psoas muscle into the pelvis likely related to postoperative change.  This does not appear to be acute in nature. The bladder is decompressed by Foley catheter. Changes are noted in the pelvic wall particularly on the right consistent with hemorrhage. This appears to be more acute with respect to the fluid in the right retroperitoneum which appears more chronic. No acute bony abnormality is noted. Scattered diverticular change is noted without diverticulitis. Review of the MIP images confirms the above findings. IMPRESSION: Postsurgical changes are noted consistent with prostatectomy. Some postoperative hemorrhage is noted in the right pelvic wall. The overall area measures approximately 11 by 5 cm in greatest AP and transverse  dimensions. Some more chronic appearing fluid is noted extending superiorly along the right psoas muscle posterior to the kidney. This is also likely related to the recent surgery. Followup imaging is recommended to assess for resolution No evidence of pulmonary embolism. Hypodensity within the left kidney. This may represent a small cyst. Nonemergent workup can be performed as clinically indicated. Electronically Signed   By: Inez Catalina M.D.   On: 03/05/2015 17:42   Ct Abdomen Pelvis W Contrast  03/05/2015  CLINICAL DATA:  Recent prostatectomy with chest pain and shoulder pain, initial encounter EXAM: CT ANGIOGRAPHY CHEST CT ABDOMEN AND PELVIS WITH CONTRAST TECHNIQUE: Multidetector CT imaging of the chest was performed using the standard protocol during bolus administration of intravenous contrast. Multiplanar CT image reconstructions and MIPs were obtained to evaluate the vascular anatomy. Multidetector CT imaging of the abdomen and pelvis was performed using the standard protocol during bolus administration of intravenous contrast. CONTRAST:  37mL OMNIPAQUE IOHEXOL 350 MG/ML SOLN COMPARISON:  None. FINDINGS: CTA CHEST FINDINGS The lungs are well aerated bilaterally. No focal infiltrate, effusion or pneumothorax is noted. The thoracic inlet is within  normal limits. Thoracic aorta shows no evidence of dissection. The pulmonary artery shows a normal branching pattern. No filling defects to suggest pulmonary embolism are identified. Coronary calcifications are noted. Some air is noted within the left chest wall anteriorly consistent with the recent surgery. No acute bony abnormality is noted. CT ABDOMEN and PELVIS FINDINGS The liver, gallbladder, spleen, adrenal glands and pancreas are within normal limits. The kidneys are well visualized bilaterally. A hypodense lesion is noted in the left kidney likely representing a cyst but incompletely evaluated on this exam. It measures approximately 1 cm. Some scattered free air is noted as well as air within the abdominal wall consistent with the recent surgery. Fluid is noted posterior to the right kidney and extending along the right psoas muscle into the pelvis likely related to postoperative change. This does not appear to be acute in nature. The bladder is decompressed by Foley catheter. Changes are noted in the pelvic wall particularly on the right consistent with hemorrhage. This appears to be more acute with respect to the fluid in the right retroperitoneum which appears more chronic. No acute bony abnormality is noted. Scattered diverticular change is noted without diverticulitis. Review of the MIP images confirms the above findings. IMPRESSION: Postsurgical changes are noted consistent with prostatectomy. Some postoperative hemorrhage is noted in the right pelvic wall. The overall area measures approximately 11 by 5 cm in greatest AP and transverse dimensions. Some more chronic appearing fluid is noted extending superiorly along the right psoas muscle posterior to the kidney. This is also likely related to the recent surgery. Followup imaging is recommended to assess for resolution No evidence of pulmonary embolism. Hypodensity within the left kidney. This may represent a small cyst. Nonemergent workup can be  performed as clinically indicated. Electronically Signed   By: Inez Catalina M.D.   On: 03/05/2015 17:42   Dg Abd Acute W/chest  03/05/2015  CLINICAL DATA:  Right chest pain and shortness of breath with fever. Radical prostatectomy and bilateral pelvic lymph node dissection 2 days ago. EXAM: DG ABDOMEN ACUTE W/ 1V CHEST COMPARISON:  07/29/2012 chest radiographs FINDINGS: The cardiomediastinal silhouette is unremarkable. There is no evidence of pleural effusion, pneumothorax or airspace disease. The bowel gas pattern is unremarkable. No dilated bowel loops are noted. A small amount of pneumoperitoneum underlying the right hemidiaphragm noted. Catheter overlying the lower pelvis  is noted. No acute or suspicious bony abnormalities noted. IMPRESSION: Small amount of pneumoperitoneum which is most likely postoperative. Correlate clinically. Unremarkable bowel gas pattern.  No evidence of bowel obstruction. No evidence of acute cardiopulmonary disease. Electronically Signed   By: Margarette Canada M.D.   On: 03/05/2015 14:08    Microbiology: Recent Results (from the past 240 hour(s))  Urine culture     Status: None   Collection Time: 02/28/15 12:45 PM  Result Value Ref Range Status   Specimen Description URINE, RANDOM  Final   Special Requests NONE  Final   Culture   Final    2,000 COLONIES/mL INSIGNIFICANT GROWTH Performed at [facility]    Report Status 03/02/2015 FINAL  Final     Labs: Basic Metabolic Panel:  Recent Labs Lab 02/28/15 1200 03/04/15 0236 03/05/15 0508 03/05/15 1408 03/05/15 2041 03/06/15 0242  NA 140 134* 134* 135  --  140  K 4.4 4.9 4.4 4.7  --  4.7  CL 107 104 104 104  --  110  CO2 27 21* 23 25  --  25  GLUCOSE 105* 277* 137* 115*  --  102*  BUN 18 30* 42* 35*  --  27*  CREATININE 1.14 1.76* 1.94* 1.52*  --  1.08  CALCIUM 9.4 7.7* 8.4* 8.4*  --  7.8*  MG  --   --   --   --  1.8  --    Liver Function Tests:  Recent Labs Lab 02/28/15 1200 03/05/15 1408  03/06/15 0242  AST 20 26 22   ALT 13* 11* 9*  ALKPHOS 46 32* 26*  BILITOT 0.6 0.9 0.6  PROT 6.8 5.5* 4.7*  ALBUMIN 4.2 3.4* 2.7*   No results for input(s): LIPASE, AMYLASE in the last 168 hours. No results for input(s): AMMONIA in the last 168 hours. CBC:  Recent Labs Lab 02/28/15 1200  03/04/15 1803 03/05/15 0508 03/05/15 1408 03/06/15 0242 03/07/15 0756  WBC 5.6  --   --   --  13.6* 8.0 7.4  NEUTROABS  --   --   --   --  10.5* 5.2  --   HGB 15.1  < > 9.9* 9.5* 9.4* 7.7* 8.9*  HCT 46.6  < > 29.7* 28.1* 27.3* 22.7* 26.9*  MCV 93.6  --   --   --  89.5 90.4 90.9  PLT 247  --   --   --  150 124* 173  < > = values in this interval not displayed. Cardiac Enzymes:  Recent Labs Lab 03/05/15 1503 03/05/15 2105 03/06/15 0242 03/06/15 0906  TROPONINI 0.06* 0.05* 0.05* 0.05*   BNP: BNP (last 3 results) No results for input(s): BNP in the last 8760 hours.  ProBNP (last 3 results) No results for input(s): PROBNP in the last 8760 hours.  CBG: No results for input(s): GLUCAP in the last 168 hours.

## 2015-03-07 NOTE — Progress Notes (Signed)
Urology Inpatient Progress Report Shob,Shoulder pain   POD#4 s/p Robotic Prostatectomy I have reviewed the patient's chart and the circumstance associated with his return to the hospital after discharge.  He has a small hematoma in his right pelvis, but no other findings. His troponin is mildly elevated.  He is also anemic.  His pleuritic chest pain has improved. Intv/Subj: Asymptomatic Vtach No other issues Shoulder pain resolved  Past Medical History  Diagnosis Date  . Prostate cancer ([place])   . Cardiac murmur   . GERD (gastroesophageal reflux disease)   . Sleep apnea   . Hemorrhoids   . Myocardial infarction ([place])     7'14- [facility]- "feeling strange" eavaluated, heart cath done with stents x2 placed  . Recurrent dry cough     intermittent daily -occ. production  . 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 Cardiac arrhythmia     Dr. Revankar,cardiology [phone] [facility])   Current Facility-Administered Medications  Medication Dose Route Frequency Provider Last Rate Last Dose  . 0.9 %  sodium chloride infusion   Intravenous Continuous Reubin Milan, MD 75 mL/hr at 03/07/15 0200    . acetaminophen (TYLENOL) tablet 650 mg  650 mg Oral Q4H PRN Reubin Milan, MD      . acidophilus (RISAQUAD) capsule 2 capsule  2 capsule Oral Daily Reubin Milan, MD   2 capsule at 03/06/15 0818  . carvedilol (COREG) tablet 3.125 mg  3.125 mg Oral BID WC Reubin Milan, MD   3.125 mg at 03/06/15 1629  . cefTRIAXone (ROCEPHIN) 1 g in dextrose 5 % 50 mL IVPB  1 g Intravenous Q24H Reubin Milan, MD   1 g at 03/06/15 2135  . HYDROcodone-acetaminophen (NORCO/VICODIN) 5-325 MG per tablet 1 tablet  1 tablet Oral Q4H PRN Reubin Milan, MD   1 tablet at 03/06/15 2142  . nitroGLYCERIN (NITROSTAT) SL tablet 0.4 mg  0.4 mg Sublingual Q5 min PRN Reubin Milan, MD      . ondansetron  [facility]) tablet 4 mg  4 mg Oral Q6H PRN Reubin Milan, MD       Or  . ondansetron [facility]) injection 4 mg  4 mg Intravenous Q6H PRN Reubin Milan, MD      . pantoprazole (PROTONIX) injection 40 mg  40 mg Intravenous Q24H Reubin Milan, MD   40 mg at 03/06/15 2135  . simvastatin (ZOCOR) tablet 10 mg  10 mg Oral QHS Reubin Milan, MD   10 mg at 03/06/15 2135  . sodium chloride flush (NS) 0.9 % injection 3 mL  3 mL Intravenous Q12H Reubin Milan, MD   3 mL at 03/06/15 2135     Objective: Vital: Filed Vitals:   03/06/15 0543 03/06/15 1302 03/06/15 2135 03/07/15 0454  BP: 113/66 122/60 113/76 124/89  Pulse: 70 70 74 73  Temp: 98.4 F (36.9 C) 99 F (37.2 C) 99.8 F (37.7 C) 97.8 F (36.6 C)  TempSrc: Oral Oral Oral Oral  Resp: 18 20 18 18   Height:      Weight:      SpO2: 98% 99% 95% 99%   I/Os: I/O last 3 completed shifts: In: 3035 [P.O.:360; I.V.:2475; IV Piggyback:200] Out: [id number] [Urine:3460]  Physical Exam:  General: Patient is in no apparent distress Ecchymosis along right flank Lungs: Normal respiratory effort, chest expands symmetrically. GI: The abdomen is soft incisions as c/d/i  Ext: medial ecchymosis bilateral on thighs/inguinal regions  Lab Results:  Recent Labs  03/05/15 0508 03/05/15 1408 03/06/15 0242  WBC  --  13.6* 8.0  HGB 9.5* 9.4* 7.7*  HCT 28.1* 27.3* 22.7*    Recent Labs  03/05/15 0508 03/05/15 1408 03/06/15 0242  NA 134* 135 140  K 4.4 4.7 4.7  CL 104 104 110  CO2 23 25 25   GLUCOSE 137* 115* 102*  BUN 42* 35* 27*  CREATININE 1.94* 1.52* 1.08  CALCIUM 8.4* 8.4* 7.8*    Recent Labs  03/05/15 2105  INR 1.42   No results for input(s): LABURIN in the last 72 hours. Results for orders placed or performed during the hospital encounter of 02/28/15  Urine culture     Status: None   Collection Time: 02/28/15 12:45 PM  Result Value Ref Range Status   Specimen Description URINE, RANDOM  Final   Special Requests  NONE  Final   Culture   Final    2,000 COLONIES/mL INSIGNIFICANT GROWTH Performed at [facility]    Report Status 03/02/2015 FINAL  Final    Studies/Results: Ct Angio Chest Pe W/cm &/or Wo Cm  03/05/2015  CLINICAL DATA:  Recent prostatectomy with chest pain and shoulder pain, initial encounter EXAM: CT ANGIOGRAPHY CHEST CT ABDOMEN AND PELVIS WITH CONTRAST TECHNIQUE: Multidetector CT imaging of the chest was performed using the standard protocol during bolus administration of intravenous contrast. Multiplanar CT image reconstructions and MIPs were obtained to evaluate the vascular anatomy. Multidetector CT imaging of the abdomen and pelvis was performed using the standard protocol during bolus administration of intravenous contrast. CONTRAST:  38mL OMNIPAQUE IOHEXOL 350 MG/ML SOLN COMPARISON:  None. FINDINGS: CTA CHEST FINDINGS The lungs are well aerated bilaterally. No focal infiltrate, effusion or pneumothorax is noted. The thoracic inlet is within normal limits. Thoracic aorta shows no evidence of dissection. The pulmonary artery shows a normal branching pattern. No filling defects to suggest pulmonary embolism are identified. Coronary calcifications are noted. Some air is noted within the left chest wall anteriorly consistent with the recent surgery. No acute bony abnormality is noted. CT ABDOMEN and PELVIS FINDINGS The liver, gallbladder, spleen, adrenal glands and pancreas are within normal limits. The kidneys are well visualized bilaterally. A hypodense lesion is noted in the left kidney likely representing a cyst but incompletely evaluated on this exam. It measures approximately 1 cm. Some scattered free air is noted as well as air within the abdominal wall consistent with the recent surgery. Fluid is noted posterior to the right kidney and extending along the right psoas muscle into the pelvis likely related to postoperative change. This does not appear to be acute in nature. The bladder is  decompressed by Foley catheter. Changes are noted in the pelvic wall particularly on the right consistent with hemorrhage. This appears to be more acute with respect to the fluid in the right retroperitoneum which appears more chronic. No acute bony abnormality is noted. Scattered diverticular change is noted without diverticulitis. Review of the MIP images confirms the above findings. IMPRESSION: Postsurgical changes are noted consistent with prostatectomy. Some postoperative hemorrhage is noted in the right pelvic wall. The overall area measures approximately 11 by 5 cm in greatest AP and transverse dimensions. Some more chronic appearing fluid is noted extending superiorly along the right psoas muscle posterior to the kidney. This is also likely related to the recent surgery. Followup imaging is recommended to assess for resolution No evidence of pulmonary embolism. Hypodensity within  the left kidney. This may represent a small cyst. Nonemergent workup can be performed as clinically indicated. Electronically Signed   By: Inez Catalina M.D.   On: 03/05/2015 17:42   Ct Abdomen Pelvis W Contrast  03/05/2015  CLINICAL DATA:  Recent prostatectomy with chest pain and shoulder pain, initial encounter EXAM: CT ANGIOGRAPHY CHEST CT ABDOMEN AND PELVIS WITH CONTRAST TECHNIQUE: Multidetector CT imaging of the chest was performed using the standard protocol during bolus administration of intravenous contrast. Multiplanar CT image reconstructions and MIPs were obtained to evaluate the vascular anatomy. Multidetector CT imaging of the abdomen and pelvis was performed using the standard protocol during bolus administration of intravenous contrast. CONTRAST:  75mL OMNIPAQUE IOHEXOL 350 MG/ML SOLN COMPARISON:  None. FINDINGS: CTA CHEST FINDINGS The lungs are well aerated bilaterally. No focal infiltrate, effusion or pneumothorax is noted. The thoracic inlet is within normal limits. Thoracic aorta shows no evidence of dissection.  The pulmonary artery shows a normal branching pattern. No filling defects to suggest pulmonary embolism are identified. Coronary calcifications are noted. Some air is noted within the left chest wall anteriorly consistent with the recent surgery. No acute bony abnormality is noted. CT ABDOMEN and PELVIS FINDINGS The liver, gallbladder, spleen, adrenal glands and pancreas are within normal limits. The kidneys are well visualized bilaterally. A hypodense lesion is noted in the left kidney likely representing a cyst but incompletely evaluated on this exam. It measures approximately 1 cm. Some scattered free air is noted as well as air within the abdominal wall consistent with the recent surgery. Fluid is noted posterior to the right kidney and extending along the right psoas muscle into the pelvis likely related to postoperative change. This does not appear to be acute in nature. The bladder is decompressed by Foley catheter. Changes are noted in the pelvic wall particularly on the right consistent with hemorrhage. This appears to be more acute with respect to the fluid in the right retroperitoneum which appears more chronic. No acute bony abnormality is noted. Scattered diverticular change is noted without diverticulitis. Review of the MIP images confirms the above findings. IMPRESSION: Postsurgical changes are noted consistent with prostatectomy. Some postoperative hemorrhage is noted in the right pelvic wall. The overall area measures approximately 11 by 5 cm in greatest AP and transverse dimensions. Some more chronic appearing fluid is noted extending superiorly along the right psoas muscle posterior to the kidney. This is also likely related to the recent surgery. Followup imaging is recommended to assess for resolution No evidence of pulmonary embolism. Hypodensity within the left kidney. This may represent a small cyst. Nonemergent workup can be performed as clinically indicated. Electronically Signed   By: Inez Catalina M.D.   On: 03/05/2015 17:42   Dg Abd Acute W/chest  03/05/2015  CLINICAL DATA:  Right chest pain and shortness of breath with fever. Radical prostatectomy and bilateral pelvic lymph node dissection 2 days ago. EXAM: DG ABDOMEN ACUTE W/ 1V CHEST COMPARISON:  07/29/2012 chest radiographs FINDINGS: The cardiomediastinal silhouette is unremarkable. There is no evidence of pleural effusion, pneumothorax or airspace disease. The bowel gas pattern is unremarkable. No dilated bowel loops are noted. A small amount of pneumoperitoneum underlying the right hemidiaphragm noted. Catheter overlying the lower pelvis is noted. No acute or suspicious bony abnormalities noted. IMPRESSION: Small amount of pneumoperitoneum which is most likely postoperative. Correlate clinically. Unremarkable bowel gas pattern.  No evidence of bowel obstruction. No evidence of acute cardiopulmonary disease. Electronically Signed   By: Dellis Filbert  Hu M.D.   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 Appreciate the help in monitoring this complex medical patient as we recover him from his radical prostatectomy for prostate cancer.  Will continue to follow   Ardis Hughs 03/07/2015, 8:18 AM

## 2015-03-07 NOTE — Discharge Instructions (Signed)
Anemia, Nonspecific Anemia is a condition in which the concentration of red blood cells or hemoglobin in the blood is below normal. Hemoglobin is a substance in red blood cells that carries oxygen to the tissues of the body. Anemia results in not enough oxygen reaching these tissues.  CAUSES  Common causes of anemia include:   Excessive bleeding. Bleeding may be internal or external. This includes excessive bleeding from periods (in women) or from the intestine.   Poor nutrition.   Chronic kidney, thyroid, and liver disease.  Bone marrow disorders that decrease red blood cell production.  Cancer and treatments for cancer.  HIV, AIDS, and their treatments.  Spleen problems that increase red blood cell destruction.  Blood disorders.  Excess destruction of red blood cells due to infection, medicines, and autoimmune disorders. SIGNS AND SYMPTOMS   Minor weakness.   Dizziness.   Headache.  Palpitations.   Shortness of breath, especially with exercise.   Paleness.  Cold sensitivity.  Indigestion.  Nausea.  Difficulty sleeping.  Difficulty concentrating. Symptoms may occur suddenly or they may develop slowly.  DIAGNOSIS  Additional blood tests are often needed. These help your health care provider determine the best treatment. Your health care provider will check your stool for blood and look for other causes of blood loss.  TREATMENT  Treatment varies depending on the cause of the anemia. Treatment can include:   Supplements of iron, vitamin B12, or folic acid.   Hormone medicines.   A blood transfusion. This may be needed if blood loss is severe.   Hospitalization. This may be needed if there is significant continual blood loss.   Dietary changes.  Spleen removal. HOME CARE INSTRUCTIONS Keep all follow-up appointments. It often takes many weeks to correct anemia, and having your health care provider check on your condition and your response to  treatment is very important. SEEK IMMEDIATE MEDICAL CARE IF:   You develop extreme weakness, shortness of breath, or chest pain.   You become dizzy or have trouble concentrating.  You develop heavy vaginal bleeding.   You develop a rash.   You have bloody or black, tarry stools.   You faint.   You vomit up blood.   You vomit repeatedly.   You have abdominal pain.  You have a fever or persistent symptoms for more than 2-3 days.   You have a fever and your symptoms suddenly get worse.   You are dehydrated.  MAKE SURE YOU:  Understand these instructions.  Will watch your condition.  Will get help right away if you are not doing well or get worse.   This information is not intended to replace advice given to you by your health care provider. Make sure you discuss any questions you have with your health care provider.   Document Released: 02/01/2004 Document Revised: 08/26/2012 Document Reviewed: 06/19/2012 Elsevier Interactive Patient Education 2016 [employer]  

## 2015-03-07 NOTE — Telephone Encounter (Signed)
Patient's daughter Sharyn Lull calling to speak to Cira Rue, RN- she is requesting a call back.

## 2015-03-07 NOTE — Progress Notes (Signed)
Completed D/C teaching with patient. Discussed foley care. Went over medication list. Patient will be D/C home with family in stable condition.

## 2015-03-08 ENCOUNTER — Telehealth: Payer: Self-pay | Admitting: Medical Oncology

## 2015-03-08 NOTE — Telephone Encounter (Signed)
Oncology Nurse Navigator Documentation  Oncology Nurse Navigator Flowsheets 01/30/2015 03/06/2015 03/08/2015  Navigator Location - - -  Navigator Encounter Type Telephone Treatment Telephone  Telephone Outgoing Call;Appt Confirmation/Clarification - Incoming Call- Michelle-daughter that was present for Prostate [place] and dad's surgery called with questions regarding her father's pathology.Dr.Herrick saw her father before discharge and informed him that he had positive lymph nodes. She had not arrived so she did not get to speak with him. Her father said he will probably need radiation in the future. I explained that Dr. Louis Meckel will go over the pathology report in detail at his post op follow up. I explained that the main priority now is to get her dad healed from his surgery. I explained once this has occurred Dr. Louis Meckel will draw a PSA level and this will help him know how to proceed with further treatment. She voiced understanding and asked her to call me with any questions or concerns.   Abnormal Finding Date - - -  Confirmed Diagnosis Date - - -  Surgery Date - 03/03/2015 -  Patient Visit Type - Inpatient -  Treatment Phase - - -  Barriers/Navigation Needs - - Family concerns;Education  Education - - Understanding Cancer/ Treatment Options;Coping with Diagnosis/ Prognosis  Interventions Coordination of Care - Education Method  Coordination of Care Appts - -  Education Method - - Teach-back;Verbal  Support Groups/Services - Friends and Family Friends and Family  Acuity - Level 2 -  Acuity Level 2 - Ongoing guidance and education throughout treatment as needed -  Time Spent with Patient 15 30 30

## 2015-03-21 ENCOUNTER — Telehealth: Payer: Self-pay | Admitting: Medical Oncology

## 2015-03-21 NOTE — Telephone Encounter (Signed)
Oncology Nurse Navigator Documentation  Oncology Nurse Navigator Flowsheets 03/06/2015 03/08/2015 03/21/2015  Navigator Location - - -  Navigator Encounter Type Treatment Telephone Telephone  Telephone - Incoming Call Outgoing Call  Abnormal Finding Date - - -  Confirmed Diagnosis Date - - -  Surgery Date 03/03/2015 - -  Patient Visit Type Inpatient - -  Treatment Phase - - -  Barriers/Navigation Needs - Family concerns;Education -  Education - Understanding Cancer/ Treatment Options;Coping with Diagnosis/ Prognosis Other- I called Mr. Mcquaide to follow up with him after his latest hospitalization. He states he is just beginning to feel better. He saw Dr. Louis Meckel and got his catheter removed. He is doing his pelvic exercises at home. He feels a little discouraged because he feels he should be further along. I stressed to him he is just had surgery 3/24, had to get transfused and then was readmitted for chest pain. He states his appetite is getting better, his bruise from the hematoma is improving and he is able to climb the stairs to his bedroom. I told him he is doing well and it will take time to recover. He voiced how much my call meant to him and I asked him to call me with any concerns or questions. I will continue to follow.  Interventions - Education Method -  Coordination of Care - - -  Education Method - Teach-back;Verbal -  Support Groups/Services Friends and Family Friends and Family Friends and Family  Acuity Level 2 - -  Acuity Level 2 Ongoing guidance and education throughout treatment as needed - -  Time Spent with Patient [id number]

## 2015-03-27 DIAGNOSIS — C61 Malignant neoplasm of prostate: Secondary | ICD-10-CM | POA: Diagnosis not present

## 2015-03-27 DIAGNOSIS — Z1389 Encounter for screening for other disorder: Secondary | ICD-10-CM | POA: Diagnosis not present

## 2015-03-27 DIAGNOSIS — N99840 Postprocedural hematoma of a genitourinary system organ or structure following a genitourinary system procedure: Secondary | ICD-10-CM | POA: Diagnosis not present

## 2015-04-24 DIAGNOSIS — N99840 Postprocedural hematoma of a genitourinary system organ or structure following a genitourinary system procedure: Secondary | ICD-10-CM | POA: Diagnosis not present

## 2015-04-24 DIAGNOSIS — I255 Ischemic cardiomyopathy: Secondary | ICD-10-CM | POA: Diagnosis not present

## 2015-04-24 DIAGNOSIS — I420 Dilated cardiomyopathy: Secondary | ICD-10-CM | POA: Diagnosis not present

## 2015-04-24 DIAGNOSIS — C61 Malignant neoplasm of prostate: Secondary | ICD-10-CM | POA: Diagnosis not present

## 2015-04-25 DIAGNOSIS — C61 Malignant neoplasm of prostate: Secondary | ICD-10-CM | POA: Diagnosis not present

## 2015-04-25 DIAGNOSIS — Z0000 Encounter for general adult medical examination without abnormal findings: Secondary | ICD-10-CM | POA: Diagnosis not present

## 2015-07-05 ENCOUNTER — Telehealth: Payer: Self-pay | Admitting: Medical Oncology

## 2015-07-05 NOTE — Telephone Encounter (Signed)
Oncology Nurse Navigator Documentation  Oncology Nurse Navigator Flowsheets 03/21/2015 07/05/2015 07/05/2015  Navigator Location - - -  Navigator Encounter Type Telephone Telephone;6 month Telephone  Telephone Outgoing Call Outgoing Call Incoming Call  Abnormal Finding Date - - -  Confirmed Diagnosis Date - - -  Surgery Date - - -  Patient Visit Type - - -  Treatment Phase - - Follow-up- Jesse Mann states he is doing well but has occasional incontinence when straining or coughing. I explained this is normal and this will continue to improve with time. I encouraged him to continue his exercises. He voiced understanding. He follows up with Dr.Herrick 07/24/15. I asked him to call me with any questions or concerns.   Barriers/Navigation Needs - - No barriers at this time  Education Other - -  Interventions - - None required  Coordination of Care - - -  Education Method - - -  Support Groups/Services Friends and Family - -  Acuity - - -  Acuity Level 2 - - -  Time Spent with Patient 15 15 15

## 2015-07-05 NOTE — Telephone Encounter (Signed)
Attempted to reach patient to follow up with him post prostatectomy 03/03/15. No answer or answering machine.

## 2015-07-18 DIAGNOSIS — C61 Malignant neoplasm of prostate: Secondary | ICD-10-CM | POA: Diagnosis not present

## 2015-07-20 DIAGNOSIS — D509 Iron deficiency anemia, unspecified: Secondary | ICD-10-CM | POA: Diagnosis not present

## 2015-07-20 DIAGNOSIS — Z8546 Personal history of malignant neoplasm of prostate: Secondary | ICD-10-CM | POA: Diagnosis not present

## 2015-07-24 DIAGNOSIS — Z8546 Personal history of malignant neoplasm of prostate: Secondary | ICD-10-CM | POA: Diagnosis not present

## 2015-07-28 ENCOUNTER — Telehealth: Payer: Self-pay | Admitting: Hematology

## 2015-07-28 ENCOUNTER — Telehealth: Payer: Self-pay | Admitting: Medical Oncology

## 2015-07-28 ENCOUNTER — Encounter: Payer: Self-pay | Admitting: Medical Oncology

## 2015-07-28 NOTE — Telephone Encounter (Signed)
Oncology Nurse Navigator Documentation  Oncology Nurse Navigator Flowsheets 07/05/2015 07/05/2015 07/28/2015  Navigator Location - - -  Navigator Encounter Type Telephone;6 month Telephone Telephone  Telephone Outgoing Call Incoming Call Outgoing Call;Appt Confirmation/Clarification  Abnormal Finding Date - - -  Confirmed Diagnosis Date - - -  Surgery Date - - -  Patient Visit Type - - -  Treatment Phase - Follow-up -  Barriers/Navigation Needs - No barriers at this time Coordination of Care-Jesse Mann referred back to Dr. Alen Blew by Dr. Louis Meckel. I spoke with him to verify referral. I gave appointment for 08/01/15 at 8:45am. He states that he has Jesse Mann and will need a referral. I will follow up on this. He asked several questions regarding hormone therapy. I discussed this with him and encouraged him to write all his questions down and bring to appointment and to bring a family member. He voiced understanding.  Education - - -  Interventions - None required Coordination of Care  Coordination of Care - - Appts  Education Method - - -  Support Groups/Services - - -  Acuity - - -  Acuity Level 2 - - -  Time Spent with Patient 15 15 15

## 2015-07-28 NOTE — Telephone Encounter (Signed)
per Robin/nagivator to sch appt per dr Cindee Salt est

## 2015-07-28 NOTE — Progress Notes (Signed)
Oncology Nurse Navigator Documentation  Oncology Nurse Navigator Flowsheets 07/05/2015 07/28/2015 07/28/2015  Navigator Location - - -  Navigator Encounter Type Telephone Telephone -  Telephone Incoming Call Outgoing Call;Appt Confirmation/Clarification -  Abnormal Finding Date - - -  Confirmed Diagnosis Date - - -  Surgery Date - - -  Patient Visit Type - - -  Treatment Phase Follow-up - -  Barriers/Navigation Needs No barriers at this time Education;Coordination of Care Coordination of Care  Education - Understanding Cancer/ Treatment Options -  Interventions None required Coordination of Care;Education Method Coordination of Care  Coordination of Care - Appts Appts- I spoke with Annamary Rummage to inform her that patient will need a referral from [facility]. She will call for referral and I notified patient.  Education Method - Teach-back;Verbal -  Support Groups/Services - - -  Acuity - - -  Acuity Level 2 - - -  Time Spent with Patient 15 30 15

## 2015-07-31 NOTE — Progress Notes (Signed)
Hematology and Oncology Follow Up Visit  Jesse Mann [id number] September 07, 1940 75 y.o. 07/31/2015 3:41 PM Jesse Mann, MDScott, [name], MD   Principle Diagnosis: 29- year diagnosed with prostate cancer in December 2016. He had a Gleason score 5+4 = 9 and a PSA of 12.22. He is currently has biochemical recurrent disease.   Prior Therapy: He is status post radical prostatectomy done on 03/03/2015 utilizing a nerve sparing approach. 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 Tumor involves bilateral seminal vesicles with margins involved with cancer. 2 lymph nodes were involved for a total of 8 sampled. Seminal vesicle and vomits were also noted.  Current therapy: Under consideration for androgen deprivation.  Interim History: Jesse Mann presents today for a follow-up visit. He is a pleasant gentleman seen in the prostate cancer multidisciplinary clinic and underwent a radical prostatectomy. He is a follow-up PSA in July 2017 was up to 1.28. His April PSA 6 weeks after surgery was 0.83. Clinically, he recovered from his operation fairly well. He does have some issues of incontinence but seems to be manageable at this time. Overall health and performance status remains at baseline.  He does not report any headaches, blurry vision, syncope or seizures. He does not report any fevers or chills or sweats. He does not report any cough, wheezing or hemoptysis. He does not report any nausea, vomiting or abdominal pain. He does not report any frequency urgency or hesitancy. He does report incontinence. Remaining review of systems unremarkable.  Medications: I have reviewed the patient's current medications.  Current Outpatient Prescriptions  Medication Sig Dispense Refill  . acidophilus (RISAQUAD) CAPS capsule Take 2 capsules by mouth daily. 30 capsule 0  . carvedilol (COREG) 3.125 MG tablet Take 3.125 mg by mouth 2 (two) times daily with a meal.    .  HYDROcodone-acetaminophen (NORCO) 5-325 MG tablet Take 1-2 tablets by mouth every 6 (six) hours as needed. 30 tablet 0  . nitroGLYCERIN (NITROSTAT) 0.4 MG SL tablet Place 0.4 mg under the tongue every 5 (five) minutes as needed for chest pain.     . Probiotic Product (PROBIOTIC DAILY PO) Take 1 tablet by mouth daily.     . simvastatin (ZOCOR) 10 MG tablet Take 10 mg by mouth at bedtime.      No current facility-administered medications for this visit.      Allergies: No Known Allergies  Past Medical History, Surgical history, Social history, and Family History were reviewed and updated.   Physical Exam: Blood pressure 132/72, pulse 69, temperature 98.4 F (36.9 C), temperature source Oral, resp. rate 18, height 5\' 6"  (1.676 m), weight 149 lb 3.2 oz (67.7 kg), SpO2 99 %.   ECOG: 0 General appearance: alert and cooperative Head: Normocephalic, without obvious abnormality Neck: no adenopathy Lymph nodes: Cervical, supraclavicular, and axillary nodes normal. Heart:regular rate and rhythm, S1, S2 normal, no murmur, click, rub or gallop Lung:chest clear, no wheezing, rales, normal symmetric air entry Abdomin: soft, non-tender, without masses or organomegaly EXT:no erythema, induration, or nodules   Lab Results: Lab Results  Component Value Date   WBC 7.4 03/07/2015   HGB 8.9 (L) 03/07/2015   HCT 26.9 (L) 03/07/2015   MCV 90.9 03/07/2015   PLT 173 03/07/2015     Chemistry      Component Value Date/Time   NA 140 03/06/2015 0242   K 4.7 03/06/2015 0242   CL 110 03/06/2015 0242   CO2 25 03/06/2015 0242   BUN 27 (  H) 03/06/2015 0242   CREATININE 1.08 03/06/2015 0242      Component Value Date/Time   CALCIUM 7.8 (L) 03/06/2015 0242   ALKPHOS 26 (L) 03/06/2015 0242   AST 22 03/06/2015 0242   ALT 9 (L) 03/06/2015 0242   BILITOT 0.6 03/06/2015 0242       Impression and Plan:   75 year old gentleman with the following issues:  1. Prostate cancer diagnosed in December  2016 with a Gleason score 4+5 = 9 and a PSA of 12. He is status post radical prostatectomy in February 2017 with His disease showed T3bN1. His PSA was never 0 and went to 0.83 and currently at 1.28 in July 2017.  The natural course of this disease was discussed with the patient and his wife. He understands that he has an incurable malignancy at this time and likely his cancer will recur at some point. Given the aggressive nature of his disease, I have recommended starting androgen deprivation in the near future. Risks and benefits of this approach were discussed. Complications include hot flashes, weight gain, breast tenderness, osteoporosis among others. He is agreeable to proceed at this time.  The rationale for adding Zytiga to androgen deprivation was discussed today. Recent evidence suggesting some benefit in the biochemically recurrent prostate cancer. I discussed the risks and benefits of this medication including the added cost as well as the small percentage of patients without evidence of metastatic disease or included in this trial. After discussion today, we have elected to defer this option to a later date.  He expressed interest in receiving his androgen deprivation at the [facility] as soon as possible. And I will get that started for him in the near future.  2. Bone health: I recommended starting calcium and vitamin D supplements moving forward.  3. Follow-up: Will be in 2 months to assess his progress.   [facility], MD 7/24/20173:41 PM

## 2015-08-01 ENCOUNTER — Ambulatory Visit (HOSPITAL_BASED_OUTPATIENT_CLINIC_OR_DEPARTMENT_OTHER): Payer: Commercial Managed Care - HMO | Admitting: Oncology

## 2015-08-01 ENCOUNTER — Encounter: Payer: Self-pay | Admitting: Medical Oncology

## 2015-08-01 ENCOUNTER — Telehealth: Payer: Self-pay | Admitting: Oncology

## 2015-08-01 ENCOUNTER — Encounter: Payer: Self-pay | Admitting: Oncology

## 2015-08-01 VITALS — BP 132/72 | HR 69 | Temp 98.4°F | Resp 18 | Ht 66.0 in | Wt 149.2 lb

## 2015-08-01 DIAGNOSIS — C61 Malignant neoplasm of prostate: Secondary | ICD-10-CM

## 2015-08-01 NOTE — Telephone Encounter (Signed)
Gave pt cal & avs °

## 2015-08-08 ENCOUNTER — Encounter: Payer: Self-pay | Admitting: Medical Oncology

## 2015-08-08 ENCOUNTER — Ambulatory Visit (HOSPITAL_BASED_OUTPATIENT_CLINIC_OR_DEPARTMENT_OTHER): Payer: Commercial Managed Care - HMO

## 2015-08-08 VITALS — BP 134/71 | HR 58 | Temp 98.4°F | Resp 20

## 2015-08-08 DIAGNOSIS — C61 Malignant neoplasm of prostate: Secondary | ICD-10-CM | POA: Diagnosis not present

## 2015-08-08 DIAGNOSIS — Z5111 Encounter for antineoplastic chemotherapy: Secondary | ICD-10-CM | POA: Diagnosis not present

## 2015-08-08 MED ORDER — LEUPROLIDE ACETATE (4 MONTH) 30 MG IM KIT
30.0000 mg | PACK | Freq: Once | INTRAMUSCULAR | Status: AC
  Administered 2015-08-08: 30 mg via INTRAMUSCULAR
  Filled 2015-08-08: qty 30

## 2015-08-08 NOTE — Patient Instructions (Signed)
Leuprolide depot injection What is this medicine? LEUPROLIDE (loo PROE lide) is a man-made protein that acts like a natural hormone in the body. It decreases testosterone in men and decreases estrogen in women. In men, this medicine is used to treat advanced prostate cancer. In women, some forms of this medicine may be used to treat endometriosis, uterine fibroids, or other male hormone-related problems. This medicine may be used for other purposes; ask your health care provider or pharmacist if you have questions. What should I tell my health care provider before I take this medicine? They need to know if you have any of these conditions: -diabetes -heart disease or previous heart attack -high blood pressure -high cholesterol -osteoporosis -pain or difficulty passing urine -spinal cord metastasis -stroke -tobacco smoker -unusual vaginal bleeding (women) -an unusual or allergic reaction to leuprolide, benzyl alcohol, other medicines, foods, dyes, or preservatives -pregnant or trying to get pregnant -breast-feeding How should I use this medicine? This medicine is for injection into a muscle or for injection under the skin. It is given by a health care professional in a hospital or clinic setting. The specific product will determine how it will be given to you. Make sure you understand which product you receive and how often you will receive it. Talk to your pediatrician regarding the use of this medicine in children. Special care may be needed. Overdosage: If you think you have taken too much of this medicine contact a poison control center or emergency room at once. NOTE: This medicine is only for you. Do not share this medicine with others. What if I miss a dose? It is important not to miss a dose. Call your doctor or health care professional if you are unable to keep an appointment. Depot injections: Depot injections are given either once-monthly, every 12 weeks, every 16 weeks, or  every 24 weeks depending on the product you are prescribed. The product you are prescribed will be based on if you are male or male, and your condition. Make sure you understand your product and dosing. What may interact with this medicine? Do not take this medicine with any of the following medications: -chasteberry This medicine may also interact with the following medications: -herbal or dietary supplements, like black cohosh or DHEA -male hormones, like estrogens or progestins and birth control pills, patches, rings, or injections -male hormones, like testosterone This list may not describe all possible interactions. Give your health care provider a list of all the medicines, herbs, non-prescription drugs, or dietary supplements you use. Also tell them if you smoke, drink alcohol, or use illegal drugs. Some items may interact with your medicine. What should I watch for while using this medicine? Visit your doctor or health care professional for regular checks on your progress. During the first weeks of treatment, your symptoms may get worse, but then will improve as you continue your treatment. You may get hot flashes, increased bone pain, increased difficulty passing urine, or an aggravation of nerve symptoms. Discuss these effects with your doctor or health care professional, some of them may improve with continued use of this medicine. Male patients may experience a menstrual cycle or spotting during the first months of therapy with this medicine. If this continues, contact your doctor or health care professional. What side effects may I notice from receiving this medicine? Side effects that you should report to your doctor or health care professional as soon as possible: -allergic reactions like skin rash, itching or hives, swelling of the   face, lips, or tongue -breathing problems -chest pain -depression or memory disorders -pain in your legs or groin -pain at site where injected or  implanted -severe headache -swelling of the feet and legs -visual changes -vomiting Side effects that usually do not require medical attention (report to your doctor or health care professional if they continue or are bothersome): -breast swelling or tenderness -decrease in sex drive or performance -diarrhea -hot flashes -loss of appetite -muscle, joint, or bone pains -nausea -redness or irritation at site where injected or implanted -skin problems or acne This list may not describe all possible side effects. Call your doctor for medical advice about side effects. You may report side effects to FDA at 1-800-FDA-1088. Where should I keep my medicine? This drug is given in a hospital or clinic and will not be stored at home. NOTE: This sheet is a summary. It may not cover all possible information. If you have questions about this medicine, talk to your doctor, pharmacist, or health care provider.    2016, Elsevier/Gold Standard. (2013-09-17 14:16:23)  

## 2015-09-05 DIAGNOSIS — I493 Ventricular premature depolarization: Secondary | ICD-10-CM | POA: Diagnosis not present

## 2015-09-05 DIAGNOSIS — I429 Cardiomyopathy, unspecified: Secondary | ICD-10-CM | POA: Diagnosis not present

## 2015-09-05 DIAGNOSIS — I2510 Atherosclerotic heart disease of native coronary artery without angina pectoris: Secondary | ICD-10-CM | POA: Diagnosis not present

## 2015-09-26 ENCOUNTER — Telehealth: Payer: Self-pay | Admitting: Oncology

## 2015-09-26 ENCOUNTER — Other Ambulatory Visit (HOSPITAL_BASED_OUTPATIENT_CLINIC_OR_DEPARTMENT_OTHER): Payer: Commercial Managed Care - HMO

## 2015-09-26 ENCOUNTER — Ambulatory Visit (HOSPITAL_BASED_OUTPATIENT_CLINIC_OR_DEPARTMENT_OTHER): Payer: Commercial Managed Care - HMO | Admitting: Oncology

## 2015-09-26 VITALS — BP 149/87 | HR 58 | Temp 98.4°F | Resp 18 | Ht 66.0 in | Wt 148.8 lb

## 2015-09-26 DIAGNOSIS — C61 Malignant neoplasm of prostate: Secondary | ICD-10-CM | POA: Diagnosis not present

## 2015-09-26 LAB — CBC WITH DIFFERENTIAL/PLATELET
BASO%: 0.8 % (ref 0.0–2.0)
BASOS ABS: 0.1 10*3/uL (ref 0.0–0.1)
EOS%: 11.8 % — ABNORMAL HIGH (ref 0.0–7.0)
Eosinophils Absolute: 0.7 10*3/uL — ABNORMAL HIGH (ref 0.0–0.5)
HEMATOCRIT: 41.9 % (ref 38.4–49.9)
HGB: 13.8 g/dL (ref 13.0–17.1)
LYMPH#: 1.7 10*3/uL (ref 0.9–3.3)
LYMPH%: 27.2 % (ref 14.0–49.0)
MCH: 30.1 pg (ref 27.2–33.4)
MCHC: 33 g/dL (ref 32.0–36.0)
MCV: 91.3 fL (ref 79.3–98.0)
MONO#: 0.6 10*3/uL (ref 0.1–0.9)
MONO%: 9 % (ref 0.0–14.0)
NEUT#: 3.1 10*3/uL (ref 1.5–6.5)
NEUT%: 51.2 % (ref 39.0–75.0)
PLATELETS: 196 10*3/uL (ref 140–400)
RBC: 4.58 10*6/uL (ref 4.20–5.82)
RDW: 13.1 % (ref 11.0–14.6)
WBC: 6.1 10*3/uL (ref 4.0–10.3)

## 2015-09-26 LAB — COMPREHENSIVE METABOLIC PANEL
ALK PHOS: 53 U/L (ref 40–150)
ALT: 12 U/L (ref 0–55)
ANION GAP: 8 meq/L (ref 3–11)
AST: 19 U/L (ref 5–34)
Albumin: 3.7 g/dL (ref 3.5–5.0)
BUN: 22.3 mg/dL (ref 7.0–26.0)
CALCIUM: 9.2 mg/dL (ref 8.4–10.4)
CHLORIDE: 109 meq/L (ref 98–109)
CO2: 26 mEq/L (ref 22–29)
CREATININE: 1.4 mg/dL — AB (ref 0.7–1.3)
EGFR: 49 mL/min/{1.73_m2} — ABNORMAL LOW (ref >90)
Glucose: 102 mg/dl (ref 70–140)
POTASSIUM: 4.3 meq/L (ref 3.5–5.1)
Sodium: 142 mEq/L (ref 136–145)
Total Bilirubin: 0.59 mg/dL (ref 0.20–1.20)
Total Protein: 6.7 g/dL (ref 6.4–8.3)

## 2015-09-26 NOTE — Telephone Encounter (Signed)
Gave patient avs report and appointments for December  °

## 2015-09-26 NOTE — Progress Notes (Signed)
Hematology and Oncology Follow Up Visit  Jesse Mann [id number] Sep 26, 1940 75 y.o. 09/26/2015 12:35 PM Jesse Mann, MDScott, [name], MD   Principle Diagnosis: 75-year diagnosed with prostate cancer in December 2016. He had a Gleason score 5+4 = 9 and a PSA of 12.22. He developed biochemical relapse in July 2017 and PSA of 1.28.   Prior Therapy: He is status post radical prostatectomy done on 03/03/2015 utilizing a nerve sparing approach. 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 Tumor involves bilateral seminal vesicles with margins involved with cancer. 2 lymph nodes were involved for a total of 8 sampled. Seminal vesicle and vomits were also noted.  Current therapy: Lupron 30 mg injection given every 4 months first dose given on 08/08/2015.  Interim History: Jesse Mann presents today for a follow-up visit. He received Lupron injection and tolerated it very well. He does report mild hot flashes that have subsided at this time. He denied any fatigue, tiredness or weight gain. His performance status and activity level has resumed to baseline. His urine function is intact with very little to no incontinence.   He does not report any headaches, blurry vision, syncope or seizures. He does not report any fevers or chills or sweats. He does not report any cough, wheezing or hemoptysis. He does not report any nausea, vomiting or abdominal pain. He does not report any frequency urgency or hesitancy. He does report incontinence. Remaining review of systems unremarkable.  Medications: I have reviewed the patient's current medications.  Current Outpatient Prescriptions  Medication Sig Dispense Refill  . acidophilus (RISAQUAD) CAPS capsule Take 2 capsules by mouth daily. 30 capsule 0  . aspirin 81 MG tablet Take 81 mg by mouth daily.    . carvedilol (COREG) 3.125 MG tablet Take 3.125 mg by mouth 2 (two) times daily with a meal.    . HYDROcodone-acetaminophen  (NORCO) 5-325 MG tablet Take 1-2 tablets by mouth every 6 (six) hours as needed. 30 tablet 0  . Melatonin 5 MG/15ML LIQD     . nitroGLYCERIN (NITROSTAT) 0.4 MG SL tablet Place 0.4 mg under the tongue every 5 (five) minutes as needed for chest pain.     . Probiotic Product (PROBIOTIC DAILY PO) Take 1 tablet by mouth daily.     . simvastatin (ZOCOR) 10 MG tablet Take 10 mg by mouth at bedtime.      No current facility-administered medications for this visit.      Allergies: No Known Allergies  Past Medical History, Surgical history, Social history, and Family History were reviewed and updated.   Physical Exam: Blood pressure (!) 149/87, pulse (!) 58, temperature 98.4 F (36.9 C), temperature source Oral, resp. rate 18, height 5\' 6"  (1.676 m), weight 148 lb 12.8 oz (67.5 kg), SpO2 98 %.   ECOG: 0 General appearance: alert and cooperative appeared without distress. Head: Normocephalic, without obvious abnormality no oral ulcers or lesions. Neck: no adenopathy Lymph nodes: Cervical, supraclavicular, and axillary nodes normal. Heart:regular rate and rhythm, S1, S2 normal, no murmur, click, rub or gallop Lung:chest clear, no wheezing, rales, normal symmetric air entry Abdomin: soft, non-tender, without masses or organomegaly no shifting dullness or ascites. EXT:no erythema, induration, or nodules   Lab Results: Lab Results  Component Value Date   WBC 6.1 09/26/2015   HGB 13.8 09/26/2015   HCT 41.9 09/26/2015   MCV 91.3 09/26/2015   PLT 196 09/26/2015     Chemistry      Component Value  Date/Time   NA 140 03/06/2015 0242   K 4.7 03/06/2015 0242   CL 110 03/06/2015 0242   CO2 25 03/06/2015 0242   BUN 27 (H) 03/06/2015 0242   CREATININE 1.08 03/06/2015 0242      Component Value Date/Time   CALCIUM 7.8 (L) 03/06/2015 0242   ALKPHOS 26 (L) 03/06/2015 0242   AST 22 03/06/2015 0242   ALT 9 (L) 03/06/2015 0242   BILITOT 0.6 03/06/2015 0242       Impression and  Plan:   75 year old gentleman with the following issues:  1. Prostate cancer diagnosed in December 2016 with a Gleason score 4+5 = 9 and a PSA of 12. He is status post radical prostatectomy in February 2017 with His disease showed T3bN1. His PSA was never 0 and went to 0.83 and currently at 1.28 in July 2017.  He appears to have developed biochemical relapse and currently on Lupron. He received 30 mg injection on 08/08/2015 and tolerated it well. The plan is to continue with the same dose and schedule and repeat his injection after 12/08/2015. If his PSA continues to be persistently high, Zytiga will be added at that time.  2. Bone health: I recommended starting calcium and vitamin D supplements moving forward.  3. Follow-up: Will be in December 2017.   Zola Button, MD 9/19/201712:35 PM

## 2015-09-27 ENCOUNTER — Telehealth: Payer: Self-pay | Admitting: *Deleted

## 2015-09-27 LAB — PSA

## 2015-09-27 NOTE — Telephone Encounter (Signed)
-----   Message from [name], MD sent at 09/27/2015  9:03 AM EDT ----- Please let him know his PSA is very low.

## 2015-09-27 NOTE — Telephone Encounter (Signed)
Spoke with patient, PSA result given.

## 2015-10-20 DIAGNOSIS — H524 Presbyopia: Secondary | ICD-10-CM | POA: Diagnosis not present

## 2015-10-20 DIAGNOSIS — Z0100 Encounter for examination of eyes and vision without abnormal findings: Secondary | ICD-10-CM | POA: Diagnosis not present

## 2015-10-20 DIAGNOSIS — Z23 Encounter for immunization: Secondary | ICD-10-CM | POA: Diagnosis not present

## 2015-12-13 ENCOUNTER — Telehealth: Payer: Self-pay | Admitting: Oncology

## 2015-12-13 ENCOUNTER — Other Ambulatory Visit (HOSPITAL_BASED_OUTPATIENT_CLINIC_OR_DEPARTMENT_OTHER): Payer: Commercial Managed Care - HMO

## 2015-12-13 ENCOUNTER — Ambulatory Visit (HOSPITAL_BASED_OUTPATIENT_CLINIC_OR_DEPARTMENT_OTHER): Payer: Commercial Managed Care - HMO | Admitting: Oncology

## 2015-12-13 ENCOUNTER — Ambulatory Visit (HOSPITAL_BASED_OUTPATIENT_CLINIC_OR_DEPARTMENT_OTHER): Payer: Commercial Managed Care - HMO

## 2015-12-13 VITALS — BP 150/86 | HR 59 | Temp 97.5°F | Resp 16 | Wt 152.1 lb

## 2015-12-13 DIAGNOSIS — Z5111 Encounter for antineoplastic chemotherapy: Secondary | ICD-10-CM | POA: Diagnosis not present

## 2015-12-13 DIAGNOSIS — C61 Malignant neoplasm of prostate: Secondary | ICD-10-CM | POA: Diagnosis not present

## 2015-12-13 LAB — CBC WITH DIFFERENTIAL/PLATELET
BASO%: 1.1 % (ref 0.0–2.0)
BASOS ABS: 0.1 10*3/uL (ref 0.0–0.1)
EOS ABS: 0.9 10*3/uL — AB (ref 0.0–0.5)
EOS%: 13.1 % — ABNORMAL HIGH (ref 0.0–7.0)
HCT: 42.8 % (ref 38.4–49.9)
HGB: 14.1 g/dL (ref 13.0–17.1)
LYMPH%: 27.2 % (ref 14.0–49.0)
MCH: 30.3 pg (ref 27.2–33.4)
MCHC: 32.9 g/dL (ref 32.0–36.0)
MCV: 91.9 fL (ref 79.3–98.0)
MONO#: 0.8 10*3/uL (ref 0.1–0.9)
MONO%: 11.4 % (ref 0.0–14.0)
NEUT%: 47.2 % (ref 39.0–75.0)
NEUTROS ABS: 3.4 10*3/uL (ref 1.5–6.5)
Platelets: 235 10*3/uL (ref 140–400)
RBC: 4.65 10*6/uL (ref 4.20–5.82)
RDW: 13.6 % (ref 11.0–14.6)
WBC: 7.2 10*3/uL (ref 4.0–10.3)
lymph#: 2 10*3/uL (ref 0.9–3.3)

## 2015-12-13 LAB — COMPREHENSIVE METABOLIC PANEL
ALT: 20 U/L (ref 0–55)
AST: 28 U/L (ref 5–34)
Albumin: 3.9 g/dL (ref 3.5–5.0)
Alkaline Phosphatase: 65 U/L (ref 40–150)
Anion Gap: 9 mEq/L (ref 3–11)
BUN: 21 mg/dL (ref 7.0–26.0)
CHLORIDE: 106 meq/L (ref 98–109)
CO2: 28 meq/L (ref 22–29)
Calcium: 9.6 mg/dL (ref 8.4–10.4)
Creatinine: 1.2 mg/dL (ref 0.7–1.3)
EGFR: 62 mL/min/{1.73_m2} — AB (ref >90)
GLUCOSE: 81 mg/dL (ref 70–140)
POTASSIUM: 4.3 meq/L (ref 3.5–5.1)
SODIUM: 143 meq/L (ref 136–145)
Total Bilirubin: 0.61 mg/dL (ref 0.20–1.20)
Total Protein: 7 g/dL (ref 6.4–8.3)

## 2015-12-13 MED ORDER — LEUPROLIDE ACETATE (4 MONTH) 30 MG IM KIT
30.0000 mg | PACK | Freq: Once | INTRAMUSCULAR | Status: AC
Start: 2015-12-13 — End: 2015-12-13
  Administered 2015-12-13: 30 mg via INTRAMUSCULAR
  Filled 2015-12-13: qty 30

## 2015-12-13 NOTE — Patient Instructions (Signed)
Leuprolide depot injection What is this medicine? LEUPROLIDE (loo PROE lide) is a man-made protein that acts like a natural hormone in the body. It decreases testosterone in men and decreases estrogen in women. In men, this medicine is used to treat advanced prostate cancer. In women, some forms of this medicine may be used to treat endometriosis, uterine fibroids, or other male hormone-related problems. This medicine may be used for other purposes; ask your health care provider or pharmacist if you have questions. What should I tell my health care provider before I take this medicine? They need to know if you have any of these conditions: -diabetes -heart disease or previous heart attack -high blood pressure -high cholesterol -osteoporosis -pain or difficulty passing urine -spinal cord metastasis -stroke -tobacco smoker -unusual vaginal bleeding (women) -an unusual or allergic reaction to leuprolide, benzyl alcohol, other medicines, foods, dyes, or preservatives -pregnant or trying to get pregnant -breast-feeding How should I use this medicine? This medicine is for injection into a muscle or for injection under the skin. It is given by a health care professional in a hospital or clinic setting. The specific product will determine how it will be given to you. Make sure you understand which product you receive and how often you will receive it. Talk to your pediatrician regarding the use of this medicine in children. Special care may be needed. Overdosage: If you think you have taken too much of this medicine contact a poison control center or emergency room at once. NOTE: This medicine is only for you. Do not share this medicine with others. What if I miss a dose? It is important not to miss a dose. Call your doctor or health care professional if you are unable to keep an appointment. Depot injections: Depot injections are given either once-monthly, every 12 weeks, every 16 weeks, or  every 24 weeks depending on the product you are prescribed. The product you are prescribed will be based on if you are male or male, and your condition. Make sure you understand your product and dosing. What may interact with this medicine? Do not take this medicine with any of the following medications: -chasteberry This medicine may also interact with the following medications: -herbal or dietary supplements, like black cohosh or DHEA -male hormones, like estrogens or progestins and birth control pills, patches, rings, or injections -male hormones, like testosterone This list may not describe all possible interactions. Give your health care provider a list of all the medicines, herbs, non-prescription drugs, or dietary supplements you use. Also tell them if you smoke, drink alcohol, or use illegal drugs. Some items may interact with your medicine. What should I watch for while using this medicine? Visit your doctor or health care professional for regular checks on your progress. During the first weeks of treatment, your symptoms may get worse, but then will improve as you continue your treatment. You may get hot flashes, increased bone pain, increased difficulty passing urine, or an aggravation of nerve symptoms. Discuss these effects with your doctor or health care professional, some of them may improve with continued use of this medicine. Male patients may experience a menstrual cycle or spotting during the first months of therapy with this medicine. If this continues, contact your doctor or health care professional. What side effects may I notice from receiving this medicine? Side effects that you should report to your doctor or health care professional as soon as possible: -allergic reactions like skin rash, itching or hives, swelling of the   face, lips, or tongue -breathing problems -chest pain -depression or memory disorders -pain in your legs or groin -pain at site where injected or  implanted -severe headache -swelling of the feet and legs -visual changes -vomiting Side effects that usually do not require medical attention (report to your doctor or health care professional if they continue or are bothersome): -breast swelling or tenderness -decrease in sex drive or performance -diarrhea -hot flashes -loss of appetite -muscle, joint, or bone pains -nausea -redness or irritation at site where injected or implanted -skin problems or acne This list may not describe all possible side effects. Call your doctor for medical advice about side effects. You may report side effects to FDA at 1-800-FDA-1088. Where should I keep my medicine? This drug is given in a hospital or clinic and will not be stored at home. NOTE: This sheet is a summary. It may not cover all possible information. If you have questions about this medicine, talk to your doctor, pharmacist, or health care provider.    2016, Elsevier/Gold Standard. (2013-09-17 14:16:23)  

## 2015-12-13 NOTE — Progress Notes (Signed)
Hematology and Oncology Follow Up Visit  NEZAR LANGER [id number] 09-04-1940 75 y.o. 12/13/2015 12:31 PM [name], MDScott, [name], MD   Principle Diagnosis: 75-year diagnosed with prostate cancer in December 2016. He had a Gleason score 5+4 = 9 and a PSA of 12.22. He developed biochemical relapse in July 2017 and PSA of 1.28.   Prior Therapy: He is status post radical prostatectomy done on 03/03/2015 utilizing a nerve sparing approach. 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 Tumor involves bilateral seminal vesicles with margins involved with cancer. 2 lymph nodes were involved for a total of 8 sampled. Seminal vesicle and vomits were also noted.  Current therapy: Lupron 30 mg injection given every 4 months first dose given on 08/08/2015.  Interim History: Mr. Stoneburner presents today for a follow-up visit. Since the last visit, he reports no major changes in his health. He does report hot flashes at times but have been manageable.  He denied any fatigue, tiredness or weight gain. His performance status and activity level has resumed to baseline. His urine function is intact with very little to no incontinence. He does not report any worsening back pain or pathological fractures. He does report chronic arthritis which is not dramatically changed.   He does not report any headaches, blurry vision, syncope or seizures. He does not report any fevers or chills or sweats. He does not report any cough, wheezing or hemoptysis. He does not report any nausea, vomiting or abdominal pain. He does not report any frequency urgency or hesitancy. He does report incontinence. Remaining review of systems unremarkable.  Medications: I have reviewed the patient's current medications.  Current Outpatient Prescriptions  Medication Sig Dispense Refill  . acidophilus (RISAQUAD) CAPS capsule Take 2 capsules by mouth daily. 30 capsule 0  . aspirin 81 MG tablet Take 81 mg by  mouth daily.    . carvedilol (COREG) 3.125 MG tablet Take 3.125 mg by mouth 2 (two) times daily with a meal.    . nitroGLYCERIN (NITROSTAT) 0.4 MG SL tablet Place 0.4 mg under the tongue every 5 (five) minutes as needed for chest pain.     . Probiotic Product (PROBIOTIC DAILY PO) Take 1 tablet by mouth daily.     . simvastatin (ZOCOR) 10 MG tablet Take 10 mg by mouth at bedtime.      No current facility-administered medications for this visit.      Allergies: No Known Allergies  Past Medical History, Surgical history, Social history, and Family History were reviewed and updated.   Physical Exam: Blood pressure (!) 150/86, pulse (!) 59, temperature 97.5 F (36.4 C), temperature source Oral, resp. rate 16, weight 152 lb 2 oz (69 kg), SpO2 100 %.   ECOG: 0 General appearance: Well-appearing gentleman without distress. Head: Normocephalic, without obvious abnormality no oral thrush noted. Neck: no adenopathy Lymph nodes: Cervical, supraclavicular, and axillary nodes normal. Heart:regular rate and rhythm, S1, S2 normal, no murmur, click, rub or gallop Lung:chest clear, no wheezing, rales, normal symmetric air entry Abdomin: soft, non-tender, without masses or organomegaly no rebound or guarding. EXT:no erythema, induration, or nodules   Lab Results: Lab Results  Component Value Date   WBC 7.2 12/13/2015   HGB 14.1 12/13/2015   HCT 42.8 12/13/2015   MCV 91.9 12/13/2015   PLT 235 12/13/2015     Chemistry      Component Value Date/Time   NA 142 09/26/2015 1210   K 4.3 09/26/2015 1210  CL 110 03/06/2015 0242   CO2 26 09/26/2015 1210   BUN 22.3 09/26/2015 1210   CREATININE 1.4 (H) 09/26/2015 1210      Component Value Date/Time   CALCIUM 9.2 09/26/2015 1210   ALKPHOS 53 09/26/2015 1210   AST 19 09/26/2015 1210   ALT 12 09/26/2015 1210   BILITOT 0.59 09/26/2015 1210      Results for GEORDAN, BRASHERS (MRN [id number]) as of 12/13/2015 12:21  Ref. Range 09/26/2015 12:10   PSA Latest Ref Range: 0.0 - 4.0 ng/mL <0.1   Impression and Plan:   75 year old gentleman with the following issues:  1. Prostate cancer diagnosed in December 2016 with a Gleason score 4+5 = 9 and a PSA of 12. He is status post radical prostatectomy in February 2017 with His disease showed T3bN1. His PSA was never 0 and went to 0.83 and to 1.28 in July 2017.  He developed biochemical relapse and currently on Lupron. He received 30 mg injection on 08/08/2015 and tolerated it well. His PSA and September 2017 was undetectable indicating excellent response. The plan is to continue with Lupron every 4 months and monitor his PSA periodically.  2. Bone health: I recommended starting calcium and vitamin D supplements moving forward.  3. Follow-up: Will be in April 2018.   [id number], MD 12/6/201712:31 PM

## 2015-12-13 NOTE — Telephone Encounter (Signed)
Appointments scheduled per 12/13/15 los. A copy of the AVS report and appointment schedule was given to the patient, per 12/13/15 los. °

## 2015-12-14 ENCOUNTER — Telehealth: Payer: Self-pay | Admitting: *Deleted

## 2015-12-14 LAB — PSA

## 2015-12-14 NOTE — Telephone Encounter (Signed)
Spoke with patient, gave results of last PSA 

## 2015-12-14 NOTE — Telephone Encounter (Signed)
-----   Message from [name], MD sent at 12/14/2015  8:36 AM EST ----- Please let him know his PSA still very low.

## 2016-02-02 DIAGNOSIS — Z800 Family history of malignant neoplasm of digestive organs: Secondary | ICD-10-CM | POA: Diagnosis not present

## 2016-02-02 DIAGNOSIS — Z1211 Encounter for screening for malignant neoplasm of colon: Secondary | ICD-10-CM | POA: Diagnosis not present

## 2016-02-02 DIAGNOSIS — K5730 Diverticulosis of large intestine without perforation or abscess without bleeding: Secondary | ICD-10-CM | POA: Diagnosis not present

## 2016-02-02 DIAGNOSIS — D125 Benign neoplasm of sigmoid colon: Secondary | ICD-10-CM | POA: Diagnosis not present

## 2016-02-02 DIAGNOSIS — K635 Polyp of colon: Secondary | ICD-10-CM | POA: Diagnosis not present

## 2016-02-29 DIAGNOSIS — I255 Ischemic cardiomyopathy: Secondary | ICD-10-CM | POA: Diagnosis not present

## 2016-02-29 DIAGNOSIS — I493 Ventricular premature depolarization: Secondary | ICD-10-CM | POA: Diagnosis not present

## 2016-02-29 DIAGNOSIS — I2510 Atherosclerotic heart disease of native coronary artery without angina pectoris: Secondary | ICD-10-CM | POA: Diagnosis not present

## 2016-03-04 DIAGNOSIS — E876 Hypokalemia: Secondary | ICD-10-CM | POA: Diagnosis not present

## 2016-03-06 DIAGNOSIS — E876 Hypokalemia: Secondary | ICD-10-CM | POA: Diagnosis not present

## 2016-03-13 DIAGNOSIS — I499 Cardiac arrhythmia, unspecified: Secondary | ICD-10-CM | POA: Diagnosis not present

## 2016-03-14 DIAGNOSIS — I493 Ventricular premature depolarization: Secondary | ICD-10-CM | POA: Diagnosis not present

## 2016-03-14 DIAGNOSIS — I2510 Atherosclerotic heart disease of native coronary artery without angina pectoris: Secondary | ICD-10-CM | POA: Diagnosis not present

## 2016-03-14 DIAGNOSIS — I255 Ischemic cardiomyopathy: Secondary | ICD-10-CM | POA: Diagnosis not present

## 2016-03-19 DIAGNOSIS — I255 Ischemic cardiomyopathy: Secondary | ICD-10-CM | POA: Diagnosis not present

## 2016-03-19 DIAGNOSIS — I493 Ventricular premature depolarization: Secondary | ICD-10-CM | POA: Diagnosis not present

## 2016-03-19 DIAGNOSIS — I2510 Atherosclerotic heart disease of native coronary artery without angina pectoris: Secondary | ICD-10-CM | POA: Diagnosis not present

## 2016-03-20 DIAGNOSIS — I2510 Atherosclerotic heart disease of native coronary artery without angina pectoris: Secondary | ICD-10-CM | POA: Diagnosis not present

## 2016-03-20 DIAGNOSIS — I493 Ventricular premature depolarization: Secondary | ICD-10-CM | POA: Diagnosis not present

## 2016-03-20 DIAGNOSIS — I255 Ischemic cardiomyopathy: Secondary | ICD-10-CM | POA: Diagnosis not present

## 2016-04-09 DIAGNOSIS — I493 Ventricular premature depolarization: Secondary | ICD-10-CM | POA: Diagnosis not present

## 2016-04-09 DIAGNOSIS — I2510 Atherosclerotic heart disease of native coronary artery without angina pectoris: Secondary | ICD-10-CM | POA: Diagnosis not present

## 2016-04-09 DIAGNOSIS — I255 Ischemic cardiomyopathy: Secondary | ICD-10-CM | POA: Diagnosis not present

## 2016-04-09 DIAGNOSIS — C61 Malignant neoplasm of prostate: Secondary | ICD-10-CM | POA: Diagnosis not present

## 2016-04-10 ENCOUNTER — Ambulatory Visit (HOSPITAL_BASED_OUTPATIENT_CLINIC_OR_DEPARTMENT_OTHER): Payer: Medicare HMO

## 2016-04-10 ENCOUNTER — Telehealth: Payer: Self-pay | Admitting: Oncology

## 2016-04-10 ENCOUNTER — Other Ambulatory Visit (HOSPITAL_BASED_OUTPATIENT_CLINIC_OR_DEPARTMENT_OTHER): Payer: Medicare HMO

## 2016-04-10 ENCOUNTER — Ambulatory Visit (HOSPITAL_BASED_OUTPATIENT_CLINIC_OR_DEPARTMENT_OTHER): Payer: Medicare HMO | Admitting: Oncology

## 2016-04-10 VITALS — BP 139/84 | HR 97 | Temp 99.1°F | Resp 18 | Ht 66.0 in | Wt 154.3 lb

## 2016-04-10 DIAGNOSIS — C61 Malignant neoplasm of prostate: Secondary | ICD-10-CM

## 2016-04-10 DIAGNOSIS — Z5111 Encounter for antineoplastic chemotherapy: Secondary | ICD-10-CM

## 2016-04-10 DIAGNOSIS — E875 Hyperkalemia: Secondary | ICD-10-CM

## 2016-04-10 LAB — CBC WITH DIFFERENTIAL/PLATELET
BASO%: 1.3 % (ref 0.0–2.0)
Basophils Absolute: 0.1 10*3/uL (ref 0.0–0.1)
EOS ABS: 0.7 10*3/uL — AB (ref 0.0–0.5)
EOS%: 13.7 % — ABNORMAL HIGH (ref 0.0–7.0)
HEMATOCRIT: 41.2 % (ref 38.4–49.9)
HGB: 14 g/dL (ref 13.0–17.1)
LYMPH%: 25.9 % (ref 14.0–49.0)
MCH: 30.6 pg (ref 27.2–33.4)
MCHC: 34 g/dL (ref 32.0–36.0)
MCV: 90 fL (ref 79.3–98.0)
MONO#: 0.5 10*3/uL (ref 0.1–0.9)
MONO%: 9.7 % (ref 0.0–14.0)
NEUT%: 49.4 % (ref 39.0–75.0)
NEUTROS ABS: 2.6 10*3/uL (ref 1.5–6.5)
Platelets: 242 10*3/uL (ref 140–400)
RBC: 4.58 10*6/uL (ref 4.20–5.82)
RDW: 13.1 % (ref 11.0–14.6)
WBC: 5.2 10*3/uL (ref 4.0–10.3)
lymph#: 1.3 10*3/uL (ref 0.9–3.3)

## 2016-04-10 LAB — COMPREHENSIVE METABOLIC PANEL
ALBUMIN: 3.9 g/dL (ref 3.5–5.0)
ALK PHOS: 57 U/L (ref 40–150)
ALT: 15 U/L (ref 0–55)
AST: 23 U/L (ref 5–34)
Anion Gap: 8 mEq/L (ref 3–11)
BILIRUBIN TOTAL: 0.79 mg/dL (ref 0.20–1.20)
BUN: 19.8 mg/dL (ref 7.0–26.0)
CALCIUM: 9.6 mg/dL (ref 8.4–10.4)
CO2: 26 meq/L (ref 22–29)
CREATININE: 1.2 mg/dL (ref 0.7–1.3)
Chloride: 108 mEq/L (ref 98–109)
EGFR: 59 mL/min/{1.73_m2} — AB (ref >90)
Glucose: 98 mg/dl (ref 70–140)
Potassium: 4.1 mEq/L (ref 3.5–5.1)
Sodium: 142 mEq/L (ref 136–145)
TOTAL PROTEIN: 6.9 g/dL (ref 6.4–8.3)

## 2016-04-10 MED ORDER — LEUPROLIDE ACETATE (4 MONTH) 30 MG IM KIT
30.0000 mg | PACK | Freq: Once | INTRAMUSCULAR | Status: AC
  Administered 2016-04-10: 30 mg via INTRAMUSCULAR
  Filled 2016-04-10: qty 30

## 2016-04-10 NOTE — Patient Instructions (Signed)
Leuprolide depot injection What is this medicine? LEUPROLIDE (loo PROE lide) is a man-made protein that acts like a natural hormone in the body. It decreases testosterone in men and decreases estrogen in women. In men, this medicine is used to treat advanced prostate cancer. In women, some forms of this medicine may be used to treat endometriosis, uterine fibroids, or other male hormone-related problems. This medicine may be used for other purposes; ask your health care provider or pharmacist if you have questions. What should I tell my health care provider before I take this medicine? They need to know if you have any of these conditions: -diabetes -heart disease or previous heart attack -high blood pressure -high cholesterol -osteoporosis -pain or difficulty passing urine -spinal cord metastasis -stroke -tobacco smoker -unusual vaginal bleeding (women) -an unusual or allergic reaction to leuprolide, benzyl alcohol, other medicines, foods, dyes, or preservatives -pregnant or trying to get pregnant -breast-feeding How should I use this medicine? This medicine is for injection into a muscle or for injection under the skin. It is given by a health care professional in a hospital or clinic setting. The specific product will determine how it will be given to you. Make sure you understand which product you receive and how often you will receive it. Talk to your pediatrician regarding the use of this medicine in children. Special care may be needed. Overdosage: If you think you have taken too much of this medicine contact a poison control center or emergency room at once. NOTE: This medicine is only for you. Do not share this medicine with others. What if I miss a dose? It is important not to miss a dose. Call your doctor or health care professional if you are unable to keep an appointment. Depot injections: Depot injections are given either once-monthly, every 12 weeks, every 16 weeks, or  every 24 weeks depending on the product you are prescribed. The product you are prescribed will be based on if you are male or male, and your condition. Make sure you understand your product and dosing. What may interact with this medicine? Do not take this medicine with any of the following medications: -chasteberry This medicine may also interact with the following medications: -herbal or dietary supplements, like black cohosh or DHEA -male hormones, like estrogens or progestins and birth control pills, patches, rings, or injections -male hormones, like testosterone This list may not describe all possible interactions. Give your health care provider a list of all the medicines, herbs, non-prescription drugs, or dietary supplements you use. Also tell them if you smoke, drink alcohol, or use illegal drugs. Some items may interact with your medicine. What should I watch for while using this medicine? Visit your doctor or health care professional for regular checks on your progress. During the first weeks of treatment, your symptoms may get worse, but then will improve as you continue your treatment. You may get hot flashes, increased bone pain, increased difficulty passing urine, or an aggravation of nerve symptoms. Discuss these effects with your doctor or health care professional, some of them may improve with continued use of this medicine. Male patients may experience a menstrual cycle or spotting during the first months of therapy with this medicine. If this continues, contact your doctor or health care professional. What side effects may I notice from receiving this medicine? Side effects that you should report to your doctor or health care professional as soon as possible: -allergic reactions like skin rash, itching or hives, swelling of the   face, lips, or tongue -breathing problems -chest pain -depression or memory disorders -pain in your legs or groin -pain at site where injected or  implanted -severe headache -swelling of the feet and legs -visual changes -vomiting Side effects that usually do not require medical attention (report to your doctor or health care professional if they continue or are bothersome): -breast swelling or tenderness -decrease in sex drive or performance -diarrhea -hot flashes -loss of appetite -muscle, joint, or bone pains -nausea -redness or irritation at site where injected or implanted -skin problems or acne This list may not describe all possible side effects. Call your doctor for medical advice about side effects. You may report side effects to FDA at 1-800-FDA-1088. Where should I keep my medicine? This drug is given in a hospital or clinic and will not be stored at home. NOTE: This sheet is a summary. It may not cover all possible information. If you have questions about this medicine, talk to your doctor, pharmacist, or health care provider.    2016, Elsevier/Gold Standard. (2013-09-17 14:16:23)  

## 2016-04-10 NOTE — Telephone Encounter (Signed)
Appointments scheduled per 4.4.18 LOS. Patient given AVS report and calendars with future scheduled appointments. °

## 2016-04-10 NOTE — Progress Notes (Signed)
Hematology and Oncology Follow Up Visit  Jesse Mann [id number] [birth year] 76 y.o. 04/10/2016 10:25 AM [name], MDScott, [name], MD   Principle Diagnosis: 75-year diagnosed with prostate cancer in December 2016. He had a Gleason score 5+4 = 9 and a PSA of 12.22. He developed biochemical relapse in July 2017 and PSA of 1.28.   Prior Therapy: He is status post radical prostatectomy done on 03/03/2015 utilizing a nerve sparing approach. 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 Tumor involves bilateral seminal vesicles with margins involved with cancer. 2 lymph nodes were involved for a total of 8 sampled. Seminal vesicle and vomits were also noted.  Current therapy: Lupron 30 mg injection given every 4 months first dose given on 08/08/2015.  Interim History: Jesse Mann presents today for a follow-up visit. Since the last visit, he reports doing well without any recent complaints. He was found to have elevated potassium on routine laboratory testing by his cardiologist. The etiology remained unclear and he had to take medication to lower it which I presume it was Kayexalate. His most recent potassium was 4.7. He is decreasing his intake of potassium-rich diet.  He tolerates Lupron without any major complications. He denied any pathological fractures or bone pain. He does report some hot flashes but otherwise no recent complaints.   He does not report any headaches, blurry vision, syncope or seizures. He does not report any fevers or chills or sweats. He does not report any cough, wheezing or hemoptysis. He does not report any nausea, vomiting or abdominal pain. He does not report any frequency urgency or hesitancy. He does report incontinence. Remaining review of systems unremarkable.  Medications: I have reviewed the patient's current medications.  Current Outpatient Prescriptions  Medication Sig Dispense Refill  . acidophilus (RISAQUAD) CAPS  capsule Take 2 capsules by mouth daily. 30 capsule 0  . aspirin 81 MG tablet Take 81 mg by mouth daily.    . carvedilol (COREG) 3.125 MG tablet Take 3.125 mg by mouth 2 (two) times daily with a meal.    . nitroGLYCERIN (NITROSTAT) 0.4 MG SL tablet Place 0.4 mg under the tongue every 5 (five) minutes as needed for chest pain.     . Probiotic Product (PROBIOTIC DAILY PO) Take 1 tablet by mouth daily.     . simvastatin (ZOCOR) 10 MG tablet Take 10 mg by mouth at bedtime.      No current facility-administered medications for this visit.      Allergies: No Known Allergies  Past Medical History, Surgical history, Social history, and Family History were reviewed and updated.   Physical Exam: Blood pressure 139/84, pulse 97, temperature 99.1 F (37.3 C), temperature source Oral, resp. rate 18, height 5\' 6"  (1.676 m), weight 154 lb 4.8 oz (70 kg), SpO2 97 %.   ECOG: 0 General appearance: Alert, awake gentleman without distress. Head: Normocephalic, without obvious abnormality no oral thrush noted. Neck: no adenopathy Lymph nodes: Cervical, supraclavicular, and axillary nodes normal. Heart:regular rate and rhythm, S1, S2 normal, no murmur, click, rub or gallop Lung:chest clear, no wheezing, rales, normal symmetric air entry Abdomin: soft, non-tender, without masses or organomegaly no shifting dullness or ascites. EXT:no erythema, induration, or nodules   Lab Results: Lab Results  Component Value Date   WBC 5.2 04/10/2016   HGB 14.0 04/10/2016   HCT 41.2 04/10/2016   MCV 90.0 04/10/2016   PLT 242 04/10/2016     Chemistry  Component Value Date/Time   NA 143 12/13/2015 1206   K 4.3 12/13/2015 1206   CL 110 03/06/2015 0242   CO2 28 12/13/2015 1206   BUN 21.0 12/13/2015 1206   CREATININE 1.2 12/13/2015 1206      Component Value Date/Time   CALCIUM 9.6 12/13/2015 1206   ALKPHOS 65 12/13/2015 1206   AST 28 12/13/2015 1206   ALT 20 12/13/2015 1206   BILITOT 0.61 12/13/2015  1206        Impression and Plan:   77 year old gentleman with the following issues:  1. Prostate cancer diagnosed in December 2016 with a Gleason score 4+5 = 9 and a PSA of 12. He is status post radical prostatectomy in February 2017 with His disease showed T3bN1. His PSA was never 0 and went to 0.83 and to 1.28 in July 2017.  He developed biochemical relapse and currently on Lupron. He received 30 mg injection on 08/08/2015 and tolerated it well.   PSA continues to be undetectable at this time and the plan is to continue with Lupron every 4 months. Different salvage therapy will be used PSA starts to rise again.  2. Bone health: I recommended starting calcium and vitamin D supplements moving forward.  3. Hyperkalemia: Etiology is unclear and unrelated to his prostate cancer and treatment. Potassium was repeated today.  4. Follow-up: Will be in August 2018.   [facility], MD 4/4/201810:25 AM

## 2016-04-11 ENCOUNTER — Telehealth: Payer: Self-pay | Admitting: *Deleted

## 2016-04-11 LAB — TESTOSTERONE: Testosterone, Serum: 3 ng/dL — ABNORMAL LOW (ref 264–916)

## 2016-04-11 LAB — PSA: Prostate Specific Ag, Serum: <0.1 ng/mL (ref 0.0–4.0)

## 2016-04-11 NOTE — Telephone Encounter (Signed)
As noted below by Dr. Shadad, I informed patient of his PSA level. Patient verbalized understanding. 

## 2016-04-11 NOTE — Telephone Encounter (Signed)
-----   Message from [name], MD sent at 04/11/2016  9:15 AM EDT ----- Please let him know his PSA

## 2016-04-23 DIAGNOSIS — H25813 Combined forms of age-related cataract, bilateral: Secondary | ICD-10-CM | POA: Diagnosis not present

## 2016-05-24 DIAGNOSIS — Z0000 Encounter for general adult medical examination without abnormal findings: Secondary | ICD-10-CM | POA: Diagnosis not present

## 2016-05-24 DIAGNOSIS — Z8546 Personal history of malignant neoplasm of prostate: Secondary | ICD-10-CM | POA: Diagnosis not present

## 2016-05-24 DIAGNOSIS — E875 Hyperkalemia: Secondary | ICD-10-CM | POA: Diagnosis not present

## 2016-05-24 DIAGNOSIS — I255 Ischemic cardiomyopathy: Secondary | ICD-10-CM | POA: Diagnosis not present

## 2016-05-24 DIAGNOSIS — M1712 Unilateral primary osteoarthritis, left knee: Secondary | ICD-10-CM | POA: Diagnosis not present

## 2016-05-24 DIAGNOSIS — Z9181 History of falling: Secondary | ICD-10-CM | POA: Diagnosis not present

## 2016-05-24 DIAGNOSIS — Z1389 Encounter for screening for other disorder: Secondary | ICD-10-CM | POA: Diagnosis not present

## 2016-05-24 DIAGNOSIS — Z79899 Other long term (current) drug therapy: Secondary | ICD-10-CM | POA: Diagnosis not present

## 2016-05-24 DIAGNOSIS — I420 Dilated cardiomyopathy: Secondary | ICD-10-CM | POA: Diagnosis not present

## 2016-07-22 DIAGNOSIS — R197 Diarrhea, unspecified: Secondary | ICD-10-CM | POA: Diagnosis not present

## 2016-07-22 DIAGNOSIS — R402433 Glasgow coma scale score 3-8, at hospital admission: Secondary | ICD-10-CM | POA: Diagnosis not present

## 2016-07-22 DIAGNOSIS — E784 Other hyperlipidemia: Secondary | ICD-10-CM | POA: Diagnosis not present

## 2016-07-22 DIAGNOSIS — I469 Cardiac arrest, cause unspecified: Secondary | ICD-10-CM | POA: Diagnosis not present

## 2016-07-22 DIAGNOSIS — J930 Spontaneous tension pneumothorax: Secondary | ICD-10-CM | POA: Diagnosis not present

## 2016-07-22 DIAGNOSIS — R601 Generalized edema: Secondary | ICD-10-CM | POA: Diagnosis not present

## 2016-07-22 DIAGNOSIS — Z7409 Other reduced mobility: Secondary | ICD-10-CM | POA: Diagnosis not present

## 2016-07-22 DIAGNOSIS — E876 Hypokalemia: Secondary | ICD-10-CM | POA: Diagnosis not present

## 2016-07-22 DIAGNOSIS — A0472 Enterocolitis due to Clostridium difficile, not specified as recurrent: Secondary | ICD-10-CM | POA: Diagnosis not present

## 2016-07-22 DIAGNOSIS — G931 Anoxic brain damage, not elsewhere classified: Secondary | ICD-10-CM | POA: Diagnosis not present

## 2016-07-22 DIAGNOSIS — J690 Pneumonitis due to inhalation of food and vomit: Secondary | ICD-10-CM | POA: Diagnosis not present

## 2016-07-22 DIAGNOSIS — E875 Hyperkalemia: Secondary | ICD-10-CM | POA: Diagnosis not present

## 2016-07-22 DIAGNOSIS — R001 Bradycardia, unspecified: Secondary | ICD-10-CM | POA: Diagnosis not present

## 2016-07-22 DIAGNOSIS — Z9911 Dependence on respirator [ventilator] status: Secondary | ICD-10-CM | POA: Diagnosis not present

## 2016-07-22 DIAGNOSIS — J9690 Respiratory failure, unspecified, unspecified whether with hypoxia or hypercapnia: Secondary | ICD-10-CM | POA: Diagnosis not present

## 2016-07-22 DIAGNOSIS — N170 Acute kidney failure with tubular necrosis: Secondary | ICD-10-CM | POA: Diagnosis not present

## 2016-07-22 DIAGNOSIS — Z4682 Encounter for fitting and adjustment of non-vascular catheter: Secondary | ICD-10-CM | POA: Diagnosis not present

## 2016-07-22 DIAGNOSIS — Z955 Presence of coronary angioplasty implant and graft: Secondary | ICD-10-CM | POA: Diagnosis not present

## 2016-07-22 DIAGNOSIS — J449 Chronic obstructive pulmonary disease, unspecified: Secondary | ICD-10-CM | POA: Diagnosis not present

## 2016-07-22 DIAGNOSIS — J9600 Acute respiratory failure, unspecified whether with hypoxia or hypercapnia: Secondary | ICD-10-CM | POA: Diagnosis not present

## 2016-07-22 DIAGNOSIS — R130 Aphagia: Secondary | ICD-10-CM | POA: Diagnosis not present

## 2016-07-22 DIAGNOSIS — I959 Hypotension, unspecified: Secondary | ICD-10-CM | POA: Diagnosis not present

## 2016-07-22 DIAGNOSIS — J90 Pleural effusion, not elsewhere classified: Secondary | ICD-10-CM | POA: Diagnosis not present

## 2016-07-22 DIAGNOSIS — D72829 Elevated white blood cell count, unspecified: Secondary | ICD-10-CM | POA: Diagnosis not present

## 2016-07-22 DIAGNOSIS — I4901 Ventricular fibrillation: Secondary | ICD-10-CM | POA: Diagnosis not present

## 2016-07-22 DIAGNOSIS — E46 Unspecified protein-calorie malnutrition: Secondary | ICD-10-CM | POA: Diagnosis not present

## 2016-07-22 DIAGNOSIS — J9620 Acute and chronic respiratory failure, unspecified whether with hypoxia or hypercapnia: Secondary | ICD-10-CM | POA: Diagnosis not present

## 2016-07-22 DIAGNOSIS — D649 Anemia, unspecified: Secondary | ICD-10-CM | POA: Diagnosis not present

## 2016-07-22 DIAGNOSIS — Z930 Tracheostomy status: Secondary | ICD-10-CM | POA: Diagnosis not present

## 2016-07-22 DIAGNOSIS — G9349 Other encephalopathy: Secondary | ICD-10-CM | POA: Diagnosis not present

## 2016-07-22 DIAGNOSIS — I5022 Chronic systolic (congestive) heart failure: Secondary | ICD-10-CM | POA: Diagnosis not present

## 2016-07-22 DIAGNOSIS — E870 Hyperosmolality and hypernatremia: Secondary | ICD-10-CM | POA: Diagnosis not present

## 2016-07-22 DIAGNOSIS — J189 Pneumonia, unspecified organism: Secondary | ICD-10-CM | POA: Diagnosis not present

## 2016-07-22 DIAGNOSIS — N2889 Other specified disorders of kidney and ureter: Secondary | ICD-10-CM | POA: Diagnosis not present

## 2016-07-22 DIAGNOSIS — N182 Chronic kidney disease, stage 2 (mild): Secondary | ICD-10-CM | POA: Diagnosis not present

## 2016-07-22 DIAGNOSIS — I471 Supraventricular tachycardia: Secondary | ICD-10-CM | POA: Diagnosis not present

## 2016-07-22 DIAGNOSIS — I259 Chronic ischemic heart disease, unspecified: Secondary | ICD-10-CM | POA: Diagnosis not present

## 2016-07-22 DIAGNOSIS — I468 Cardiac arrest due to other underlying condition: Secondary | ICD-10-CM | POA: Diagnosis not present

## 2016-07-22 DIAGNOSIS — S2243XA Multiple fractures of ribs, bilateral, initial encounter for closed fracture: Secondary | ICD-10-CM | POA: Diagnosis not present

## 2016-07-22 DIAGNOSIS — G9340 Encephalopathy, unspecified: Secondary | ICD-10-CM | POA: Diagnosis not present

## 2016-07-22 DIAGNOSIS — I472 Ventricular tachycardia: Secondary | ICD-10-CM | POA: Diagnosis not present

## 2016-07-22 DIAGNOSIS — S2242XA Multiple fractures of ribs, left side, initial encounter for closed fracture: Secondary | ICD-10-CM | POA: Diagnosis not present

## 2016-07-22 DIAGNOSIS — I252 Old myocardial infarction: Secondary | ICD-10-CM | POA: Diagnosis not present

## 2016-07-22 DIAGNOSIS — G4700 Insomnia, unspecified: Secondary | ICD-10-CM | POA: Diagnosis not present

## 2016-07-22 DIAGNOSIS — R710 Precipitous drop in hematocrit: Secondary | ICD-10-CM | POA: Diagnosis not present

## 2016-07-22 DIAGNOSIS — J810 Acute pulmonary edema: Secondary | ICD-10-CM | POA: Diagnosis not present

## 2016-07-22 DIAGNOSIS — I255 Ischemic cardiomyopathy: Secondary | ICD-10-CM | POA: Diagnosis not present

## 2016-07-22 DIAGNOSIS — E872 Acidosis: Secondary | ICD-10-CM | POA: Diagnosis not present

## 2016-07-22 DIAGNOSIS — S299XXA Unspecified injury of thorax, initial encounter: Secondary | ICD-10-CM | POA: Diagnosis not present

## 2016-07-22 DIAGNOSIS — I454 Nonspecific intraventricular block: Secondary | ICD-10-CM | POA: Diagnosis not present

## 2016-07-22 DIAGNOSIS — S2239XA Fracture of one rib, unspecified side, initial encounter for closed fracture: Secondary | ICD-10-CM | POA: Diagnosis not present

## 2016-07-22 DIAGNOSIS — G9341 Metabolic encephalopathy: Secondary | ICD-10-CM | POA: Diagnosis not present

## 2016-07-22 DIAGNOSIS — I493 Ventricular premature depolarization: Secondary | ICD-10-CM | POA: Diagnosis not present

## 2016-07-22 DIAGNOSIS — S2241XA Multiple fractures of ribs, right side, initial encounter for closed fracture: Secondary | ICD-10-CM | POA: Diagnosis not present

## 2016-07-22 DIAGNOSIS — I4891 Unspecified atrial fibrillation: Secondary | ICD-10-CM | POA: Diagnosis not present

## 2016-07-22 DIAGNOSIS — I25119 Atherosclerotic heart disease of native coronary artery with unspecified angina pectoris: Secondary | ICD-10-CM | POA: Diagnosis not present

## 2016-07-22 DIAGNOSIS — S2220XA Unspecified fracture of sternum, initial encounter for closed fracture: Secondary | ICD-10-CM | POA: Diagnosis not present

## 2016-07-22 DIAGNOSIS — R05 Cough: Secondary | ICD-10-CM | POA: Diagnosis not present

## 2016-07-22 DIAGNOSIS — I429 Cardiomyopathy, unspecified: Secondary | ICD-10-CM | POA: Diagnosis not present

## 2016-07-22 DIAGNOSIS — S225XXD Flail chest, subsequent encounter for fracture with routine healing: Secondary | ICD-10-CM | POA: Diagnosis not present

## 2016-07-22 DIAGNOSIS — G8918 Other acute postprocedural pain: Secondary | ICD-10-CM | POA: Diagnosis not present

## 2016-07-22 DIAGNOSIS — I5020 Unspecified systolic (congestive) heart failure: Secondary | ICD-10-CM | POA: Diagnosis not present

## 2016-07-22 DIAGNOSIS — R1310 Dysphagia, unspecified: Secondary | ICD-10-CM | POA: Diagnosis not present

## 2016-07-22 DIAGNOSIS — R918 Other nonspecific abnormal finding of lung field: Secondary | ICD-10-CM | POA: Diagnosis not present

## 2016-07-22 DIAGNOSIS — G939 Disorder of brain, unspecified: Secondary | ICD-10-CM | POA: Diagnosis not present

## 2016-07-22 DIAGNOSIS — N183 Chronic kidney disease, stage 3 (moderate): Secondary | ICD-10-CM | POA: Diagnosis not present

## 2016-07-22 DIAGNOSIS — I2510 Atherosclerotic heart disease of native coronary artery without angina pectoris: Secondary | ICD-10-CM | POA: Diagnosis not present

## 2016-07-22 DIAGNOSIS — J9383 Other pneumothorax: Secondary | ICD-10-CM | POA: Diagnosis not present

## 2016-07-22 DIAGNOSIS — R5082 Postprocedural fever: Secondary | ICD-10-CM | POA: Diagnosis not present

## 2016-07-22 DIAGNOSIS — I428 Other cardiomyopathies: Secondary | ICD-10-CM | POA: Diagnosis not present

## 2016-07-22 DIAGNOSIS — J9601 Acute respiratory failure with hypoxia: Secondary | ICD-10-CM | POA: Diagnosis not present

## 2016-07-22 DIAGNOSIS — C61 Malignant neoplasm of prostate: Secondary | ICD-10-CM | POA: Diagnosis not present

## 2016-07-22 DIAGNOSIS — Z6826 Body mass index (BMI) 26.0-26.9, adult: Secondary | ICD-10-CM | POA: Diagnosis not present

## 2016-07-22 DIAGNOSIS — J9811 Atelectasis: Secondary | ICD-10-CM | POA: Diagnosis not present

## 2016-07-22 DIAGNOSIS — S225XXA Flail chest, initial encounter for closed fracture: Secondary | ICD-10-CM | POA: Diagnosis not present

## 2016-07-22 DIAGNOSIS — R0902 Hypoxemia: Secondary | ICD-10-CM | POA: Diagnosis not present

## 2016-07-22 DIAGNOSIS — J939 Pneumothorax, unspecified: Secondary | ICD-10-CM | POA: Diagnosis not present

## 2016-07-22 DIAGNOSIS — Z8679 Personal history of other diseases of the circulatory system: Secondary | ICD-10-CM

## 2016-07-22 DIAGNOSIS — Z5189 Encounter for other specified aftercare: Secondary | ICD-10-CM | POA: Diagnosis not present

## 2016-07-22 DIAGNOSIS — R4189 Other symptoms and signs involving cognitive functions and awareness: Secondary | ICD-10-CM | POA: Diagnosis not present

## 2016-07-22 DIAGNOSIS — F17211 Nicotine dependence, cigarettes, in remission: Secondary | ICD-10-CM | POA: Diagnosis not present

## 2016-07-22 DIAGNOSIS — N179 Acute kidney failure, unspecified: Secondary | ICD-10-CM | POA: Diagnosis not present

## 2016-07-22 DIAGNOSIS — R570 Cardiogenic shock: Secondary | ICD-10-CM | POA: Diagnosis not present

## 2016-07-22 DIAGNOSIS — R531 Weakness: Secondary | ICD-10-CM | POA: Diagnosis not present

## 2016-07-22 DIAGNOSIS — R9431 Abnormal electrocardiogram [ECG] [EKG]: Secondary | ICD-10-CM | POA: Diagnosis not present

## 2016-07-22 DIAGNOSIS — J9610 Chronic respiratory failure, unspecified whether with hypoxia or hypercapnia: Secondary | ICD-10-CM | POA: Diagnosis not present

## 2016-07-22 DIAGNOSIS — I501 Left ventricular failure: Secondary | ICD-10-CM | POA: Diagnosis not present

## 2016-07-22 DIAGNOSIS — S0990XA Unspecified injury of head, initial encounter: Secondary | ICD-10-CM | POA: Diagnosis not present

## 2016-07-22 DIAGNOSIS — R000 Tachycardia, unspecified: Secondary | ICD-10-CM | POA: Diagnosis not present

## 2016-07-22 DIAGNOSIS — I2582 Chronic total occlusion of coronary artery: Secondary | ICD-10-CM | POA: Diagnosis not present

## 2016-07-22 DIAGNOSIS — I25118 Atherosclerotic heart disease of native coronary artery with other forms of angina pectoris: Secondary | ICD-10-CM | POA: Diagnosis not present

## 2016-07-22 DIAGNOSIS — Z8674 Personal history of sudden cardiac arrest: Secondary | ICD-10-CM | POA: Diagnosis not present

## 2016-07-22 DIAGNOSIS — I351 Nonrheumatic aortic (valve) insufficiency: Secondary | ICD-10-CM | POA: Diagnosis not present

## 2016-07-22 DIAGNOSIS — D696 Thrombocytopenia, unspecified: Secondary | ICD-10-CM | POA: Diagnosis not present

## 2016-07-22 DIAGNOSIS — I440 Atrioventricular block, first degree: Secondary | ICD-10-CM | POA: Diagnosis not present

## 2016-07-22 DIAGNOSIS — R0602 Shortness of breath: Secondary | ICD-10-CM | POA: Diagnosis not present

## 2016-07-22 DIAGNOSIS — R079 Chest pain, unspecified: Secondary | ICD-10-CM | POA: Diagnosis not present

## 2016-07-22 HISTORY — DX: Personal history of other diseases of the circulatory system: Z86.79

## 2016-07-22 HISTORY — DX: Cardiac arrest, cause unspecified: I46.9

## 2016-08-05 ENCOUNTER — Telehealth: Payer: Self-pay | Admitting: *Deleted

## 2016-08-05 NOTE — Telephone Encounter (Signed)
Returned wife's phone call regarding upcoming appointments. Wife stated," Brevon had a heart attack three weeks ago today and he is still in ICU with a tube in his mouth. He had an arrhytmia at the gym. They did CPR at the gym, in the ambulance and at the hospital. Every rib and the sternum was cracked. They haven't taken him to the cath lab yet to see how much damage was done to his heart. The surgeon has gone in and repaired the ribs and sternum. He still has to have a procedure so, he can get a defibrillator. My question for Dr. Alen Blew is," what happens if he doesn't get the Lupron injection for this cycle? Per Dr. Alen Blew, it's OK. The Lupron is still in his system for weeks up to a month. Wife verbalized understanding. She will call to reschedule August appointments.

## 2016-08-07 ENCOUNTER — Other Ambulatory Visit: Payer: Medicare HMO

## 2016-08-07 ENCOUNTER — Ambulatory Visit: Payer: Medicare HMO

## 2016-08-07 ENCOUNTER — Ambulatory Visit: Payer: Medicare HMO | Admitting: Oncology

## 2016-08-09 ENCOUNTER — Other Ambulatory Visit: Payer: Medicare HMO

## 2016-08-09 ENCOUNTER — Ambulatory Visit: Payer: Medicare HMO | Admitting: Oncology

## 2016-08-09 ENCOUNTER — Ambulatory Visit: Payer: Medicare HMO

## 2016-08-27 DIAGNOSIS — I2510 Atherosclerotic heart disease of native coronary artery without angina pectoris: Secondary | ICD-10-CM | POA: Diagnosis not present

## 2016-08-27 DIAGNOSIS — Z0389 Encounter for observation for other suspected diseases and conditions ruled out: Secondary | ICD-10-CM | POA: Diagnosis not present

## 2016-08-27 DIAGNOSIS — S225XXA Flail chest, initial encounter for closed fracture: Secondary | ICD-10-CM | POA: Diagnosis not present

## 2016-08-27 DIAGNOSIS — C61 Malignant neoplasm of prostate: Secondary | ICD-10-CM | POA: Diagnosis not present

## 2016-08-27 DIAGNOSIS — I25119 Atherosclerotic heart disease of native coronary artery with unspecified angina pectoris: Secondary | ICD-10-CM | POA: Diagnosis not present

## 2016-08-27 DIAGNOSIS — I4901 Ventricular fibrillation: Secondary | ICD-10-CM | POA: Diagnosis not present

## 2016-08-27 DIAGNOSIS — I468 Cardiac arrest due to other underlying condition: Secondary | ICD-10-CM | POA: Diagnosis not present

## 2016-08-27 DIAGNOSIS — G939 Disorder of brain, unspecified: Secondary | ICD-10-CM | POA: Diagnosis not present

## 2016-08-27 DIAGNOSIS — G931 Anoxic brain damage, not elsewhere classified: Secondary | ICD-10-CM | POA: Diagnosis not present

## 2016-08-27 DIAGNOSIS — I469 Cardiac arrest, cause unspecified: Secondary | ICD-10-CM | POA: Diagnosis not present

## 2016-08-27 DIAGNOSIS — R197 Diarrhea, unspecified: Secondary | ICD-10-CM | POA: Diagnosis not present

## 2016-08-27 DIAGNOSIS — Z8546 Personal history of malignant neoplasm of prostate: Secondary | ICD-10-CM | POA: Diagnosis not present

## 2016-08-27 DIAGNOSIS — Z95810 Presence of automatic (implantable) cardiac defibrillator: Secondary | ICD-10-CM | POA: Diagnosis not present

## 2016-08-27 DIAGNOSIS — Z955 Presence of coronary angioplasty implant and graft: Secondary | ICD-10-CM | POA: Diagnosis not present

## 2016-08-27 DIAGNOSIS — Z7982 Long term (current) use of aspirin: Secondary | ICD-10-CM | POA: Diagnosis not present

## 2016-08-27 DIAGNOSIS — R4189 Other symptoms and signs involving cognitive functions and awareness: Secondary | ICD-10-CM | POA: Diagnosis not present

## 2016-08-27 DIAGNOSIS — Z8674 Personal history of sudden cardiac arrest: Secondary | ICD-10-CM | POA: Diagnosis not present

## 2016-08-27 DIAGNOSIS — R570 Cardiogenic shock: Secondary | ICD-10-CM | POA: Diagnosis not present

## 2016-08-27 DIAGNOSIS — J9600 Acute respiratory failure, unspecified whether with hypoxia or hypercapnia: Secondary | ICD-10-CM | POA: Diagnosis not present

## 2016-08-27 DIAGNOSIS — G4700 Insomnia, unspecified: Secondary | ICD-10-CM | POA: Diagnosis not present

## 2016-08-27 DIAGNOSIS — D649 Anemia, unspecified: Secondary | ICD-10-CM | POA: Diagnosis not present

## 2016-08-27 DIAGNOSIS — J9601 Acute respiratory failure with hypoxia: Secondary | ICD-10-CM | POA: Diagnosis not present

## 2016-08-27 DIAGNOSIS — Z930 Tracheostomy status: Secondary | ICD-10-CM | POA: Diagnosis not present

## 2016-08-27 DIAGNOSIS — I429 Cardiomyopathy, unspecified: Secondary | ICD-10-CM | POA: Diagnosis not present

## 2016-08-27 DIAGNOSIS — R1310 Dysphagia, unspecified: Secondary | ICD-10-CM | POA: Diagnosis not present

## 2016-08-27 DIAGNOSIS — S225XXD Flail chest, subsequent encounter for fracture with routine healing: Secondary | ICD-10-CM | POA: Diagnosis not present

## 2016-08-27 DIAGNOSIS — Z7409 Other reduced mobility: Secondary | ICD-10-CM | POA: Diagnosis not present

## 2016-08-27 DIAGNOSIS — G9340 Encephalopathy, unspecified: Secondary | ICD-10-CM | POA: Diagnosis not present

## 2016-08-27 DIAGNOSIS — Z5189 Encounter for other specified aftercare: Secondary | ICD-10-CM | POA: Diagnosis not present

## 2016-08-27 DIAGNOSIS — A0472 Enterocolitis due to Clostridium difficile, not specified as recurrent: Secondary | ICD-10-CM | POA: Diagnosis not present

## 2016-08-27 DIAGNOSIS — E872 Acidosis: Secondary | ICD-10-CM | POA: Diagnosis not present

## 2016-08-27 DIAGNOSIS — R531 Weakness: Secondary | ICD-10-CM | POA: Diagnosis not present

## 2016-09-11 DIAGNOSIS — I2510 Atherosclerotic heart disease of native coronary artery without angina pectoris: Secondary | ICD-10-CM | POA: Diagnosis not present

## 2016-09-12 DIAGNOSIS — R1313 Dysphagia, pharyngeal phase: Secondary | ICD-10-CM | POA: Diagnosis not present

## 2016-09-13 DIAGNOSIS — I2510 Atherosclerotic heart disease of native coronary artery without angina pectoris: Secondary | ICD-10-CM | POA: Diagnosis not present

## 2016-09-13 DIAGNOSIS — I469 Cardiac arrest, cause unspecified: Secondary | ICD-10-CM | POA: Diagnosis not present

## 2016-09-13 DIAGNOSIS — Z23 Encounter for immunization: Secondary | ICD-10-CM | POA: Diagnosis not present

## 2016-09-16 DIAGNOSIS — R1313 Dysphagia, pharyngeal phase: Secondary | ICD-10-CM | POA: Diagnosis not present

## 2016-09-16 DIAGNOSIS — R2689 Other abnormalities of gait and mobility: Secondary | ICD-10-CM | POA: Diagnosis not present

## 2016-09-25 DIAGNOSIS — R1313 Dysphagia, pharyngeal phase: Secondary | ICD-10-CM | POA: Diagnosis not present

## 2016-09-25 DIAGNOSIS — R2689 Other abnormalities of gait and mobility: Secondary | ICD-10-CM | POA: Diagnosis not present

## 2016-10-02 DIAGNOSIS — K219 Gastro-esophageal reflux disease without esophagitis: Secondary | ICD-10-CM | POA: Diagnosis not present

## 2016-10-02 DIAGNOSIS — Z87891 Personal history of nicotine dependence: Secondary | ICD-10-CM | POA: Diagnosis not present

## 2016-10-02 DIAGNOSIS — R2689 Other abnormalities of gait and mobility: Secondary | ICD-10-CM | POA: Diagnosis not present

## 2016-10-02 DIAGNOSIS — R1313 Dysphagia, pharyngeal phase: Secondary | ICD-10-CM | POA: Diagnosis not present

## 2016-10-02 DIAGNOSIS — J342 Deviated nasal septum: Secondary | ICD-10-CM | POA: Diagnosis not present

## 2016-10-08 DIAGNOSIS — R1313 Dysphagia, pharyngeal phase: Secondary | ICD-10-CM | POA: Diagnosis not present

## 2016-10-08 DIAGNOSIS — M25562 Pain in left knee: Secondary | ICD-10-CM | POA: Diagnosis not present

## 2016-10-08 DIAGNOSIS — G8929 Other chronic pain: Secondary | ICD-10-CM | POA: Diagnosis not present

## 2016-10-08 DIAGNOSIS — Z7409 Other reduced mobility: Secondary | ICD-10-CM | POA: Diagnosis not present

## 2016-10-08 DIAGNOSIS — R29898 Other symptoms and signs involving the musculoskeletal system: Secondary | ICD-10-CM | POA: Diagnosis not present

## 2016-10-11 DIAGNOSIS — I255 Ischemic cardiomyopathy: Secondary | ICD-10-CM | POA: Diagnosis not present

## 2016-10-11 DIAGNOSIS — Z95810 Presence of automatic (implantable) cardiac defibrillator: Secondary | ICD-10-CM | POA: Diagnosis not present

## 2016-10-11 DIAGNOSIS — I2510 Atherosclerotic heart disease of native coronary artery without angina pectoris: Secondary | ICD-10-CM | POA: Diagnosis not present

## 2016-10-11 DIAGNOSIS — I469 Cardiac arrest, cause unspecified: Secondary | ICD-10-CM | POA: Diagnosis not present

## 2016-10-15 DIAGNOSIS — G8929 Other chronic pain: Secondary | ICD-10-CM | POA: Diagnosis not present

## 2016-10-15 DIAGNOSIS — R1313 Dysphagia, pharyngeal phase: Secondary | ICD-10-CM | POA: Diagnosis not present

## 2016-10-15 DIAGNOSIS — Z7409 Other reduced mobility: Secondary | ICD-10-CM | POA: Diagnosis not present

## 2016-10-15 DIAGNOSIS — R29898 Other symptoms and signs involving the musculoskeletal system: Secondary | ICD-10-CM | POA: Diagnosis not present

## 2016-10-15 DIAGNOSIS — M25562 Pain in left knee: Secondary | ICD-10-CM | POA: Diagnosis not present

## 2016-10-16 ENCOUNTER — Telehealth: Payer: Self-pay | Admitting: Oncology

## 2016-10-16 NOTE — Telephone Encounter (Signed)
Scheduled appt per FS request. Patient had called in 10/9 requesting to schedule a missed inj from august. Per Dr. Alen Blew said it was okay to reschedule and have f/u on 11/21. Patient is aware of appt dates and time.

## 2016-10-18 ENCOUNTER — Ambulatory Visit (HOSPITAL_BASED_OUTPATIENT_CLINIC_OR_DEPARTMENT_OTHER): Payer: Medicare HMO

## 2016-10-18 ENCOUNTER — Other Ambulatory Visit (HOSPITAL_BASED_OUTPATIENT_CLINIC_OR_DEPARTMENT_OTHER): Payer: Medicare HMO

## 2016-10-18 VITALS — BP 146/81 | HR 84 | Temp 98.0°F | Resp 18

## 2016-10-18 DIAGNOSIS — Z5111 Encounter for antineoplastic chemotherapy: Secondary | ICD-10-CM | POA: Diagnosis not present

## 2016-10-18 DIAGNOSIS — C61 Malignant neoplasm of prostate: Secondary | ICD-10-CM | POA: Diagnosis not present

## 2016-10-18 LAB — CBC WITH DIFFERENTIAL/PLATELET
BASO%: 0.3 % (ref 0.0–2.0)
BASOS ABS: 0 10*3/uL (ref 0.0–0.1)
EOS ABS: 0.5 10*3/uL (ref 0.0–0.5)
EOS%: 8.2 % — AB (ref 0.0–7.0)
HEMATOCRIT: 39.6 % (ref 38.4–49.9)
HEMOGLOBIN: 12.4 g/dL — AB (ref 13.0–17.1)
LYMPH#: 2.3 10*3/uL (ref 0.9–3.3)
LYMPH%: 37.8 % (ref 14.0–49.0)
MCH: 28.4 pg (ref 27.2–33.4)
MCHC: 31.3 g/dL — ABNORMAL LOW (ref 32.0–36.0)
MCV: 90.6 fL (ref 79.3–98.0)
MONO#: 0.8 10*3/uL (ref 0.1–0.9)
MONO%: 13.6 % (ref 0.0–14.0)
NEUT#: 2.4 10*3/uL (ref 1.5–6.5)
NEUT%: 40.1 % (ref 39.0–75.0)
Platelets: 237 10*3/uL (ref 140–400)
RBC: 4.37 10*6/uL (ref 4.20–5.82)
RDW: 14.8 % — AB (ref 11.0–14.6)
WBC: 6 10*3/uL (ref 4.0–10.3)

## 2016-10-18 LAB — COMPREHENSIVE METABOLIC PANEL
ALT: 8 U/L (ref 0–55)
AST: 14 U/L (ref 5–34)
Albumin: 3.4 g/dL — ABNORMAL LOW (ref 3.5–5.0)
Alkaline Phosphatase: 82 U/L (ref 40–150)
Anion Gap: 6 mEq/L (ref 3–11)
BUN: 18.2 mg/dL (ref 7.0–26.0)
CALCIUM: 8.7 mg/dL (ref 8.4–10.4)
CHLORIDE: 107 meq/L (ref 98–109)
CO2: 26 meq/L (ref 22–29)
CREATININE: 1.2 mg/dL (ref 0.7–1.3)
EGFR: >60 mL/min/{1.73_m2} (ref >60)
GLUCOSE: 99 mg/dL (ref 70–140)
POTASSIUM: 4.2 meq/L (ref 3.5–5.1)
SODIUM: 138 meq/L (ref 136–145)
Total Bilirubin: 0.39 mg/dL (ref 0.20–1.20)
Total Protein: 6.5 g/dL (ref 6.4–8.3)

## 2016-10-18 MED ORDER — LEUPROLIDE ACETATE (4 MONTH) 30 MG IM KIT
30.0000 mg | PACK | Freq: Once | INTRAMUSCULAR | Status: AC
  Administered 2016-10-18: 30 mg via INTRAMUSCULAR
  Filled 2016-10-18: qty 30

## 2016-10-18 NOTE — Patient Instructions (Signed)
Leuprolide depot injection What is this medicine? LEUPROLIDE (loo PROE lide) is a man-made protein that acts like a natural hormone in the body. It decreases testosterone in men and decreases estrogen in women. In men, this medicine is used to treat advanced prostate cancer. In women, some forms of this medicine may be used to treat endometriosis, uterine fibroids, or other male hormone-related problems. This medicine may be used for other purposes; ask your health care provider or pharmacist if you have questions. What should I tell my health care provider before I take this medicine? They need to know if you have any of these conditions: -diabetes -heart disease or previous heart attack -high blood pressure -high cholesterol -osteoporosis -pain or difficulty passing urine -spinal cord metastasis -stroke -tobacco smoker -unusual vaginal bleeding (women) -an unusual or allergic reaction to leuprolide, benzyl alcohol, other medicines, foods, dyes, or preservatives -pregnant or trying to get pregnant -breast-feeding How should I use this medicine? This medicine is for injection into a muscle or for injection under the skin. It is given by a health care professional in a hospital or clinic setting. The specific product will determine how it will be given to you. Make sure you understand which product you receive and how often you will receive it. Talk to your pediatrician regarding the use of this medicine in children. Special care may be needed. Overdosage: If you think you have taken too much of this medicine contact a poison control center or emergency room at once. NOTE: This medicine is only for you. Do not share this medicine with others. What if I miss a dose? It is important not to miss a dose. Call your doctor or health care professional if you are unable to keep an appointment. Depot injections: Depot injections are given either once-monthly, every 12 weeks, every 16 weeks, or  every 24 weeks depending on the product you are prescribed. The product you are prescribed will be based on if you are male or male, and your condition. Make sure you understand your product and dosing. What may interact with this medicine? Do not take this medicine with any of the following medications: -chasteberry This medicine may also interact with the following medications: -herbal or dietary supplements, like black cohosh or DHEA -male hormones, like estrogens or progestins and birth control pills, patches, rings, or injections -male hormones, like testosterone This list may not describe all possible interactions. Give your health care provider a list of all the medicines, herbs, non-prescription drugs, or dietary supplements you use. Also tell them if you smoke, drink alcohol, or use illegal drugs. Some items may interact with your medicine. What should I watch for while using this medicine? Visit your doctor or health care professional for regular checks on your progress. During the first weeks of treatment, your symptoms may get worse, but then will improve as you continue your treatment. You may get hot flashes, increased bone pain, increased difficulty passing urine, or an aggravation of nerve symptoms. Discuss these effects with your doctor or health care professional, some of them may improve with continued use of this medicine. Male patients may experience a menstrual cycle or spotting during the first months of therapy with this medicine. If this continues, contact your doctor or health care professional. What side effects may I notice from receiving this medicine? Side effects that you should report to your doctor or health care professional as soon as possible: -allergic reactions like skin rash, itching or hives, swelling of the   face, lips, or tongue -breathing problems -chest pain -depression or memory disorders -pain in your legs or groin -pain at site where injected or  implanted -severe headache -swelling of the feet and legs -visual changes -vomiting Side effects that usually do not require medical attention (report to your doctor or health care professional if they continue or are bothersome): -breast swelling or tenderness -decrease in sex drive or performance -diarrhea -hot flashes -loss of appetite -muscle, joint, or bone pains -nausea -redness or irritation at site where injected or implanted -skin problems or acne This list may not describe all possible side effects. Call your doctor for medical advice about side effects. You may report side effects to FDA at 1-800-FDA-1088. Where should I keep my medicine? This drug is given in a hospital or clinic and will not be stored at home. NOTE: This sheet is a summary. It may not cover all possible information. If you have questions about this medicine, talk to your doctor, pharmacist, or health care provider.    2016, Elsevier/Gold Standard. (2013-09-17 14:16:23)  

## 2016-10-21 LAB — PSA: Prostate Specific Ag, Serum: <0.1 ng/mL (ref 0.0–4.0)

## 2016-10-22 DIAGNOSIS — R29898 Other symptoms and signs involving the musculoskeletal system: Secondary | ICD-10-CM | POA: Diagnosis not present

## 2016-10-22 DIAGNOSIS — G8929 Other chronic pain: Secondary | ICD-10-CM | POA: Diagnosis not present

## 2016-10-22 DIAGNOSIS — Z7409 Other reduced mobility: Secondary | ICD-10-CM | POA: Diagnosis not present

## 2016-10-22 DIAGNOSIS — R1313 Dysphagia, pharyngeal phase: Secondary | ICD-10-CM | POA: Diagnosis not present

## 2016-10-22 DIAGNOSIS — M25562 Pain in left knee: Secondary | ICD-10-CM | POA: Diagnosis not present

## 2016-10-23 ENCOUNTER — Telehealth: Payer: Self-pay | Admitting: *Deleted

## 2016-10-23 ENCOUNTER — Encounter: Payer: Self-pay | Admitting: *Deleted

## 2016-10-23 DIAGNOSIS — Z1339 Encounter for screening examination for other mental health and behavioral disorders: Secondary | ICD-10-CM | POA: Diagnosis not present

## 2016-10-23 DIAGNOSIS — I2510 Atherosclerotic heart disease of native coronary artery without angina pectoris: Secondary | ICD-10-CM | POA: Diagnosis not present

## 2016-10-23 DIAGNOSIS — Z6823 Body mass index (BMI) 23.0-23.9, adult: Secondary | ICD-10-CM | POA: Diagnosis not present

## 2016-10-23 DIAGNOSIS — R1313 Dysphagia, pharyngeal phase: Secondary | ICD-10-CM | POA: Diagnosis not present

## 2016-10-23 DIAGNOSIS — N390 Urinary tract infection, site not specified: Secondary | ICD-10-CM | POA: Diagnosis not present

## 2016-10-23 NOTE — Telephone Encounter (Signed)
Spoke with patient.   Gave last PSA result.

## 2016-11-15 DIAGNOSIS — Z6823 Body mass index (BMI) 23.0-23.9, adult: Secondary | ICD-10-CM | POA: Diagnosis not present

## 2016-11-15 DIAGNOSIS — N390 Urinary tract infection, site not specified: Secondary | ICD-10-CM | POA: Diagnosis not present

## 2016-11-15 DIAGNOSIS — I951 Orthostatic hypotension: Secondary | ICD-10-CM | POA: Diagnosis not present

## 2016-11-15 DIAGNOSIS — I2510 Atherosclerotic heart disease of native coronary artery without angina pectoris: Secondary | ICD-10-CM | POA: Diagnosis not present

## 2016-11-20 ENCOUNTER — Encounter: Payer: Self-pay | Admitting: *Deleted

## 2016-11-20 ENCOUNTER — Other Ambulatory Visit: Payer: Self-pay | Admitting: *Deleted

## 2016-11-21 ENCOUNTER — Encounter: Payer: Self-pay | Admitting: *Deleted

## 2016-11-22 ENCOUNTER — Encounter: Payer: Self-pay | Admitting: *Deleted

## 2016-11-22 DIAGNOSIS — Z95810 Presence of automatic (implantable) cardiac defibrillator: Secondary | ICD-10-CM | POA: Diagnosis not present

## 2016-11-22 NOTE — Patient Outreach (Signed)
[facility]) Care Management  11/20/2016  AVANT PRINTY 14-May-1940 [id number]  Telephone Screen  Referral Date: 11/20/16 Referral Source: [facility] Referral Referral Reason: High Cost Claimant  Insurance: [facility]   Outreach telephone call to patient. HIPAA verified with patient. Patient was admitted to [facility] on 07/22/16 for cardiac arrest. Patient stated, he was exercising prior to the cardiac event and then he woke up a month later in the hospital. He verbalized not having any symptoms prior to the cardiac arrest. He doesn't remember any of the surrounding events during his hospital stay. Patient reported, he has a Pacemaker/ICD implanted. Patient was transitioned from the hospital to inpatient rehab. He was discharged from inpatient rehab on 09/10/16 with home health. Patient believes he has developed short term memory loss from the cardiac arrest. He relied on his spouse to answer several questions during the telephone screen. Patient reported, he had developed dizziness from his medication. He had a primary MD visit on 11/15/16 and the MD adjusted his Coreg dose. Patient believes his symptom has improved since the medication was adjusted. Patient verbalized not monitoring his blood pressure, although he is considering it. He asked about purchasing a blood monitor. Patient spoke about being diagnosed with a urinary tract infection (UTI). Patient stated, he had a Foley catheter during his hospital stay, which caused the infection. He received 2 separate unsuccessful antibiotic treatments for the UTI. Patient stated, his MD is referring him to a Urologist in [place]. Patient has delayed participation in cardiac rehab due to the UTI. He wants to wait until the UTI is addressed and then he may participate in cardiac rehab. Patient stated, he has tremors and a "bad knee" which has limited his mobility.  Patient discussed having a history of Prostate Cancer.  He is receiving Lupron injections every 4 months. He verbalized being in remission. [facility] services and benefits explained to patient. Patient agreed to services.   Plan: RM CM will send successful outreach letter to patient with introductory [employer] welcome package. RN CM notified [facility] administrative assistant: patient agreed to services and case opened. RN CM advised patient to contact RNCM for any needs or concerns. RN CM will send patient EMMI educational materials. RN CM will contact patient within the next week.   [name], RN, BSN, MHA/MSL, [place] Telephonic Care Manager Coordinator [facility] Direct Phone: [phone] Toll Free: [phone] Fax: [phone]

## 2016-11-26 ENCOUNTER — Other Ambulatory Visit: Payer: Self-pay | Admitting: *Deleted

## 2016-11-26 ENCOUNTER — Other Ambulatory Visit: Payer: Self-pay | Admitting: Oncology

## 2016-11-26 ENCOUNTER — Ambulatory Visit: Payer: Self-pay | Admitting: *Deleted

## 2016-11-26 DIAGNOSIS — C61 Malignant neoplasm of prostate: Secondary | ICD-10-CM

## 2016-11-26 DIAGNOSIS — I469 Cardiac arrest, cause unspecified: Secondary | ICD-10-CM

## 2016-11-27 ENCOUNTER — Telehealth: Payer: Self-pay | Admitting: Oncology

## 2016-11-27 ENCOUNTER — Other Ambulatory Visit (HOSPITAL_BASED_OUTPATIENT_CLINIC_OR_DEPARTMENT_OTHER): Payer: Medicare HMO

## 2016-11-27 ENCOUNTER — Ambulatory Visit (HOSPITAL_BASED_OUTPATIENT_CLINIC_OR_DEPARTMENT_OTHER): Payer: Medicare HMO | Admitting: Oncology

## 2016-11-27 VITALS — BP 152/98 | HR 81 | Temp 98.7°F | Resp 17 | Ht 66.0 in | Wt 151.3 lb

## 2016-11-27 DIAGNOSIS — E875 Hyperkalemia: Secondary | ICD-10-CM | POA: Diagnosis not present

## 2016-11-27 DIAGNOSIS — I469 Cardiac arrest, cause unspecified: Secondary | ICD-10-CM | POA: Insufficient documentation

## 2016-11-27 DIAGNOSIS — C61 Malignant neoplasm of prostate: Secondary | ICD-10-CM

## 2016-11-27 LAB — COMPREHENSIVE METABOLIC PANEL
ALK PHOS: 72 U/L (ref 40–150)
ALT: 19 U/L (ref 0–55)
ANION GAP: 6 meq/L (ref 3–11)
AST: 21 U/L (ref 5–34)
Albumin: 3.5 g/dL (ref 3.5–5.0)
BILIRUBIN TOTAL: 0.42 mg/dL (ref 0.20–1.20)
BUN: 18.9 mg/dL (ref 7.0–26.0)
CALCIUM: 9 mg/dL (ref 8.4–10.4)
CO2: 27 mEq/L (ref 22–29)
CREATININE: 1.2 mg/dL (ref 0.7–1.3)
Chloride: 107 mEq/L (ref 98–109)
Glucose: 87 mg/dl (ref 70–140)
Potassium: 4.4 mEq/L (ref 3.5–5.1)
Sodium: 140 mEq/L (ref 136–145)
TOTAL PROTEIN: 6.6 g/dL (ref 6.4–8.3)

## 2016-11-27 LAB — CBC WITH DIFFERENTIAL/PLATELET
BASO%: 0.4 % (ref 0.0–2.0)
Basophils Absolute: 0 10*3/uL (ref 0.0–0.1)
EOS ABS: 0.4 10*3/uL (ref 0.0–0.5)
EOS%: 7.1 % — ABNORMAL HIGH (ref 0.0–7.0)
HEMATOCRIT: 37.9 % — AB (ref 38.4–49.9)
HGB: 12.1 g/dL — ABNORMAL LOW (ref 13.0–17.1)
LYMPH#: 1.8 10*3/uL (ref 0.9–3.3)
LYMPH%: 37.1 % (ref 14.0–49.0)
MCH: 28.9 pg (ref 27.2–33.4)
MCHC: 31.9 g/dL — ABNORMAL LOW (ref 32.0–36.0)
MCV: 90.5 fL (ref 79.3–98.0)
MONO#: 0.8 10*3/uL (ref 0.1–0.9)
MONO%: 16.5 % — ABNORMAL HIGH (ref 0.0–14.0)
NEUT%: 38.9 % — AB (ref 39.0–75.0)
NEUTROS ABS: 1.9 10*3/uL (ref 1.5–6.5)
PLATELETS: 192 10*3/uL (ref 140–400)
RBC: 4.19 10*6/uL — ABNORMAL LOW (ref 4.20–5.82)
RDW: 15.5 % — ABNORMAL HIGH (ref 11.0–14.6)
WBC: 4.9 10*3/uL (ref 4.0–10.3)

## 2016-11-27 NOTE — Patient Outreach (Signed)
[facility]) Care Management  11/27/2016  Jesse Mann 01-Nov-1940 [id number]  Telephone Assessment:  Outgoing telephone call to patient. HIPAA identifiers verified with patient. RN CM and patient discussed several questions/goals of patient's to ask his MD. Patient voiced being mostly concerned with "what happens if his ICD fires while he is driving". RN CM encouraged patient to write the question down and talk with the MD during his next scheduled visit. Patient plans to purchase a blood pressure monitor and start monitoring his blood pressure daily. He stated, the dose change to the Coreg has helped with his dizziness. Patient plans to continue delaying cardiac rehab, until he feels better.   Patient spoke about another antibiotic being prescribed for his UTI. He stated, he was feeling nauseated, until he started the second round of antibiotics. Patient reported, he plans to contact his primary MD's office to inquire about the referral being made to an Urologist. Patient verbalized being diagnosed with cataracts. He wants to address prominent medical conditions and fully recover before considering cataract surgery.   Plan: RN CM will send primary MD barriers letter. RN CM advised patient to contact RN CM for any needs or concerns. RN CM advised patient to alert MD for any changes in conditions.  RN CM will contact patient within one week.  Jesse Bells, RN, BSN, MHA/MSL, [place] Telephonic Care Manager Coordinator [facility] Direct Phone: [phone] Toll Free: [phone] Fax: [phone]

## 2016-11-27 NOTE — Telephone Encounter (Signed)
Gave avs and calendar for February 2019 °

## 2016-11-27 NOTE — Progress Notes (Signed)
Hematology and Oncology Follow Up Visit  Jesse Mann [id number] 03/15/1940 76 y.o. 11/27/2016 11:38 AM Jesse Mann Jesse Mann, MDScott, Jesse Bouillon, MD   Principle Diagnosis: 28-year diagnosed with prostate cancer in December 2016. He had a Gleason score 5+4 = 9 and a PSA of 12.22. He developed biochemical relapse in July 2017 and PSA of 1.28.   Prior Therapy: He is status post radical prostatectomy done on 03/03/2015 utilizing a nerve sparing approach. 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 Tumor involves bilateral seminal vesicles with margins involved with cancer. 2 lymph nodes were involved for a total of 8 sampled. Seminal vesicle and vomits were also noted.  Current therapy: Lupron 30 mg injection given every 4 months first dose given on 08/08/2015.  Interim History: Jesse Mann presents today for a follow-up visit. Since the last visit, he developed a cardiac arrhythmia and required urgent hospitalization, ICU stay and a defibrillator placement.  He missed his appointment in August of this year and he received his last Lupron in October 2018. He continues to tolerates Lupron without any major complications. He denied any pathological fractures or bone pain. He does report some hot flashes but otherwise no recent complaints.  He denies any chest pain or syncope.  He does report a urinary tract infection currently receiving antibiotics.   He does not report any headaches, blurry vision, syncope or seizures. He does not report any fevers or chills or sweats. He does not report any cough, wheezing or hemoptysis. He does not report any nausea, vomiting or abdominal pain. He does not report any frequency urgency or hesitancy. He does report incontinence. Remaining review of systems unremarkable.  Medications: I have reviewed the patient's current medications.  Current Outpatient Medications  Medication Sig Dispense Refill  . acidophilus (RISAQUAD) CAPS capsule  Take 2 capsules by mouth daily. (Patient not taking: Reported on 11/21/2016) 30 capsule 0  . amiodarone (PACERONE) 200 MG tablet Take 200 mg daily by mouth.    Marland Kitchen aspirin 81 MG tablet Take 81 mg by mouth daily.    . carvedilol (COREG) 3.125 MG tablet Take 3.125 mg by mouth 2 (two) times daily with a meal.    . carvedilol (COREG) 6.25 MG tablet Take 6.25 mg 3 (three) times daily by mouth.    . famotidine (PEPCID) 40 MG tablet Take 40 mg at bedtime by mouth.    Marland Kitchen lisinopril (PRINIVIL,ZESTRIL) 5 MG tablet Take 5 mg daily by mouth.    . lovastatin (MEVACOR) 40 MG tablet Take 40 mg at bedtime by mouth.    . nitroGLYCERIN (NITROSTAT) 0.4 MG SL tablet Place 0.4 mg under the tongue every 5 (five) minutes as needed for chest pain.     . Probiotic Product (PROBIOTIC DAILY PO) Take 1 tablet by mouth daily.     . simvastatin (ZOCOR) 10 MG tablet Take 10 mg by mouth at bedtime.      No current facility-administered medications for this visit.      Allergies: No Known Allergies  Past Medical History, Surgical history, Social history, and Family History were reviewed and updated.   Physical Exam: Blood pressure (!) 152/98, pulse 81, temperature 98.7 F (37.1 C), temperature source Oral, resp. rate 17, height 5\' 6"  (1.676 m), weight 151 lb 4.8 oz (68.6 kg), SpO2 100 %.   ECOG: 0 General appearance: Well-appearing gentleman without distress. Head: Normocephalic, without obvious abnormality no oral ulcers or lesions. Neck: no adenopathy Lymph nodes: Cervical, supraclavicular, and  axillary nodes normal. Heart:regular rate and rhythm, S1, S2 normal, no murmur, click, rub or gallop Lung:chest clear, no wheezing, rales, normal symmetric air entry Abdomin: soft, non-tender, without masses or organomegaly no rebound or guarding. EXT:no erythema, induration, or nodules   Lab Results: Lab Results  Component Value Date   WBC 4.9 11/27/2016   HGB 12.1 (L) 11/27/2016   HCT 37.9 (L) 11/27/2016   MCV 90.5  11/27/2016   PLT 192 11/27/2016     Chemistry      Component Value Date/Time   NA 138 10/18/2016 1326   K 4.2 10/18/2016 1326   CL 110 03/06/2015 0242   CO2 26 10/18/2016 1326   BUN 18.2 10/18/2016 1326   CREATININE 1.2 10/18/2016 1326      Component Value Date/Time   CALCIUM 8.7 10/18/2016 1326   ALKPHOS 82 10/18/2016 1326   AST 14 10/18/2016 1326   ALT 8 10/18/2016 1326   BILITOT 0.39 10/18/2016 1326      Results for [name], [name] (MRN [id number]) as of 11/27/2016 11:25  Ref. Range 12/13/2015 12:06 04/10/2016 09:57 10/18/2016 13:26  Prostate Specific Ag, Serum Latest Ref Range: 0.0 - 4.0 ng/mL <0.1 <0.1 <0.1    Impression and Plan:   76 year old gentleman with the following issues:  1. Prostate cancer diagnosed in December 2016 with a Gleason score 4+5 = 9 and a PSA of 12. He is status post radical prostatectomy in February 2017 with His disease showed T3bN1. His PSA was never 0 and went to 0.83 and to 1.28 in July 2017.  He developed biochemical relapse and currently on Lupron. He received 30 mg injection on 08/08/2015 and tolerated it well.   Plan is to continue with Lupron at this time every 4 months for at least 2 years and possibly indefinitely.  His PSA continues to be undetectable.  2. Bone health: I recommended starting calcium and vitamin D supplements moving forward.  3. Hyperkalemia: Etiology is unclear and unrelated to his prostate cancer and treatment.  His potassium was normal in October 2018.  4. Follow-up: Will be in February 2019 for his next injection.   Zola Button, MD 11/21/201811:38 AM

## 2016-11-28 LAB — PSA: Prostate Specific Ag, Serum: <0.1 ng/mL (ref 0.0–4.0)

## 2016-11-29 ENCOUNTER — Telehealth: Payer: Self-pay | Admitting: *Deleted

## 2016-11-29 ENCOUNTER — Other Ambulatory Visit: Payer: Self-pay | Admitting: *Deleted

## 2016-11-29 DIAGNOSIS — I469 Cardiac arrest, cause unspecified: Secondary | ICD-10-CM

## 2016-11-29 NOTE — Telephone Encounter (Signed)
-----   Message from Firas N Shadad, MD sent at 11/29/2016  9:14 AM EST ----- Please let him know his PSA is very low 

## 2016-11-29 NOTE — Telephone Encounter (Signed)
Spoke with patient. Gave results of last PSA 

## 2016-11-29 NOTE — Patient Outreach (Addendum)
[facility]) Care Management  [place]  11/27/2016   Jesse Mann 02/18/1940 [id number]  Social: Patient lives with his wife. He is independent with ADL's. He doesn't utilize any medical equipment. He continues to drive and he is capable of transporting himself to all medical appointments.   S/O: Patient was admitted to [facility] on 07/22/16 for cardiac arrest. Patient stated, he was exercising prior to the cardiac event and then he woke up a month later in the hospital. He verbalized not having any symptoms prior to the cardiac arrest. He doesn't remember any of the surrounding events during his hospital stay. Patient believes he has a Pacemaker/ICD implanted. 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 Patient was transitioned from the hospital to inpatient rehab on 08/27/16 and discharged on 09/10/16 with home health. Patient believes he has developed short term memory loss from the cardiac arrest. He relied on his spouse to answer several questions during the telephone screen. Patient reported, he had developed dizziness from his medication. He had a primary MD visit on 11/15/16.  MD adjusted his Coreg dose to 6.25 mg three times per day. Patient believes his symptom has improved since the medication was adjusted. Patient verbalized not monitoring his blood pressure, although he is considering it. He asked about purchasing a blood monitor. Patient spoke about being diagnosed with a urinary tract infection (UTI). Patient stated, he had a Foley catheter during his hospital stay, which caused the infection. He received 2 separate unsuccessful antibiotic treatments for the UTI. Patient stated, his MD is referring him to an Dealer in [place]. On 11/26/16, patient verbalized being prescribed another antibiotic. He believes his nausea has stopped after starting the newly prescribed antibiotics. Patient has delayed participation in cardiac  rehab due to the UTI. He wants to wait until the UTI is addressed and then he may participate in cardiac rehab. Patient stated, he has tremors and a "bad knee" which has limited his mobility.  Patient discussed having a history of Prostate Cancer. He is receiving Lupron injections every 4 months. He spoke about being in remission. Per EMR, patient had a visit with the Oncologist on 11/27/16 and his PSA continues to be undetectable. The next Lupron injection is scheduled in February 2019. Patient verbalized being diagnosed with cataracts. He wants to address prominent medical conditions and fully recover before considering cataract surgery.   Appointments:  Cardiology appointment will be scheduled in January 2019. Oncology appointment will be scheduled in February 2019.     Encounter Medications:  Outpatient Encounter Medications as of 11/29/2016  Medication Sig  . acidophilus (RISAQUAD) CAPS capsule Take 2 capsules by mouth daily. (Patient not taking: Reported on 11/21/2016)  . amiodarone (PACERONE) 200 MG tablet Take 200 mg daily by mouth.  Marland Kitchen aspirin 81 MG tablet Take 81 mg by mouth daily.  . carvedilol (COREG) 3.125 MG tablet Take 3.125 mg by mouth 2 (two) times daily with a meal.  . carvedilol (COREG) 6.25 MG tablet Take 6.25 mg 3 (three) times daily by mouth.  . famotidine (PEPCID) 40 MG tablet Take 40 mg at bedtime by mouth.  Marland Kitchen lisinopril (PRINIVIL,ZESTRIL) 5 MG tablet Take 5 mg daily by mouth.  . lovastatin (MEVACOR) 40 MG tablet Take 40 mg at bedtime by mouth.  . nitroGLYCERIN (NITROSTAT) 0.4 MG SL tablet Place 0.4 mg under the tongue every 5 (five) minutes as needed for chest pain.   . Probiotic  Product (PROBIOTIC DAILY PO) Take 1 tablet by mouth daily.   . simvastatin (ZOCOR) 10 MG tablet Take 10 mg by mouth at bedtime.    No facility-administered encounter medications on file as of 11/29/2016.     Functional Status:  In your present state of health, do you have any difficulty  performing the following activities: 11/21/2016  Hearing? N  Vision? N  Difficulty concentrating or making decisions? Y  Walking or climbing stairs? Y  Dressing or bathing? N  Doing errands, shopping? N  Preparing Food and eating ? N  Using the Toilet? N  In the past six months, have you accidently leaked urine? Y  Do you have problems with loss of bowel control? N  Managing your Medications? N  Managing your Finances? N  Housekeeping or managing your Housekeeping? Y  Some recent data might be hidden    Fall/Depression Screening: Fall Risk  11/20/2016  Falls in the past year? Yes  Number falls in past yr: 1  Injury with Fall? No  Risk for fall due to : History of fall(s);Impaired balance/gait;Impaired mobility  Follow up Falls evaluation completed   PHQ 2/9 Scores 11/20/2016  PHQ - 2 Score 1    Assessment:  THN CM Care Plan Problem One     Most Recent Value  Care Plan Problem One  Knowledge deficit related to lacking recollection of events surrounding cardiac event.  Role Documenting the Problem One  Care Management Telephonic [place] for Problem One  Active  THN Long Term Goal   Pt will verbalize understanding of a cardiac event, potential complications, individual risk factors, and the function of the Pacemaker/ICD within the next 31 - 90 days.  Interventions for Problem One Long Term Goal  Reassess patient's knowledge level and desire to learn, provide education about risk factors, diet, activity, medications, signs/symptoms, and teachback method utilized by nursing staff.    THN CM Short Term Goal #1   Pt will keep all scheduled MD appointments in the next 30 days.  THN CM Short Term Goal #1 Start Date  11/20/16  Interventions for Short Term Goal #1  RN CM provided patient with scheduled appointment time and date for MD visit and lab work. on 11/27/16.  THN CM Short Term Goal #2   Pt will discuss 2 - 4 risk factors discussed with MD related to his cardiac  arrest within the next 30 days.  THN CM Short Term Goal #2 Start Date  11/20/16  Interventions for Short Term Goal #2  RN CM educated and discussed with patient appropriate questions/concerns/goals to discuss with MD during scheduled visit      Plan:  RN CM will contact patient within the next week.  RN CM will send barrier letters and route encounter to PCP. RN CM advised patient to contact RN CM for any needs or concerns.   [name], RN, BSN, MHA/MSL, [place] Telephonic Care Manager Coordinator [facility] Direct Phone: [phone] Toll Free: [phone] Fax: [phone]

## 2016-12-05 DIAGNOSIS — N3000 Acute cystitis without hematuria: Secondary | ICD-10-CM | POA: Diagnosis not present

## 2016-12-05 DIAGNOSIS — C61 Malignant neoplasm of prostate: Secondary | ICD-10-CM | POA: Diagnosis not present

## 2016-12-09 ENCOUNTER — Other Ambulatory Visit: Payer: Self-pay | Admitting: *Deleted

## 2016-12-09 DIAGNOSIS — I469 Cardiac arrest, cause unspecified: Secondary | ICD-10-CM

## 2016-12-10 NOTE — Patient Outreach (Signed)
[facility]) Care Management  12/09/16  Jesse Mann 17-Oct-1940 [id number]  Telephone Assessment  Outreach attempt to patient. HIPAA identifiers verified with patient. Patient stated, "He is doing well". Patient had an Urologist appointment last week with Dr. Louis Mann. Patient's urinalysis results were negative, per patient. "He continues to take antibiotics with a few more doses left". He has a scheduled appointment with his primary MD on 12/20/16. He plans to ask the MD about an exercise routine. Patient stated, he continues to exhibit "short term memory loss" due to the cardiac arrest. He asked his spouse about his cardiology appointment. Spouse stated, the next appointment will be scheduled in January. Patient had an appointment with his Oncologist on 11/27/16. Per patient, his PSA remains undetectable and he plans to get a Lupron injection in February 2019.   Plan: RN CM advised patient to contact RN CM for any needs or concerns. RN CM advised patient to alert MD for any changes in conditions.  RN CM will contact patient within one month.    [name], RN, BSN, MHA/MSL, [place] Telephonic Care Manager Coordinator [facility] Direct Phone: [phone] Toll Free: [phone] Fax: [phone]

## 2016-12-18 ENCOUNTER — Other Ambulatory Visit: Payer: Self-pay | Admitting: *Deleted

## 2016-12-18 ENCOUNTER — Ambulatory Visit: Payer: Self-pay | Admitting: *Deleted

## 2016-12-18 DIAGNOSIS — I469 Cardiac arrest, cause unspecified: Secondary | ICD-10-CM

## 2016-12-18 NOTE — Patient Outreach (Signed)
[facility]) Care Management  12/18/2016  Jesse Mann 18-Aug-1940 [id number]   Telephone Assessment:  Outreach telephone call to patient. HIPAA identifiers verified. Patient reported, he is feeling a lot better. He finished his antibiotics on 11/12/16. Patient has a follow-up appointment scheduled with his primary MD on 12/20/16. He plans to speak with the MD about an exercise routine and possibly questions regarding his pacemaker. He may wait until January to speak with the Cardiologist regarding his pacemaker. RN CM encouraged patient to speak with each doctor about his Pacemaker. Patient spoke about performing in 5 different choirs after being discharged from the hospital. He plays the bass in a band. He has a favorite song that he sings yearly, called Rusty Nails every Easter. 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 Patient stated, he continues to drive himself to all activities and his medical appointments.  Plan: RN CM advised patient to contact RN CM for any needs or concerns. RN CM advised patient to alert MD for any changes in conditions. RN CM will notify [facility] Case Management Assistant regarding case closure.  RN CM will contact patient within one month.  [name], RN, BSN, MHA/MSL, [place] Telephonic Care Manager Coordinator [facility] Direct Phone: [phone] Cell Phone: [phone] Toll Free: [phone] Fax: [phone]

## 2016-12-20 DIAGNOSIS — N390 Urinary tract infection, site not specified: Secondary | ICD-10-CM | POA: Diagnosis not present

## 2016-12-20 DIAGNOSIS — Z6825 Body mass index (BMI) 25.0-25.9, adult: Secondary | ICD-10-CM | POA: Diagnosis not present

## 2016-12-23 DIAGNOSIS — N321 Vesicointestinal fistula: Secondary | ICD-10-CM | POA: Diagnosis not present

## 2016-12-23 DIAGNOSIS — N3000 Acute cystitis without hematuria: Secondary | ICD-10-CM | POA: Diagnosis not present

## 2017-01-02 DIAGNOSIS — K632 Fistula of intestine: Secondary | ICD-10-CM | POA: Diagnosis not present

## 2017-01-02 DIAGNOSIS — N321 Vesicointestinal fistula: Secondary | ICD-10-CM | POA: Diagnosis not present

## 2017-01-08 ENCOUNTER — Encounter: Payer: Self-pay | Admitting: *Deleted

## 2017-01-08 DIAGNOSIS — I469 Cardiac arrest, cause unspecified: Secondary | ICD-10-CM

## 2017-01-10 DIAGNOSIS — N321 Vesicointestinal fistula: Secondary | ICD-10-CM | POA: Diagnosis not present

## 2017-01-16 ENCOUNTER — Ambulatory Visit: Payer: Self-pay | Admitting: *Deleted

## 2017-01-16 DIAGNOSIS — Z4502 Encounter for adjustment and management of automatic implantable cardiac defibrillator: Secondary | ICD-10-CM | POA: Diagnosis not present

## 2017-01-16 DIAGNOSIS — I255 Ischemic cardiomyopathy: Secondary | ICD-10-CM | POA: Diagnosis not present

## 2017-01-17 ENCOUNTER — Other Ambulatory Visit: Payer: Self-pay | Admitting: *Deleted

## 2017-01-17 ENCOUNTER — Ambulatory Visit: Payer: Self-pay | Admitting: *Deleted

## 2017-01-17 DIAGNOSIS — I469 Cardiac arrest, cause unspecified: Secondary | ICD-10-CM

## 2017-01-17 NOTE — Patient Outreach (Addendum)
[facility]) Care Management  01/17/2017  Jesse Mann 04/22/1940 [id number]   Telephone Assessment  Outreach telephone call to patient. HIPAA verified with patient. Per patient, he had a procedure completed within the last month. 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 Patient verbalized, his doctor is recommending a Colonoscopy, however he has to be cleared by a Cardiologist. Patient stated, he had a consult with a Surgeon, who mentioned she is hesitant about the Colonoscopy based on his medical history. Patient has a scheduled visit with the Cardiologist on 02/21/17. Patient reported, he was given the "green light" to begin cardiac rehab. He wants to wait to complete cardiac rehab until his other medical issues are addressed. Patient voiced wanting to feel better is his overall goal for now. Patient reported, his last weight at 154 pounds. He is not monitoring his blood pressure at home. He believes it was good during his last MD visit. Patient is having difficulty remembering topics/conversations do to memory.   Plan: RN CM will send referral to [facility] RN for assistance with medical management.  RN CM will send patient a financial application for [facility] per request.   [name], RN, BSN, MHA/MSL, [place] Direct Phone: [phone] Cell Phone: [phone] Toll Free: [phone] Fax: [phone]

## 2017-01-20 DIAGNOSIS — N321 Vesicointestinal fistula: Secondary | ICD-10-CM | POA: Diagnosis not present

## 2017-01-20 DIAGNOSIS — N390 Urinary tract infection, site not specified: Secondary | ICD-10-CM | POA: Diagnosis not present

## 2017-01-28 ENCOUNTER — Other Ambulatory Visit: Payer: Self-pay

## 2017-01-28 DIAGNOSIS — Z951 Presence of aortocoronary bypass graft: Secondary | ICD-10-CM | POA: Diagnosis not present

## 2017-01-28 DIAGNOSIS — Z95810 Presence of automatic (implantable) cardiac defibrillator: Secondary | ICD-10-CM | POA: Diagnosis not present

## 2017-01-28 NOTE — Patient Outreach (Signed)
Telephone assessment:  Placed call to patient and explained reason for call.  Patient was at an appointment for cardiac rehab.    Offered home visit for an assessment and patient has accepted.   PLAN: home visit for 01/30/2017 at 12:00. Confirmed address.  Tomasa Rand, RN, BSN, CEN [facility] [employer] [phone]

## 2017-01-30 ENCOUNTER — Other Ambulatory Visit: Payer: Self-pay

## 2017-01-30 NOTE — Patient Outreach (Signed)
[facility]) Care Management   01/30/2017  Jesse Mann 1940/08/21 [id number]  Jesse Mann is an 77 y.o. male  Subjective: Arrived for initial home visit. Wife present and engaged during visit.  Patient reports that he is doing well after cardiac arrest about 8 months ago while working out at [employer].  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  Patient reports brown sediment in his urine. Reports that he is on his 3 course of antibiotics.  Patient reports that he has seen a surgeon and needs to have a colonscopy first.  Reports that he has an appointment with GI to discuss colonscopy on 02/27/2017    Patient reports his other concern is related to dry cough and always needing to clear his throat.  Patient would like a pharmacy referral to see if this is related to medications.   Objective:  Awake and alert. Very pleasant. Home neat and clean.  Ambulating without difficulty  Vitals:   01/30/17 1216  BP: 106/62  Pulse: 74  Resp: 18  SpO2: 99%  Weight: 154 lb (69.9 kg)  Height: 1.676 m (5\' 6" )   Review of Systems  Constitutional: Negative.   HENT: Negative.   Eyes: Negative.   Respiratory: Positive for cough.   Cardiovascular: Negative.   Gastrointestinal: Positive for nausea.  Genitourinary:       Reports that when her voids he has brown pieces coming out in his urine. .  Currently on 3rd round of antibiotics for UTI  Musculoskeletal:       Left knee pain  Skin: Negative.   Psychiatric/Behavioral: Positive for memory loss. The patient has insomnia.     Physical Exam  Constitutional: He is oriented to person, place, and time. He appears well-developed and well-nourished.  Cardiovascular: Normal rate, normal heart sounds and intact distal pulses.  Respiratory: Effort normal.  GI: Soft. Bowel sounds are normal.  Musculoskeletal: Normal range of motion. He exhibits no  edema.  Neurological: He is alert and oriented to person, place, and time.  2019, [place],  Thursday,  Jan.  Skin: Skin is warm and dry.  Psychiatric: He has a normal mood and affect. His behavior is normal. Judgment and thought content normal.    Encounter Medications:   Outpatient Encounter Medications as of 01/30/2017  Medication Sig  . amiodarone (PACERONE) 200 MG tablet Take 200 mg daily by mouth.  Marland Kitchen aspirin 81 MG tablet Take 81 mg by mouth daily.  . carvedilol (COREG) 6.25 MG tablet Take 6.25 mg 3 (three) times daily by mouth.  . famotidine (PEPCID) 40 MG tablet Take 40 mg at bedtime by mouth.  Marland Kitchen lisinopril (PRINIVIL,ZESTRIL) 5 MG tablet Take 5 mg daily by mouth.  . lovastatin (MEVACOR) 40 MG tablet Take 40 mg at bedtime by mouth.  . nitrofurantoin, macrocrystal-monohydrate, (MACROBID) 100 MG capsule Take 100 mg by mouth daily.  . Probiotic Product (PROBIOTIC DAILY PO) Take 1 tablet by mouth daily.   . [DISCONTINUED] acidophilus (RISAQUAD) CAPS capsule Take 2 capsules by mouth daily.  . nitroGLYCERIN (NITROSTAT) 0.4 MG SL tablet Place 0.4 mg under the tongue every 5 (five) minutes as needed for chest pain.   . [DISCONTINUED] carvedilol (COREG) 3.125 MG tablet Take 3.125 mg by mouth 2 (two) times daily with a meal.  . [DISCONTINUED] simvastatin (ZOCOR) 10 MG tablet Take 10 mg by mouth at bedtime.    No facility-administered encounter  medications on file as of 01/30/2017.     Functional Status:   In your present state of health, do you have any difficulty performing the following activities: 01/30/2017 11/21/2016  Hearing? Y N  Comment Right eye decreased -  Vision? N N  Difficulty concentrating or making decisions? Tempie Donning  Walking or climbing stairs? N Y  Dressing or bathing? N N  Doing errands, shopping? N N  Preparing Food and eating ? N N  Using the Toilet? N N  In the past six months, have you accidently leaked urine? N Y  Do you have problems with loss of bowel control? Y N   Managing your Medications? N N  Managing your Finances? N N  Housekeeping or managing your Housekeeping? N Y  Some recent data might be hidden    Fall/Depression Screening:    Fall Risk  01/30/2017 11/20/2016  Falls in the past year? Yes Yes  Number falls in past yr: 1 1  Injury with Fall? No No  Risk for fall due to : - History of fall(s);Impaired balance/gait;Impaired mobility  Follow up - Falls evaluation completed   PHQ 2/9 Scores 01/30/2017 11/20/2016  PHQ - 2 Score 0 1    Assessment:   (1) reviewed [facility] program. Provided new patient packet. Provided my contact card and [facility] nurse line magnet. Reviewed consent form and patient signed. (2) frequent UTI's.  On 3rd course of antibiotics. MD thinks is related to fistula. (3) concern for future cardiac arrest.  Active with [facility] cardiac rehab. (4) dry cough and unable to clear throat.  Patient is concerns that this is related to medications. (5) pending GI consult for colonscopy.   Plan:  (1) consent scanned into chart. Reviewed with patient I would call him back in 2 weeks to follow up on care coordination. (2) Encouraged patient to talk with MD about being off antibiotics prior to colonscopy since he will run out. (3) Reviewed with patient to inquire about driving and concerns for future risk of cardiac event. (4) Pharmacy referral placed. Reviewed with patient to be expecting a call. (5) encouraged patient to call Dr. Melina Copa to see if he could get an earlier appointment.   Patient did not identify any long term goals.  Reviewed short term goal of coordinating care for pharmacy and GI.  Will call patient back in 2 weeks.  Barrier letter and this home visit sent to MD.  [facility] CM Care Plan Problem One     Most Recent Value  Care Plan Problem One  recent UTI and concerns for infections related to fistula.   Role Documenting the Problem One  Care Management Coordinator  Care Plan for Problem One  Active  TH[facility] CM Short  Term Goal #1   Recent infection due to fistula as evidence by 3rd course of antibiotics  [facility] CM Short Term Goal #1 Start Date  01/30/17  Interventions for Short Term Goal #1  Reviewed with patient signs of worsening infection. Encouraged patient to disucss long term antibiotics with MD until intervention can be decided upon.      Tomasa Rand, RN, BSN, CEN [facility] [employer] [phone]

## 2017-02-06 ENCOUNTER — Other Ambulatory Visit: Payer: Self-pay | Admitting: Pharmacist

## 2017-02-06 NOTE — Patient Outreach (Signed)
[facility]) Care Management  [place]   02/06/2017  RESHAWN OSTLUND [birth year] [id number]  Subjective:  Patient was referred to [place] Pharmacist by [facility] RN Estill Bamberg for medication review.    Patient reports he has a dry cough and is wondering if it may be medication related.   Successful phone outreach to patient, HIPAA details verified, purpose of call explained to patient.   Patient reports his PCP is Dr Rod Holler.  He reports he has not discussed his dry cough with his PCP.    Patient denies other questions/concerns related to his medications.    Objective:   Current Medications: Current Outpatient Medications  Medication Sig Dispense Refill  . amiodarone (PACERONE) 200 MG tablet Take 200 mg daily by mouth.    Marland Kitchen aspirin 81 MG tablet Take 81 mg by mouth daily.    . carvedilol (COREG) 6.25 MG tablet Take 6.25 mg 3 (three) times daily by mouth.    . famotidine (PEPCID) 40 MG tablet Take 40 mg at bedtime by mouth.    Marland Kitchen lisinopril (PRINIVIL,ZESTRIL) 5 MG tablet Take 5 mg daily by mouth.    . lovastatin (MEVACOR) 40 MG tablet Take 40 mg at bedtime by mouth.    . nitrofurantoin, macrocrystal-monohydrate, (MACROBID) 100 MG capsule Take 100 mg by mouth daily.    . nitroGLYCERIN (NITROSTAT) 0.4 MG SL tablet Place 0.4 mg under the tongue every 5 (five) minutes as needed for chest pain.     . Probiotic Product (PROBIOTIC DAILY PO) Take 1 tablet by mouth daily.      No current facility-administered medications for this visit.     Functional Status: In your present state of health, do you have any difficulty performing the following activities: 01/30/2017 11/21/2016  Hearing? Y N  Comment Right eye decreased -  Vision? N N  Difficulty concentrating or making decisions? Tempie Donning  Walking or climbing stairs? N Y  Dressing or bathing? N N  Doing errands, shopping? N N  Preparing Food and eating ? N N  Using the Toilet? N N  In the past six months, have you  accidently leaked urine? N Y  Do you have problems with loss of bowel control? Y N  Managing your Medications? N N  Managing your Finances? N N  Housekeeping or managing your Housekeeping? N Y  Some recent data might be hidden    Fall/Depression Screening: Fall Risk  01/30/2017 11/20/2016  Falls in the past year? Yes Yes  Number falls in past yr: 1 1  Injury with Fall? No No  Risk for fall due to : - History of fall(s);Impaired balance/gait;Impaired mobility  Follow up - Falls evaluation completed   PHQ 2/9 Scores 01/30/2017 11/20/2016  PHQ - 2 Score 0 1    Assessment:  Medication review per patient report and medication list in this chart.   Drugs sorted by system:  Cardiovascular: -amiodarone -aspirin 81 mg -carvedilol---patient reports using 12.5 mg tabs and taking 6.25 mg (1/2 tab) three time daily  -lisinopril -lovastatin -sublingual nitroglycerin as needed  Gastrointestinal: -famotidine  Infectious Diseases: -nitrofurantoin   Miscellaneous: -probiotic    Other issues noted:   ACE-inhibitors may cause a dry cough.  Given patient complaints of dry cough and use of lisinopril, would suggest consideration to use of an ARB, in place of lisinopril, which has a lower incidence of cough.   Plan:  Will route note to PCP.    Will place follow-up call  to patient next week.   Karrie Meres, PharmD, [place] [phone]

## 2017-02-11 ENCOUNTER — Other Ambulatory Visit: Payer: Self-pay | Admitting: Pharmacist

## 2017-02-11 NOTE — Patient Outreach (Signed)
[facility]) Care Management  02/11/2017  Jesse Mann 10-07-1940 [id number]  Successful phone outreach to patient, HIPAA details verified.   Purpose of call to follow-up if he heard from Dr Nicki Reaper regarding his medications and cough.  Patient reports Dr Nicki Reaper discontinued lisinopril and started losartan 50 mg daily---reports he picked up from his pharmacy today.   Counseled patient losartan works very similar to lisinopril but has a lower incidence of dry cough associated with it.   Patient denies questions related to it.  He was reminded to contact prescriber with any concerns.    Plan:  Will place follow-up call to patient later this month to see how he is tolerating losartan.   Karrie Meres, PharmD, [place] [phone]

## 2017-02-13 DIAGNOSIS — N321 Vesicointestinal fistula: Secondary | ICD-10-CM | POA: Diagnosis not present

## 2017-02-19 ENCOUNTER — Other Ambulatory Visit: Payer: Self-pay

## 2017-02-19 NOTE — Patient Outreach (Signed)
Telephone follow up:  Placed call to patient for 2 week follow up. Patient reports that he is feeling good. Reports medication changes has helped "some".  States that he will have his colonscopy on 02/24/2017 and then will be back in touch with surgeon to fix his fistula. Reports that he continues to be on antibiotics for a urinary track infection.  Reviewed any other nursing goals and patient has declined any other goals at this time.  Reviewed with patient that [facility] pharmacist would be back in touch in a few weeks.  Also reviewed pending appointments with patient on 02/28/2017 at the cancer center. Patient verbalized understanding.  PLAN: will close to nursing at this time. Will send an inbasket message to [facility] to close case management start date when patient is discharged.  Will send update letter to MD.  Tomasa Rand, RN, BSN, CEN [place] Coordinator [phone]

## 2017-02-20 ENCOUNTER — Ambulatory Visit: Payer: Self-pay

## 2017-02-21 DIAGNOSIS — I2510 Atherosclerotic heart disease of native coronary artery without angina pectoris: Secondary | ICD-10-CM | POA: Diagnosis not present

## 2017-02-21 DIAGNOSIS — I255 Ischemic cardiomyopathy: Secondary | ICD-10-CM | POA: Diagnosis not present

## 2017-02-21 DIAGNOSIS — Z95810 Presence of automatic (implantable) cardiac defibrillator: Secondary | ICD-10-CM | POA: Diagnosis not present

## 2017-02-24 DIAGNOSIS — Z800 Family history of malignant neoplasm of digestive organs: Secondary | ICD-10-CM | POA: Diagnosis not present

## 2017-02-24 DIAGNOSIS — Z86010 Personal history of colonic polyps: Secondary | ICD-10-CM | POA: Diagnosis not present

## 2017-02-24 DIAGNOSIS — N321 Vesicointestinal fistula: Secondary | ICD-10-CM | POA: Diagnosis not present

## 2017-02-27 ENCOUNTER — Other Ambulatory Visit: Payer: Self-pay | Admitting: Pharmacist

## 2017-02-27 NOTE — Patient Outreach (Signed)
[place] [facility]) Care Management  02/27/2017  Jesse Mann 12-24-1940 [id number]  Successful phone outreach to patient, HIPAA details verified.    Purpose of call to verify how patient is doing with new medication, losartan, which he started approximately 2 weeks in place of lisinopril per his PCP's order.    Patient reports his dry cough has gotten a little better since MD made the medication change.    Patient denies other pharmacy related questions/concerns.   Plan:  Will close patient's pharmacy episode.   Will route MD and patient case closure  letters.   Patient aware he can contact [facility] Pharmacist is new needs arise.    Karrie Meres, PharmD, [place] [phone]

## 2017-02-28 ENCOUNTER — Inpatient Hospital Stay: Payer: Medicare HMO

## 2017-02-28 ENCOUNTER — Telehealth: Payer: Self-pay

## 2017-02-28 ENCOUNTER — Ambulatory Visit: Payer: Medicare HMO

## 2017-02-28 ENCOUNTER — Inpatient Hospital Stay: Payer: Medicare HMO | Attending: Oncology | Admitting: Oncology

## 2017-02-28 VITALS — BP 145/87 | HR 82 | Temp 98.5°F | Resp 18 | Ht 66.0 in | Wt 156.7 lb

## 2017-02-28 DIAGNOSIS — E291 Testicular hypofunction: Secondary | ICD-10-CM | POA: Diagnosis not present

## 2017-02-28 DIAGNOSIS — C61 Malignant neoplasm of prostate: Secondary | ICD-10-CM | POA: Diagnosis not present

## 2017-02-28 DIAGNOSIS — Z5111 Encounter for antineoplastic chemotherapy: Secondary | ICD-10-CM | POA: Insufficient documentation

## 2017-02-28 DIAGNOSIS — K632 Fistula of intestine: Secondary | ICD-10-CM

## 2017-02-28 LAB — COMPREHENSIVE METABOLIC PANEL
ALK PHOS: 83 U/L (ref 40–150)
ALT: 15 U/L (ref 0–55)
ANION GAP: 7 (ref 3–11)
AST: 20 U/L (ref 5–34)
Albumin: 3.7 g/dL (ref 3.5–5.0)
BILIRUBIN TOTAL: 0.4 mg/dL (ref 0.2–1.2)
BUN: 17 mg/dL (ref 7–26)
CALCIUM: 9.5 mg/dL (ref 8.4–10.4)
CO2: 27 mmol/L (ref 22–29)
Chloride: 108 mmol/L (ref 98–109)
Creatinine, Ser: 1.27 mg/dL (ref 0.70–1.30)
GFR calc non Af Amer: 54 mL/min — ABNORMAL LOW (ref >60)
Glucose, Bld: 83 mg/dL (ref 70–140)
Potassium: 5.2 mmol/L — ABNORMAL HIGH (ref 3.5–5.1)
Sodium: 142 mmol/L (ref 136–145)
TOTAL PROTEIN: 6.7 g/dL (ref 6.4–8.3)

## 2017-02-28 LAB — CBC WITH DIFFERENTIAL/PLATELET
BASOS ABS: 0 10*3/uL (ref 0.0–0.1)
BASOS PCT: 1 %
Eosinophils Absolute: 0.4 10*3/uL (ref 0.0–0.5)
Eosinophils Relative: 9 %
HEMATOCRIT: 38.8 % (ref 38.4–49.9)
HEMOGLOBIN: 12.8 g/dL — AB (ref 13.0–17.1)
Lymphocytes Relative: 32 %
Lymphs Abs: 1.5 10*3/uL (ref 0.9–3.3)
MCH: 29 pg (ref 27.2–33.4)
MCHC: 33 g/dL (ref 32.0–36.0)
MCV: 87.9 fL (ref 79.3–98.0)
MONO ABS: 0.8 10*3/uL (ref 0.1–0.9)
Monocytes Relative: 16 %
NEUTROS ABS: 2 10*3/uL (ref 1.5–6.5)
NEUTROS PCT: 42 %
Platelets: 227 10*3/uL (ref 140–400)
RBC: 4.41 MIL/uL (ref 4.20–5.82)
RDW: 14.6 % (ref 11.0–14.6)
WBC: 4.8 10*3/uL (ref 4.0–10.3)

## 2017-02-28 MED ORDER — LEUPROLIDE ACETATE (4 MONTH) 30 MG IM KIT
30.0000 mg | PACK | Freq: Once | INTRAMUSCULAR | Status: AC
  Administered 2017-02-28: 30 mg via INTRAMUSCULAR
  Filled 2017-02-28: qty 30

## 2017-02-28 NOTE — Telephone Encounter (Signed)
Printed avs and calender of upcoming appointment. Per 2/22 los 

## 2017-02-28 NOTE — Progress Notes (Signed)
Hematology and Oncology Follow Up Visit  Jesse Mann [id number] 08/17/1940 77 y.o. 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  He was initially diagnosed in December 2016. He had a Gleason score 5+4 = 9 and a PSA of 12.22.    Prior Therapy:  He is status post radical prostatectomy done on 03/03/2015 utilizing a nerve sparing approach. 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 Tumor involves bilateral seminal vesicles with margins involved with cancer. 2 lymph nodes were involved for a total of 8 sampled.  He developed biochemical relapse in July 2017 with a PSA of 1.28.  Current therapy: Lupron 30 mg injection given every 4 months first dose given on 08/08/2015 to complete at least 2 years of therapy.  Interim History: Jesse Mann presents today for a follow-up visit.  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  He is scheduled to have surgery by Dr. Marcello Mann in the near future.  He denies any complications related to Lupron.  He denies any hot flashes, weight gain or major changes in his performance status.  He does report arthritis but no pathological fractures or bone pain.  He does not report any headaches, blurry vision, syncope or seizures. He does not report any fevers or chills or sweats. He does not report any cough, wheezing or hemoptysis. He does not report any nausea, vomiting or abdominal pain. He does not report any frequency urgency or hesitancy. He does report incontinence and has reported feces in the urine.  He does not report any anxiety or depression.  Remaining review of systems is negative.  Medications: I have reviewed the patient's current medications.  Current Outpatient Medications  Medication Sig Dispense Refill  . amiodarone  (PACERONE) 200 MG tablet Take 200 mg daily by mouth.    Marland Kitchen aspirin 81 MG tablet Take 81 mg by mouth daily.    . carvedilol (COREG) 12.5 MG tablet Take 6.25 mg 3 (three) times daily by mouth.    . famotidine (PEPCID) 40 MG tablet Take 40 mg at bedtime by mouth.    . losartan (COZAAR) 50 MG tablet Take 50 mg by mouth daily.    Marland Kitchen lovastatin (MEVACOR) 40 MG tablet Take 40 mg at bedtime by mouth.    . nitrofurantoin, macrocrystal-monohydrate, (MACROBID) 100 MG capsule Take 100 mg by mouth daily.    . nitroGLYCERIN (NITROSTAT) 0.4 MG SL tablet Place 0.4 mg under the tongue every 5 (five) minutes as needed for chest pain.     . Probiotic Product (PROBIOTIC DAILY PO) Take 1 tablet by mouth daily.      No current facility-administered medications for this visit.    Facility-Administered Medications Ordered in Other Visits  Medication Dose Route Frequency Provider Last Rate Last Dose  . leuprolide (LUPRON) injection 30 mg  30 mg Intramuscular Once Jesse Mann, Jesse Dad, MD         Allergies: No Known Allergies  Past Medical History, Surgical history, Social history, and Family History were reviewed and updated.   Physical Exam: Blood pressure (!) 145/87, pulse 82, temperature 98.5 F (36.9 C), temperature source Oral, resp. rate 18, height 5\' 6"  (1.676 m), weight 156 lb 11.2 oz (71.1 kg), SpO2 99 %.   ECOG: 0 General appearance: Alert, awake gentleman without  distress. Head: Normocephalic, without obvious abnormality  Oropharynx: No oral ulcers or lesions. Eyes: No scleral icterus. Lymph nodes: Cervical, supraclavicular, and axillary nodes normal. Heart:regular rate and rhythm, S1, S2 normal, no murmur, click, rub or gallop Lung:chest clear, no wheezing, rales, normal symmetric air entry Abdomin: Soft without any tenderness.  No rebound or guarding.  Good bowel sounds auscultated. Muscular skeletal: No joint deformity or effusion. Skin: No rashes or lesions.  No ecchymosis.   Lab  Results: Lab Results  Component Value Date   WBC 4.8 02/28/2017   HGB 12.8 (L) 02/28/2017   HCT 38.8 02/28/2017   MCV 87.9 02/28/2017   PLT 227 02/28/2017     Chemistry      Component Value Date/Time   NA 140 11/27/2016 1107   K 4.4 11/27/2016 1107   CL 110 03/06/2015 0242   CO2 27 11/27/2016 1107   BUN 18.9 11/27/2016 1107   CREATININE 1.2 11/27/2016 1107      Component Value Date/Time   CALCIUM 9.0 11/27/2016 1107   ALKPHOS 72 11/27/2016 1107   AST 21 11/27/2016 1107   ALT 19 11/27/2016 1107   BILITOT 0.42 11/27/2016 1107         Impression and Plan:   77 year old man with:  1. Prostate cancer diagnosed in December 2016 with a Gleason score 4+5 = 9 and a PSA of 12. He developed biochemical relapse with elevated PSA and no measurable disease.  He is currently receiving Lupron only at 30 mg every 4 months.  The natural course of this disease was reviewed again as well as treatment options.  Intermittent versus continuous androgen deprivation was discussed today.  The plan in to continue this treatment for two years and assess at that time.  He is agreeable to proceed at this time.  2. Bone health: He is at risk of developing osteoporosis because of androgen deprivation.  Recommended calcium and vitamin D.  3.  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 Marcello Mann.  This does not appear to be related to his malignancy.  4.  Follow-up: We will be in 4 months for Lupron injection and in 8 months for follow-up and Lupron injection.  15  minutes was spent with the patient face-to-face today.  More than 50% of time was dedicated to patient counseling, education and coordination of his care including treatment options as well as long-term plan discussion.   2/22/201910:58 AM e

## 2017-02-28 NOTE — Patient Instructions (Signed)
Leuprolide depot injection What is this medicine? LEUPROLIDE (loo PROE lide) is a man-made protein that acts like a natural hormone in the body. It decreases testosterone in men and decreases estrogen in women. In men, this medicine is used to treat advanced prostate cancer. In women, some forms of this medicine may be used to treat endometriosis, uterine fibroids, or other male hormone-related problems. This medicine may be used for other purposes; ask your health care provider or pharmacist if you have questions. What should I tell my health care provider before I take this medicine? They need to know if you have any of these conditions: -diabetes -heart disease or previous heart attack -high blood pressure -high cholesterol -osteoporosis -pain or difficulty passing urine -spinal cord metastasis -stroke -tobacco smoker -unusual vaginal bleeding (women) -an unusual or allergic reaction to leuprolide, benzyl alcohol, other medicines, foods, dyes, or preservatives -pregnant or trying to get pregnant -breast-feeding How should I use this medicine? This medicine is for injection into a muscle or for injection under the skin. It is given by a health care professional in a hospital or clinic setting. The specific product will determine how it will be given to you. Make sure you understand which product you receive and how often you will receive it. Talk to your pediatrician regarding the use of this medicine in children. Special care may be needed. Overdosage: If you think you have taken too much of this medicine contact a poison control center or emergency room at once. NOTE: This medicine is only for you. Do not share this medicine with others. What if I miss a dose? It is important not to miss a dose. Call your doctor or health care professional if you are unable to keep an appointment. Depot injections: Depot injections are given either once-monthly, every 12 weeks, every 16 weeks, or  every 24 weeks depending on the product you are prescribed. The product you are prescribed will be based on if you are male or male, and your condition. Make sure you understand your product and dosing. What may interact with this medicine? Do not take this medicine with any of the following medications: -chasteberry This medicine may also interact with the following medications: -herbal or dietary supplements, like black cohosh or DHEA -male hormones, like estrogens or progestins and birth control pills, patches, rings, or injections -male hormones, like testosterone This list may not describe all possible interactions. Give your health care provider a list of all the medicines, herbs, non-prescription drugs, or dietary supplements you use. Also tell them if you smoke, drink alcohol, or use illegal drugs. Some items may interact with your medicine. What should I watch for while using this medicine? Visit your doctor or health care professional for regular checks on your progress. During the first weeks of treatment, your symptoms may get worse, but then will improve as you continue your treatment. You may get hot flashes, increased bone pain, increased difficulty passing urine, or an aggravation of nerve symptoms. Discuss these effects with your doctor or health care professional, some of them may improve with continued use of this medicine. Male patients may experience a menstrual cycle or spotting during the first months of therapy with this medicine. If this continues, contact your doctor or health care professional. What side effects may I notice from receiving this medicine? Side effects that you should report to your doctor or health care professional as soon as possible: -allergic reactions like skin rash, itching or hives, swelling of the   face, lips, or tongue -breathing problems -chest pain -depression or memory disorders -pain in your legs or groin -pain at site where injected or  implanted -severe headache -swelling of the feet and legs -visual changes -vomiting Side effects that usually do not require medical attention (report to your doctor or health care professional if they continue or are bothersome): -breast swelling or tenderness -decrease in sex drive or performance -diarrhea -hot flashes -loss of appetite -muscle, joint, or bone pains -nausea -redness or irritation at site where injected or implanted -skin problems or acne This list may not describe all possible side effects. Call your doctor for medical advice about side effects. You may report side effects to FDA at 1-800-FDA-1088. Where should I keep my medicine? This drug is given in a hospital or clinic and will not be stored at home. NOTE: This sheet is a summary. It may not cover all possible information. If you have questions about this medicine, talk to your doctor, pharmacist, or health care provider.    2016, Elsevier/Gold Standard. (2013-09-17 14:16:23)  

## 2017-03-01 LAB — PROSTATE-SPECIFIC AG, SERUM (LABCORP)

## 2017-03-10 ENCOUNTER — Ambulatory Visit: Payer: Self-pay | Admitting: General Surgery

## 2017-03-10 DIAGNOSIS — N321 Vesicointestinal fistula: Secondary | ICD-10-CM | POA: Diagnosis not present

## 2017-03-10 NOTE — H&P (Signed)
History of Present Illness Leighton Ruff MD; 01/13/5100 12:09 PM) The patient is a 77 year old male who presents with colovesical fistula. 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 Patient has a history of prostatectomy and bilateral lymphadenectomy. He also has a history of a myocardial infarction requiring 6 weeks of intubation and hypothermia protocol at [facility] previously this year. He now has an AICD. He developed signs and symptoms of urinary tract infection in Nov and began antibiotics for this. His symptoms did not resolve and he was sent to his urologist (Dr Louis Meckel) for further evaluation. It was felt that he may have a colovesicular fistula and a CT scan showed a communication between his sigmoid colon and the dome of his bladder with a small bladder wall abscess. He is currently pain free but his urine is showing bacteriuria.   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 multiple Prostate Surgery - Removal Tonsillectomy  Diagnostic Studies History Leighton Ruff, MD; 03/12/1441 12:09 PM) Colonoscopy 5-10 years ago  Allergies Levonne Spiller, [place]; 03/10/2017 11:32 AM) Allergies Reconciled No Known Drug Allergies [03/10/2017]:  Medication History Leighton Ruff, MD; 01/11/4006 12:09 PM) Carvedilol (12.5MG  Tablet, Oral) Active. Amiodarone HCl (200MG  Tablet, Oral) Active. Lisinopril (5MG  Tablet, Oral) Active. Lovastatin (40MG  Tablet, Oral) Active. Famotidine (40MG  Tablet, Oral) Active. Aspirin (81MG  Tablet, Oral) Active. Medications Reconciled Neomycin Sulfate (500MG  Tablet, 2 (two) Oral SEE NOTE, Taken starting 03/10/2017) Active. (TAKE TWO TABLETS AT 2 PM, 3 PM, AND 10 PM THE DAY PRIOR TO  SURGERY) Flagyl (500MG  Tablet, 2 (two) Oral SEE NOTE, Taken starting 03/10/2017) Active. (Take at 2pm, 3pm, and 10pm the day prior to your colon operation)  Social History Leighton Ruff, MD; 06/13/6193 12:09 PM) Caffeine use Coffee, Tea. No alcohol use No drug use Tobacco use Former smoker.  Family History Leighton Ruff, MD; 0/09/3265 12:09 PM) Arthritis Daughter, Mother, Sister, Son. Colon Cancer Father. Heart Disease Mother.  Other Problems Leighton Ruff, MD; 01/08/4578 12:09 PM) Arthritis Back Pain Congestive Heart Failure Enlarged Prostate Gastroesophageal Reflux Disease Hemorrhoids Inguinal Hernia Myocardial infarction Prostate Cancer     Review of Systems Leighton Ruff MD; 09/15/8336 12:09 PM) General Not Present- Appetite Loss, Chills, Fatigue, Fever, Night Sweats, Weight Gain and Weight Loss. Skin Not Present- Change in Wart/Mole, Dryness, Hives, Jaundice, New Lesions, Non-Healing Wounds, Rash and Ulcer. HEENT Present- Seasonal Allergies and Wears glasses/contact lenses. Not Present- Earache, Hearing Loss, Hoarseness, Nose Bleed, Oral Ulcers, Ringing in the Ears, Sinus Pain, Sore Throat, Visual Disturbances and Yellow Eyes. Respiratory Present- Chronic Cough. Not Present- Bloody sputum, Difficulty Breathing, Snoring and Wheezing. Breast Not Present- Breast Mass, Breast Pain, Nipple Discharge and Skin Changes. Cardiovascular Not Present- Chest Pain, Difficulty Breathing Lying Down, Leg Cramps, Palpitations, Rapid Heart Rate, Shortness of Breath and Swelling of Extremities. Gastrointestinal Present- Hemorrhoids. Not Present- Abdominal Pain, Bloating, Bloody Stool, Change in Bowel Habits, Chronic diarrhea, Constipation, Difficulty Swallowing, Excessive gas, Gets full quickly at meals, Indigestion, Nausea, Rectal Pain and Vomiting. Male Genitourinary Present- Urgency and Urine Leakage. Not Present- Blood in Urine, Change in Urinary Stream, Frequency, Impotence,  Nocturia and Painful Urination. Musculoskeletal Present- Back Pain and Joint Pain. Not Present- Joint Stiffness, Muscle Pain, Muscle Weakness and Swelling of Extremities. Neurological Not Present- Decreased Memory, Fainting, Headaches, Numbness, Seizures, Tingling, Tremor,  Trouble walking and Weakness. Psychiatric Not Present- Anxiety, Bipolar, Change in Sleep Pattern, Depression, Fearful and Frequent crying. Endocrine Not Present- Cold Intolerance, Excessive Hunger, Hair Changes, Heat Intolerance and New Diabetes. Hematology Present- Persistent Infections. Not Present- Blood Thinners, Easy Bruising, Excessive bleeding, Gland problems and HIV.  Vitals Andee Poles Gerrigner CMA; 03/10/2017 11:33 AM) 03/10/2017 11:33 AM Weight: 159.25 lb Height: 66in Body Surface Area: 1.82 m Body Mass Index: 25.7 kg/m  Temp.: 98.49F(Oral)  Pulse: 85 (Regular)  BP: 140/84 (Sitting, Right Arm, Standard)      Physical Exam Leighton Ruff MD; 07/16/8919 12:09 PM)  General Mental Status-Alert. General Appearance-Not in acute distress. Build & Nutrition-Well nourished. Posture-Normal posture. Gait-Normal.  Head and Neck Head-normocephalic, atraumatic with no lesions or palpable masses. Trachea-midline.  Chest and Lung Exam Chest and lung exam reveals -on auscultation, normal breath sounds, no adventitious sounds and normal vocal resonance.  Cardiovascular Cardiovascular examination reveals -normal heart sounds, regular rate and rhythm with no murmurs and no digital clubbing, cyanosis, edema, increased warmth or tenderness.  Abdomen Inspection Inspection of the abdomen reveals - No Hernias. Incisional scars - Laparoscopy scars. Palpation/Percussion Palpation and Percussion of the abdomen reveal - Soft, Non Tender, No Rigidity (guarding), No hepatosplenomegaly and No Palpable abdominal masses.  Neurologic Neurologic evaluation reveals -alert and oriented x 3 with no  impairment of recent or remote memory, normal attention span and ability to concentrate, normal sensation and normal coordination.  Musculoskeletal Normal Exam - Bilateral-Upper Extremity Strength Normal and Lower Extremity Strength Normal.    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 He continues to have pneumaturia and bacteria. He is underwent cardiac clearance and a screening colonoscopy. He is ready to schedule surgery. He is aware that there is increased risk due to his previous pelvic surgery as well as his cardiac disease. We discussed other options which include diverting colostomy and continued antibiotic therapy. Neither of these are good options for him and he wishes to proceed with definitive surgery. I will have Dr. Louis Meckel inject the ureters with firefly prior to the procedure to help identify these during surgery. We may also need urology to help close his bladder during the surgery. The surgery and anatomy were described to the patient as well as the risks of surgery and the possible complications. These include: Bleeding, deep abdominal infections and possible wound complications such as hernia and infection, damage to adjacent structures, leak of surgical connections, which can lead to other surgeries and possibly an ostomy, possible need for other procedures, such as abscess drains in radiology, possible prolonged hospital stay, possible diarrhea from removal of part of the colon, possible constipation from narcotics, possible bowel, bladder or sexual dysfunction if having rectal surgery, prolonged fatigue/weakness or appetite loss, possible early recurrence of of disease, possible complications of their medical problems such as heart disease or arrhythmias or lung problems, death (less than 1%). I believe the patient understands and  wishes to proceed with the surgery.

## 2017-03-12 ENCOUNTER — Other Ambulatory Visit: Payer: Self-pay | Admitting: Urology

## 2017-04-04 ENCOUNTER — Encounter (HOSPITAL_COMMUNITY): Payer: Self-pay

## 2017-04-04 NOTE — Pre-Procedure Instructions (Signed)
In chart: Last office visit note Strag PA ECHO 07/26/2016 Cath 08/20/2016

## 2017-04-04 NOTE — Patient Instructions (Signed)
Your procedure is scheduled on: Wednesday, April 16, 2017   Surgery Time:  8:30AM-3:00PM   Report to [place]  Entrance    Report to admitting at 6:30 AM   Call this number if you have problems the morning of surgery [phone]   Do not eat food or drink liquids :After Midnight.   Do NOT smoke after Midnight   Complete one Ensure drink the morning of surgery by       the day of surgery.   Drink 2 Ensure drinks the night before surgery.  Complete one Ensure drink the morning of surgery 3 hours prior to scheduled surgery.   Take these medicines the morning of surgery with A SIP OF WATER: Amiodarone, Carvedilol                               You may not have any metal on your body including  jewelry, and body piercings             Do not wear  lotions, powders, perfumes/cologne, or deodorant                        Men may shave face and neck.   Do not bring valuables to the hospital. [place].   Contacts, dentures or bridgework may not be worn into surgery.   Leave suitcase in the car. After surgery it may be brought to your room.   Special Instructions: Bring a copy of your healthcare power of attorney and living will documents         the day of surgery if you haven't scanned them in before.              Please read over the following fact sheets you were given:  [facility] - Preparing for Surgery Before surgery, you can play an important role.  Because skin is not sterile, your skin needs to be as free of germs as possible.  You can reduce the number of germs on your skin by washing with CHG (chlorahexidine gluconate) soap before surgery.  CHG is an antiseptic cleaner which kills germs and bonds with the skin to continue killing germs even after washing. Please DO NOT use if you have an allergy to CHG or antibacterial soaps.  If your skin becomes reddened/irritated stop using the CHG and inform your nurse  when you arrive at Short Stay. Do not shave (including legs and underarms) for at least 48 hours prior to the first CHG shower.  You may shave your face/neck.  Please follow these instructions carefully:  1.  Shower with CHG Soap the night before surgery and the  morning of surgery.  2.  If you choose to wash your hair, wash your hair first as usual with your normal  shampoo.  3.  After you shampoo, rinse your hair and body thoroughly to remove the shampoo.                             4.  Use CHG as you would any other liquid soap.  You can apply chg directly to the skin and wash.  Gently with a scrungie or clean washcloth.  5.  Apply the CHG Soap to  your body ONLY FROM THE NECK DOWN.   Do   not use on face/ open                           Wound or open sores. Avoid contact with eyes, ears mouth and   genitals (private parts).                       Wash face,  Genitals (private parts) with your normal soap.             6.  Wash thoroughly, paying special attention to the area where your    surgery  will be performed.  7.  Thoroughly rinse your body with warm water from the neck down.  8.  DO NOT shower/wash with your normal soap after using and rinsing off the CHG Soap.                9.  Pat yourself dry with a clean towel.            10.  Wear clean pajamas.            11.  Place clean sheets on your bed the night of your first shower and do not  sleep with pets. Day of Surgery : Do not apply any lotions/deodorants the morning of surgery.  Please wear clean clothes to the hospital/surgery center.  FAILURE TO FOLLOW THESE INSTRUCTIONS MAY RESULT IN THE CANCELLATION OF YOUR SURGERY  PATIENT SIGNATURE_________________________________  NURSE SIGNATURE__________________________________  ________________________________________________________________________   Jesse Mann  An incentive spirometer is a tool that can help keep your lungs clear and active. This tool measures how well  you are filling your lungs with each breath. Taking long deep breaths may help reverse or decrease the chance of developing breathing (pulmonary) problems (especially infection) following:  A long period of time when you are unable to move or be active. BEFORE THE PROCEDURE   If the spirometer includes an indicator to show your best effort, your nurse or respiratory therapist will set it to a desired goal.  If possible, sit up straight or lean slightly forward. Try not to slouch.  Hold the incentive spirometer in an upright position. INSTRUCTIONS FOR USE  1. Sit on the edge of your bed if possible, or sit up as far as you can in bed or on a chair. 2. Hold the incentive spirometer in an upright position. 3. Breathe out normally. 4. Place the mouthpiece in your mouth and seal your lips tightly around it. 5. Breathe in slowly and as deeply as possible, raising the piston or the ball toward the top of the column. 6. Hold your breath for 3-5 seconds or for as long as possible. Allow the piston or ball to fall to the bottom of the column. 7. Remove the mouthpiece from your mouth and breathe out normally. 8. Rest for a few seconds and repeat Steps 1 through 7 at least 10 times every 1-2 hours when you are awake. Take your time and take a few normal breaths between deep breaths. 9. The spirometer may include an indicator to show your best effort. Use the indicator as a goal to work toward during each repetition. 10. After each set of 10 deep breaths, practice coughing to be sure your lungs are clear. If you have an incision (the cut made at the time of surgery), support your incision when coughing by placing a  pillow or rolled up towels firmly against it. Once you are able to get out of bed, walk around indoors and cough well. You may stop using the incentive spirometer when instructed by your caregiver.  RISKS AND COMPLICATIONS  Take your time so you do not get dizzy or light-headed.  If you are  in pain, you may need to take or ask for pain medication before doing incentive spirometry. It is harder to take a deep breath if you are having pain. AFTER USE  Rest and breathe slowly and easily.  It can be helpful to keep track of a log of your progress. Your caregiver can provide you with a simple table to help with this. If you are using the spirometer at home, follow these instructions: [place] IF:   You are having difficultly using the spirometer.  You have trouble using the spirometer as often as instructed.  Your pain medication is not giving enough relief while using the spirometer.  You develop fever of 100.5 F (38.1 C) or higher. SEEK IMMEDIATE MEDICAL CARE IF:   You cough up bloody sputum that had not been present before.  You develop fever of 102 F (38.9 C) or greater.  You develop worsening pain at or near the incision site. MAKE SURE YOU:   Understand these instructions.  Will watch your condition.  Will get help right away if you are not doing well or get worse. Document Released: 05/06/2006 Document Revised: 03/18/2011 Document Reviewed: 07/07/2006 Exit[employer] Patient Information 2014 ExitCare, [place].   ________________________________________________________________________  WHAT IS A BLOOD TRANSFUSION? Blood Transfusion Information  A transfusion is the replacement of blood or some of its parts. Blood is made up of multiple cells which provide different functions.  Red blood cells carry oxygen and are used for blood loss replacement.  White blood cells fight against infection.  Platelets control bleeding.  Plasma helps clot blood.  Other blood products are available for specialized needs, such as hemophilia or other clotting disorders. BEFORE THE TRANSFUSION  Who gives blood for transfusions?   Healthy volunteers who are fully evaluated to make sure their blood is safe. This is blood bank blood. Transfusion therapy is the safest it  has ever been in the practice of medicine. Before blood is taken from a donor, a complete history is taken to make sure that person has no history of diseases nor engages in risky social behavior (examples are intravenous drug use or sexual activity with multiple partners). The donor's travel history is screened to minimize risk of transmitting infections, such as malaria. The donated blood is tested for signs of infectious diseases, such as HIV and hepatitis. The blood is then tested to be sure it is compatible with you in order to minimize the chance of a transfusion reaction. If you or a relative donates blood, this is often done in anticipation of surgery and is not appropriate for emergency situations. It takes many days to process the donated blood. RISKS AND COMPLICATIONS Although transfusion therapy is very safe and saves many lives, the main dangers of transfusion include:   Getting an infectious disease.  Developing a transfusion reaction. This is an allergic reaction to something in the blood you were given. Every precaution is taken to prevent this. The decision to have a blood transfusion has been considered carefully by your caregiver before blood is given. Blood is not given unless the benefits outweigh the risks. AFTER THE TRANSFUSION  Right after receiving a blood transfusion, you  will usually feel much better and more energetic. This is especially true if your red blood cells have gotten low (anemic). The transfusion raises the level of the red blood cells which carry oxygen, and this usually causes an energy increase.  The nurse administering the transfusion will monitor you carefully for complications. HOME CARE INSTRUCTIONS  No special instructions are needed after a transfusion. You may find your energy is better. Speak with your caregiver about any limitations on activity for underlying diseases you may have. SEEK MEDICAL CARE IF:   Your condition is not improving after your  transfusion.  You develop redness or irritation at the intravenous (IV) site. SEEK IMMEDIATE MEDICAL CARE IF:  Any of the following symptoms occur over the next 12 hours:  Shaking chills.  You have a temperature by mouth above 102 F (38.9 C), not controlled by medicine.  Chest, back, or muscle pain.  People around you feel you are not acting correctly or are confused.  Shortness of breath or difficulty breathing.  Dizziness and fainting.  You get a rash or develop hives.  You have a decrease in urine output.  Your urine turns a dark color or changes to pink, red, or brown. Any of the following symptoms occur over the next 10 days:  You have a temperature by mouth above 102 F (38.9 C), not controlled by medicine.  Shortness of breath.  Weakness after normal activity.  The white part of the eye turns yellow (jaundice).  You have a decrease in the amount of urine or are urinating less often.  Your urine turns a dark color or changes to pink, red, or brown. Document Released: 12/22/1999 Document Revised: 03/18/2011 Document Reviewed: 08/10/2007 [facility] Patient Information 2014 [place], [place].  _______________________________________________________________________

## 2017-04-08 ENCOUNTER — Encounter (HOSPITAL_COMMUNITY)
Admission: RE | Admit: 2017-04-08 | Discharge: 2017-04-08 | Disposition: A | Discharge status: Home / Self Care | Payer: Medicare HMO | Source: Ambulatory Visit | Attending: General Surgery | Admitting: General Surgery

## 2017-04-08 ENCOUNTER — Other Ambulatory Visit: Payer: Self-pay

## 2017-04-08 ENCOUNTER — Encounter (HOSPITAL_COMMUNITY): Payer: Self-pay

## 2017-04-08 DIAGNOSIS — R9431 Abnormal electrocardiogram [ECG] [EKG]: Secondary | ICD-10-CM | POA: Diagnosis not present

## 2017-04-08 DIAGNOSIS — Z01812 Encounter for preprocedural laboratory examination: Secondary | ICD-10-CM | POA: Insufficient documentation

## 2017-04-08 DIAGNOSIS — Z01810 Encounter for preprocedural cardiovascular examination: Secondary | ICD-10-CM | POA: Diagnosis not present

## 2017-04-08 DIAGNOSIS — K632 Fistula of intestine: Secondary | ICD-10-CM | POA: Insufficient documentation

## 2017-04-08 HISTORY — DX: Personal history of other diseases of the circulatory system: Z86.79

## 2017-04-08 HISTORY — DX: Ventricular premature depolarization: I49.3

## 2017-04-08 HISTORY — DX: Cardiac arrest, cause unspecified: I46.9

## 2017-04-08 HISTORY — DX: Unspecified right bundle-branch block: I45.10

## 2017-04-08 HISTORY — DX: Pneumonia, unspecified organism: J18.9

## 2017-04-08 HISTORY — DX: Heart failure, unspecified: I50.9

## 2017-04-08 HISTORY — DX: Atherosclerotic heart disease of native coronary artery without angina pectoris: I25.10

## 2017-04-08 HISTORY — DX: Cardiomyopathy, unspecified: I42.9

## 2017-04-08 HISTORY — DX: Other specified postprocedural states: Z98.890

## 2017-04-08 HISTORY — DX: Flail chest, initial encounter for closed fracture: S22.5XXA

## 2017-04-08 HISTORY — DX: Pure hypercholesterolemia, unspecified: E78.00

## 2017-04-08 HISTORY — DX: Left anterior fascicular block: I44.4

## 2017-04-08 HISTORY — DX: Bifascicular block: I45.2

## 2017-04-08 LAB — CBC
HEMATOCRIT: 41.3 % (ref 39.0–52.0)
HEMOGLOBIN: 13.2 g/dL (ref 13.0–17.0)
MCH: 29.1 pg (ref 26.0–34.0)
MCHC: 32 g/dL (ref 30.0–36.0)
MCV: 91 fL (ref 78.0–100.0)
Platelets: 219 10*3/uL (ref 150–400)
RBC: 4.54 MIL/uL (ref 4.22–5.81)
RDW: 14.3 % (ref 11.5–15.5)
WBC: 5.7 10*3/uL (ref 4.0–10.5)

## 2017-04-08 LAB — BASIC METABOLIC PANEL
ANION GAP: 6 (ref 5–15)
BUN: 24 mg/dL — ABNORMAL HIGH (ref 6–20)
CALCIUM: 9.1 mg/dL (ref 8.9–10.3)
CO2: 28 mmol/L (ref 22–32)
Chloride: 113 mmol/L — ABNORMAL HIGH (ref 101–111)
Creatinine, Ser: 1.21 mg/dL (ref 0.61–1.24)
GFR, EST NON AFRICAN AMERICAN: 57 mL/min — AB (ref >60)
GLUCOSE: 95 mg/dL (ref 65–99)
POTASSIUM: 4.7 mmol/L (ref 3.5–5.1)
Sodium: 147 mmol/L — ABNORMAL HIGH (ref 135–145)

## 2017-04-08 NOTE — Pre-Procedure Instructions (Signed)
BMP results 04/08/2017 faxed to Dr. Marcello Moores via epic.

## 2017-04-08 NOTE — Consult Note (Signed)
[place] Nurse requested for preoperative stoma site marking  Discussed surgical procedure and stoma creation with patient and family.  Explained role of the [place] nurse team.  Provided the patient with educational booklet and provided samples of pouching options.  Answered patient and family questions.   Examined patient lying, sitting, and standing in order to place the marking in the patient's visual field, away from any creases or abdominal contour issues and within the rectus muscle.  Attempted to mark below the patient's belt line.   Marked for colostomy in the LUQ  __6.5__ cm to the left of the umbilicus and __2.3__NT above the umbilicus.  Patient's abdomen cleansed with CHG wipes at site markings, allowed to air dry prior to marking.Covered mark with thin film transparent dressing to preserve mark until date of surgery.   [place] Nurse team will follow up with patient after surgery for continue ostomy care and teaching.  Thank you for the consult.   Val Riles, RN, MSN, CWOCN, CNS-BC, pager [phone]

## 2017-04-09 ENCOUNTER — Encounter (HOSPITAL_COMMUNITY): Payer: Self-pay

## 2017-04-15 MED ORDER — BUPIVACAINE LIPOSOME 1.3 % IJ SUSP
20.0000 mL | INTRAMUSCULAR | Status: DC
  Filled 2017-04-15: qty 20

## 2017-04-15 NOTE — Anesthesia Preprocedure Evaluation (Addendum)
Anesthesia Evaluation  Patient identified by MRN, date of birth, ID band Patient awake    Reviewed: Allergy & Precautions, H&P , NPO status , Patient's Chart, lab work & pertinent test results, reviewed documented beta blocker date and time   History of Anesthesia Complications Negative for: history of anesthetic complications  Airway Mallampati: II  TM Distance: >3 FB Neck ROM: full    Dental no notable dental hx. 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 Otherwise non-obstructive coronary artery disease. No restenosis of the Circumflex stent. Mild global LV systolic dysfunction. LV ejection fraction is 45 % Diagnostic Procedure Recommendations Medical therapy for CAD, LV dysfunction  Echo 07/26/16: Ejection fraction is visually estimated at 45-50% Mild global LV hypokinesis. Mild aortic regurgitation. LV function has significantly improved vs. prior study       Neuro/Psych negative neurological ROS  negative psych ROS   GI/Hepatic Neg liver ROS, GERD  ,  Endo/Other  negative endocrine ROS  Renal/GU      Musculoskeletal   Abdominal   Peds  Hematology   Anesthesia Other Findings   Myocardial infarction 7-14-  stents x2 placed; No anticoagulants Recent myocardial perfusion test shows EF of 21%; Pt clinically does not demonstrate this degree of impaired LV fxn. He mows grass and rakes leaves and walks 15" on tredamill at reasonable pace. Repeat LV asesment w/ Echo shows EF of 37% and 2+ MR/ 1+AI    Cardiac arrhythmia.... No sx w/ BBlocker        Reproductive/Obstetrics                            Anesthesia Physical  Anesthesia Plan  ASA: III  Anesthesia Plan: General   Post-op Pain Management:    Induction: Intravenous  PONV Risk Score and Plan: 3 and Ondansetron, Dexamethasone and Diphenhydramine  Airway Management Planned: Oral ETT  Additional Equipment: Arterial line  Intra-op Plan:   Post-operative Plan: Post-operative intubation/ventilation  Informed Consent: I have reviewed the patients History and Physical, chart, labs and discussed the procedure including the risks, benefits and alternatives for the proposed anesthesia with the patient or authorized representative who has indicated his/her understanding and acceptance.   Dental advisory given  Plan Discussed with: CRNA, Anesthesiologist and Surgeon  Anesthesia Plan Comments: (   )      Anesthesia Quick Evaluation

## 2017-04-16 ENCOUNTER — Inpatient Hospital Stay (HOSPITAL_COMMUNITY): Payer: Medicare HMO | Admitting: Anesthesiology

## 2017-04-16 ENCOUNTER — Other Ambulatory Visit: Payer: Self-pay

## 2017-04-16 ENCOUNTER — Inpatient Hospital Stay (HOSPITAL_COMMUNITY)
Admission: RE | Admit: 2017-04-16 | Discharge: 2017-04-18 | DRG: 982 | Disposition: A | Discharge status: Home / Self Care | Payer: Medicare HMO | Source: Ambulatory Visit | Attending: General Surgery | Admitting: General Surgery

## 2017-04-16 ENCOUNTER — Encounter (HOSPITAL_COMMUNITY)
Admission: RE | Disposition: A | Discharge status: Home / Self Care | Payer: Self-pay | Source: Ambulatory Visit | Attending: General Surgery

## 2017-04-16 ENCOUNTER — Encounter (HOSPITAL_COMMUNITY): Payer: Self-pay | Admitting: *Deleted

## 2017-04-16 DIAGNOSIS — Z8546 Personal history of malignant neoplasm of prostate: Secondary | ICD-10-CM

## 2017-04-16 DIAGNOSIS — K632 Fistula of intestine: Secondary | ICD-10-CM | POA: Diagnosis not present

## 2017-04-16 DIAGNOSIS — Z408 Encounter for other prophylactic surgery: Secondary | ICD-10-CM | POA: Diagnosis not present

## 2017-04-16 DIAGNOSIS — G4730 Sleep apnea, unspecified: Secondary | ICD-10-CM | POA: Diagnosis present

## 2017-04-16 DIAGNOSIS — Z79899 Other long term (current) drug therapy: Secondary | ICD-10-CM | POA: Diagnosis not present

## 2017-04-16 DIAGNOSIS — I110 Hypertensive heart disease with heart failure: Secondary | ICD-10-CM | POA: Diagnosis not present

## 2017-04-16 DIAGNOSIS — I10 Essential (primary) hypertension: Secondary | ICD-10-CM | POA: Diagnosis not present

## 2017-04-16 DIAGNOSIS — Z95810 Presence of automatic (implantable) cardiac defibrillator: Secondary | ICD-10-CM | POA: Diagnosis not present

## 2017-04-16 DIAGNOSIS — K625 Hemorrhage of anus and rectum: Secondary | ICD-10-CM | POA: Diagnosis not present

## 2017-04-16 DIAGNOSIS — N321 Vesicointestinal fistula: Secondary | ICD-10-CM | POA: Diagnosis not present

## 2017-04-16 DIAGNOSIS — I252 Old myocardial infarction: Secondary | ICD-10-CM | POA: Diagnosis not present

## 2017-04-16 DIAGNOSIS — Z7982 Long term (current) use of aspirin: Secondary | ICD-10-CM

## 2017-04-16 DIAGNOSIS — Z87891 Personal history of nicotine dependence: Secondary | ICD-10-CM | POA: Diagnosis not present

## 2017-04-16 DIAGNOSIS — K219 Gastro-esophageal reflux disease without esophagitis: Secondary | ICD-10-CM | POA: Diagnosis present

## 2017-04-16 DIAGNOSIS — Z955 Presence of coronary angioplasty implant and graft: Secondary | ICD-10-CM | POA: Diagnosis not present

## 2017-04-16 DIAGNOSIS — K5730 Diverticulosis of large intestine without perforation or abscess without bleeding: Secondary | ICD-10-CM | POA: Diagnosis not present

## 2017-04-16 DIAGNOSIS — I2510 Atherosclerotic heart disease of native coronary artery without angina pectoris: Secondary | ICD-10-CM | POA: Diagnosis present

## 2017-04-16 DIAGNOSIS — I509 Heart failure, unspecified: Secondary | ICD-10-CM | POA: Diagnosis not present

## 2017-04-16 DIAGNOSIS — K5732 Diverticulitis of large intestine without perforation or abscess without bleeding: Secondary | ICD-10-CM | POA: Diagnosis not present

## 2017-04-16 HISTORY — PX: CYSTOSCOPY W/ URETERAL STENT PLACEMENT: SHX1429

## 2017-04-16 LAB — CBC WITH DIFFERENTIAL/PLATELET
Basophils Absolute: 0 10*3/uL (ref 0.0–0.1)
Basophils Relative: 0 %
EOS ABS: 0 10*3/uL (ref 0.0–0.7)
EOS PCT: 0 %
HCT: 36.8 % — ABNORMAL LOW (ref 39.0–52.0)
Hemoglobin: 11.8 g/dL — ABNORMAL LOW (ref 13.0–17.0)
LYMPHS ABS: 0.7 10*3/uL (ref 0.7–4.0)
Lymphocytes Relative: 9 %
MCH: 28.6 pg (ref 26.0–34.0)
MCHC: 32.1 g/dL (ref 30.0–36.0)
MCV: 89.1 fL (ref 78.0–100.0)
MONOS PCT: 3 %
Monocytes Absolute: 0.2 10*3/uL (ref 0.1–1.0)
Neutro Abs: 6.8 10*3/uL (ref 1.7–7.7)
Neutrophils Relative %: 88 %
PLATELETS: 227 10*3/uL (ref 150–400)
RBC: 4.13 MIL/uL — ABNORMAL LOW (ref 4.22–5.81)
RDW: 13.9 % (ref 11.5–15.5)
WBC: 7.8 10*3/uL (ref 4.0–10.5)

## 2017-04-16 LAB — TYPE AND SCREEN
ABO/RH(D): O POS
ANTIBODY SCREEN: NEGATIVE

## 2017-04-16 LAB — APTT: APTT: 32 s (ref 24–36)

## 2017-04-16 LAB — PROTIME-INR
INR: 1.17
Prothrombin Time: 14.8 seconds (ref 11.4–15.2)

## 2017-04-16 SURGERY — COLECTOMY, PARTIAL, ROBOT-ASSISTED, LAPAROSCOPIC
Anesthesia: General | Site: Ureter

## 2017-04-16 MED ORDER — PHENYLEPHRINE HCL 10 MG/ML IJ SOLN
INTRAVENOUS | Status: DC | PRN
  Administered 2017-04-16: 25 ug/min via INTRAVENOUS

## 2017-04-16 MED ORDER — SACCHAROMYCES BOULARDII 250 MG PO CAPS
250.0000 mg | ORAL_CAPSULE | Freq: Two times a day (BID) | ORAL | Status: DC
  Administered 2017-04-16 – 2017-04-17 (×3): 250 mg via ORAL
  Filled 2017-04-16 (×3): qty 1

## 2017-04-16 MED ORDER — ONDANSETRON HCL 4 MG/2ML IJ SOLN: 4.0000 mg | Freq: Four times a day (QID) | INTRAMUSCULAR | Status: DC | PRN

## 2017-04-16 MED ORDER — EPHEDRINE SULFATE-NACL 50-0.9 MG/10ML-% IV SOSY
PREFILLED_SYRINGE | INTRAVENOUS | Status: DC | PRN
  Administered 2017-04-16: 5 mg via INTRAVENOUS
  Administered 2017-04-16: 10 mg via INTRAVENOUS
  Administered 2017-04-16: 5 mg via INTRAVENOUS

## 2017-04-16 MED ORDER — PROPOFOL 10 MG/ML IV BOLUS
INTRAVENOUS | Status: AC
  Filled 2017-04-16: qty 20

## 2017-04-16 MED ORDER — DIPHENHYDRAMINE HCL 12.5 MG/5ML PO ELIX: 12.5000 mg | ORAL_SOLUTION | Freq: Four times a day (QID) | ORAL | Status: DC | PRN

## 2017-04-16 MED ORDER — MIDAZOLAM HCL 2 MG/2ML IJ SOLN
INTRAMUSCULAR | Status: AC
  Filled 2017-04-16: qty 2

## 2017-04-16 MED ORDER — AMIODARONE HCL 200 MG PO TABS
200.0000 mg | ORAL_TABLET | Freq: Every day | ORAL | Status: DC
  Administered 2017-04-17: 200 mg via ORAL
  Filled 2017-04-16 (×2): qty 1

## 2017-04-16 MED ORDER — BUPIVACAINE LIPOSOME 1.3 % IJ SUSP
INTRAMUSCULAR | Status: DC | PRN
  Administered 2017-04-16: 20 mL

## 2017-04-16 MED ORDER — ROCURONIUM BROMIDE 10 MG/ML (PF) SYRINGE
PREFILLED_SYRINGE | INTRAVENOUS | Status: AC
  Filled 2017-04-16: qty 5

## 2017-04-16 MED ORDER — CHLORHEXIDINE GLUCONATE CLOTH 2 % EX PADS: 6.0000 | MEDICATED_PAD | Freq: Once | CUTANEOUS | Status: DC

## 2017-04-16 MED ORDER — CARVEDILOL 6.25 MG PO TABS
6.2500 mg | ORAL_TABLET | Freq: Three times a day (TID) | ORAL | Status: DC
  Administered 2017-04-16 – 2017-04-17 (×5): 6.25 mg via ORAL
  Filled 2017-04-16 (×5): qty 1

## 2017-04-16 MED ORDER — ENOXAPARIN SODIUM 40 MG/0.4ML ~~LOC~~ SOLN: 40.0000 mg | SUBCUTANEOUS | Status: DC

## 2017-04-16 MED ORDER — PRAVASTATIN SODIUM 20 MG PO TABS
40.0000 mg | ORAL_TABLET | Freq: Every day | ORAL | Status: DC
  Administered 2017-04-16 – 2017-04-17 (×2): 40 mg via ORAL
  Filled 2017-04-16 (×2): qty 2

## 2017-04-16 MED ORDER — ONDANSETRON HCL 4 MG/2ML IJ SOLN
INTRAMUSCULAR | Status: DC | PRN
  Administered 2017-04-16: 4 mg via INTRAVENOUS

## 2017-04-16 MED ORDER — ROCURONIUM BROMIDE 10 MG/ML (PF) SYRINGE
PREFILLED_SYRINGE | INTRAVENOUS | Status: DC | PRN
  Administered 2017-04-16: 100 mg via INTRAVENOUS
  Administered 2017-04-16: 10 mg via INTRAVENOUS

## 2017-04-16 MED ORDER — DIPHENHYDRAMINE HCL 50 MG/ML IJ SOLN
INTRAMUSCULAR | Status: AC
  Filled 2017-04-16: qty 1

## 2017-04-16 MED ORDER — KCL IN DEXTROSE-NACL 20-5-0.45 MEQ/L-%-% IV SOLN
INTRAVENOUS | Status: DC
  Administered 2017-04-16: 18:00:00 via INTRAVENOUS
  Administered 2017-04-17: 75 mL/h via INTRAVENOUS
  Filled 2017-04-16 (×2): qty 1000

## 2017-04-16 MED ORDER — PROPOFOL 10 MG/ML IV BOLUS
INTRAVENOUS | Status: DC | PRN
  Administered 2017-04-16: 30 mg via INTRAVENOUS
  Administered 2017-04-16: 120 mg via INTRAVENOUS

## 2017-04-16 MED ORDER — NITROGLYCERIN 0.4 MG SL SUBL: 0.4000 mg | SUBLINGUAL_TABLET | SUBLINGUAL | Status: DC | PRN

## 2017-04-16 MED ORDER — SUGAMMADEX SODIUM 200 MG/2ML IV SOLN
INTRAVENOUS | Status: AC
  Filled 2017-04-16: qty 2

## 2017-04-16 MED ORDER — ACETAMINOPHEN 10 MG/ML IV SOLN
INTRAVENOUS | Status: AC
  Filled 2017-04-16: qty 100

## 2017-04-16 MED ORDER — INDOCYANINE GREEN 25 MG IV SOLR
INTRAVENOUS | Status: DC | PRN
  Administered 2017-04-16: 2.5 mg via INTRAVENOUS

## 2017-04-16 MED ORDER — FENTANYL CITRATE (PF) 100 MCG/2ML IJ SOLN: 25.0000 ug | INTRAMUSCULAR | Status: DC | PRN

## 2017-04-16 MED ORDER — TRAMADOL HCL 50 MG PO TABS: 50.0000 mg | ORAL_TABLET | Freq: Four times a day (QID) | ORAL | Status: DC | PRN

## 2017-04-16 MED ORDER — SUGAMMADEX SODIUM 200 MG/2ML IV SOLN
INTRAVENOUS | Status: DC | PRN
  Administered 2017-04-16: 150 mg via INTRAVENOUS

## 2017-04-16 MED ORDER — STERILE WATER FOR INJECTION IJ SOLN
INTRAMUSCULAR | Status: AC
  Filled 2017-04-16: qty 10

## 2017-04-16 MED ORDER — KETAMINE HCL 10 MG/ML IJ SOLN
INTRAMUSCULAR | Status: DC | PRN
  Administered 2017-04-16: 40 mg via INTRAVENOUS

## 2017-04-16 MED ORDER — BUPIVACAINE-EPINEPHRINE (PF) 0.5% -1:200000 IJ SOLN
INTRAMUSCULAR | Status: AC
  Filled 2017-04-16: qty 30

## 2017-04-16 MED ORDER — METHYLENE BLUE 0.5 % INJ SOLN
INTRAVENOUS | Status: AC
  Filled 2017-04-16: qty 10

## 2017-04-16 MED ORDER — CEFOTETAN DISODIUM-DEXTROSE 2-2.08 GM-%(50ML) IV SOLR
2.0000 g | INTRAVENOUS | Status: AC
  Administered 2017-04-16: 2 g via INTRAVENOUS
  Filled 2017-04-16: qty 50

## 2017-04-16 MED ORDER — SODIUM CHLORIDE 0.9 % IV SOLN
2.0000 g | Freq: Two times a day (BID) | INTRAVENOUS | Status: DC
  Filled 2017-04-16: qty 2

## 2017-04-16 MED ORDER — ONDANSETRON HCL 4 MG/2ML IJ SOLN
INTRAMUSCULAR | Status: AC
  Filled 2017-04-16: qty 2

## 2017-04-16 MED ORDER — FAMOTIDINE 20 MG PO TABS
40.0000 mg | ORAL_TABLET | Freq: Every day | ORAL | Status: DC
  Administered 2017-04-16 – 2017-04-17 (×2): 40 mg via ORAL
  Filled 2017-04-16 (×2): qty 2

## 2017-04-16 MED ORDER — DIPHENHYDRAMINE HCL 50 MG/ML IJ SOLN
INTRAMUSCULAR | Status: DC | PRN
  Administered 2017-04-16: 6.25 mg via INTRAVENOUS

## 2017-04-16 MED ORDER — ALUM & MAG HYDROXIDE-SIMETH 200-200-20 MG/5ML PO SUSP: 30.0000 mL | Freq: Four times a day (QID) | ORAL | Status: DC | PRN

## 2017-04-16 MED ORDER — PHENYLEPHRINE HCL 10 MG/ML IJ SOLN
INTRAMUSCULAR | Status: AC
  Filled 2017-04-16: qty 2

## 2017-04-16 MED ORDER — ONDANSETRON HCL 4 MG PO TABS: 4.0000 mg | ORAL_TABLET | Freq: Four times a day (QID) | ORAL | Status: DC | PRN

## 2017-04-16 MED ORDER — ALBUMIN HUMAN 5 % IV SOLN
INTRAVENOUS | Status: AC
  Filled 2017-04-16: qty 250

## 2017-04-16 MED ORDER — DIPHENHYDRAMINE HCL 50 MG/ML IJ SOLN: 12.5000 mg | Freq: Four times a day (QID) | INTRAMUSCULAR | Status: DC | PRN

## 2017-04-16 MED ORDER — LIDOCAINE 2% (20 MG/ML) 5 ML SYRINGE
INTRAMUSCULAR | Status: DC | PRN
  Administered 2017-04-16: 1 mg/kg/h via INTRAVENOUS

## 2017-04-16 MED ORDER — MIDAZOLAM HCL 5 MG/5ML IJ SOLN
INTRAMUSCULAR | Status: DC | PRN
  Administered 2017-04-16 (×2): 1 mg via INTRAVENOUS

## 2017-04-16 MED ORDER — LOSARTAN POTASSIUM 50 MG PO TABS
50.0000 mg | ORAL_TABLET | Freq: Every day | ORAL | Status: DC
  Administered 2017-04-17: 50 mg via ORAL
  Filled 2017-04-16: qty 1

## 2017-04-16 MED ORDER — ALVIMOPAN 12 MG PO CAPS
12.0000 mg | ORAL_CAPSULE | Freq: Two times a day (BID) | ORAL | Status: DC
  Administered 2017-04-17 (×2): 12 mg via ORAL
  Filled 2017-04-16 (×2): qty 1

## 2017-04-16 MED ORDER — DEXAMETHASONE SODIUM PHOSPHATE 10 MG/ML IJ SOLN
INTRAMUSCULAR | Status: DC | PRN
  Administered 2017-04-16: 10 mg via INTRAVENOUS

## 2017-04-16 MED ORDER — LACTATED RINGERS IV SOLN
INTRAVENOUS | Status: DC
  Administered 2017-04-16: 07:00:00 via INTRAVENOUS

## 2017-04-16 MED ORDER — LACTATED RINGERS IR SOLN
Status: DC | PRN
  Administered 2017-04-16: 1000 mL

## 2017-04-16 MED ORDER — HYDROMORPHONE HCL 1 MG/ML IJ SOLN: 0.5000 mg | INTRAMUSCULAR | Status: DC | PRN

## 2017-04-16 MED ORDER — CEFOTETAN DISODIUM-DEXTROSE 2-2.08 GM-%(50ML) IV SOLR
2.0000 g | Freq: Two times a day (BID) | INTRAVENOUS | Status: AC
  Administered 2017-04-16: 2 g via INTRAVENOUS
  Filled 2017-04-16: qty 50

## 2017-04-16 MED ORDER — ACETAMINOPHEN 10 MG/ML IV SOLN
INTRAVENOUS | Status: DC | PRN
  Administered 2017-04-16: 1000 mg via INTRAVENOUS

## 2017-04-16 MED ORDER — ACETAMINOPHEN 500 MG PO TABS
1000.0000 mg | ORAL_TABLET | Freq: Four times a day (QID) | ORAL | Status: DC
  Administered 2017-04-16 – 2017-04-18 (×7): 1000 mg via ORAL
  Filled 2017-04-16 (×7): qty 2

## 2017-04-16 MED ORDER — DEXAMETHASONE SODIUM PHOSPHATE 10 MG/ML IJ SOLN
INTRAMUSCULAR | Status: AC
  Filled 2017-04-16: qty 1

## 2017-04-16 MED ORDER — ALBUMIN HUMAN 5 % IV SOLN
INTRAVENOUS | Status: DC | PRN
  Administered 2017-04-16: 11:00:00 via INTRAVENOUS

## 2017-04-16 MED ORDER — 0.9 % SODIUM CHLORIDE (POUR BTL) OPTIME
TOPICAL | Status: DC | PRN
  Administered 2017-04-16: 2000 mL

## 2017-04-16 MED ORDER — LIDOCAINE HCL 2 % IJ SOLN
INTRAMUSCULAR | Status: AC
  Filled 2017-04-16: qty 20

## 2017-04-16 MED ORDER — PROMETHAZINE HCL 25 MG/ML IJ SOLN: 6.2500 mg | INTRAMUSCULAR | Status: DC | PRN

## 2017-04-16 MED ORDER — HEPARIN SODIUM (PORCINE) 5000 UNIT/ML IJ SOLN
5000.0000 [IU] | Freq: Once | INTRAMUSCULAR | Status: AC
  Administered 2017-04-16: 5000 [IU] via SUBCUTANEOUS
  Filled 2017-04-16: qty 1

## 2017-04-16 MED ORDER — LIDOCAINE 2% (20 MG/ML) 5 ML SYRINGE
INTRAMUSCULAR | Status: AC
  Filled 2017-04-16: qty 5

## 2017-04-16 MED ORDER — KETAMINE HCL 10 MG/ML IJ SOLN
INTRAMUSCULAR | Status: AC
  Filled 2017-04-16: qty 1

## 2017-04-16 MED ORDER — ALVIMOPAN 12 MG PO CAPS
12.0000 mg | ORAL_CAPSULE | ORAL | Status: AC
  Administered 2017-04-16: 12 mg via ORAL
  Filled 2017-04-16: qty 1

## 2017-04-16 MED ORDER — FENTANYL CITRATE (PF) 100 MCG/2ML IJ SOLN
INTRAMUSCULAR | Status: DC | PRN
  Administered 2017-04-16 (×5): 50 ug via INTRAVENOUS

## 2017-04-16 MED ORDER — FENTANYL CITRATE (PF) 250 MCG/5ML IJ SOLN
INTRAMUSCULAR | Status: AC
  Filled 2017-04-16: qty 5

## 2017-04-16 MED ORDER — LACTATED RINGERS IV SOLN
INTRAVENOUS | Status: DC
  Administered 2017-04-16: 08:00:00 via INTRAVENOUS

## 2017-04-16 MED ORDER — SODIUM CHLORIDE 0.9 % IR SOLN
Status: DC | PRN
  Administered 2017-04-16: 3000 mL

## 2017-04-16 MED ORDER — LIDOCAINE 2% (20 MG/ML) 5 ML SYRINGE
INTRAMUSCULAR | Status: DC | PRN
  Administered 2017-04-16: 80 mg via INTRAVENOUS

## 2017-04-16 MED ORDER — BUPIVACAINE-EPINEPHRINE (PF) 0.5% -1:200000 IJ SOLN
INTRAMUSCULAR | Status: DC | PRN
  Administered 2017-04-16: 30 mL

## 2017-04-16 SURGICAL SUPPLY — 105 items
BAG URO CATCHER STRL LF (MISCELLANEOUS) ×4 IMPLANT
BLADE EXTENDED COATED 6.5IN (ELECTRODE) IMPLANT
CANNULA REDUC XI 12-8 STAPL (CANNULA) ×1
CANNULA REDUC XI 12-8MM STAPL (CANNULA) ×1
CANNULA REDUCER 12-8 DVNC XI (CANNULA) ×2 IMPLANT
CATH 18FR 3 WAY 30 (CATHETERS) ×2
CATH 18FR 3WAY 30CC (CATHETERS) ×2 IMPLANT
CATH URET 5FR 28IN OPEN ENDED (CATHETERS) ×4 IMPLANT
CELLS DAT CNTRL 66122 CELL SVR (MISCELLANEOUS) IMPLANT
CLIP VESOLOCK LG 6/CT PURPLE (CLIP) IMPLANT
CLIP VESOLOCK MED 6/CT (CLIP) IMPLANT
CLOTH BEACON ORANGE TIMEOUT ST (SAFETY) ×4 IMPLANT
COVER FOOTSWITCH UNIV (MISCELLANEOUS) IMPLANT
COVER SURGICAL LIGHT HANDLE (MISCELLANEOUS) ×12 IMPLANT
COVER TIP SHEARS 8 DVNC (MISCELLANEOUS) ×2 IMPLANT
COVER TIP SHEARS 8MM DA VINCI (MISCELLANEOUS) ×2
DECANTER SPIKE VIAL GLASS SM (MISCELLANEOUS) IMPLANT
DERMABOND ADVANCED (GAUZE/BANDAGES/DRESSINGS) ×2
DERMABOND ADVANCED .7 DNX12 (GAUZE/BANDAGES/DRESSINGS) ×2 IMPLANT
DRAIN CHANNEL 19F RND (DRAIN) IMPLANT
DRAPE ARM DVNC X/XI (DISPOSABLE) ×8 IMPLANT
DRAPE COLUMN DVNC XI (DISPOSABLE) ×2 IMPLANT
DRAPE DA VINCI XI ARM (DISPOSABLE) ×8
DRAPE DA VINCI XI COLUMN (DISPOSABLE) ×2
DRAPE SURG IRRIG POUCH 19X23 (DRAPES) ×4 IMPLANT
DRSG OPSITE POSTOP 4X10 (GAUZE/BANDAGES/DRESSINGS) IMPLANT
DRSG OPSITE POSTOP 4X6 (GAUZE/BANDAGES/DRESSINGS) ×4 IMPLANT
DRSG OPSITE POSTOP 4X8 (GAUZE/BANDAGES/DRESSINGS) IMPLANT
ELECT PENCIL ROCKER SW 15FT (MISCELLANEOUS) ×4 IMPLANT
ELECT REM PT RETURN 15FT ADLT (MISCELLANEOUS) ×4 IMPLANT
ENDOLOOP SUT PDS II  0 18 (SUTURE)
ENDOLOOP SUT PDS II 0 18 (SUTURE) IMPLANT
EVACUATOR SILICONE 100CC (DRAIN) IMPLANT
GAUZE SPONGE 4X4 12PLY STRL (GAUZE/BANDAGES/DRESSINGS) IMPLANT
GLOVE BIO SURGEON STRL SZ 6.5 (GLOVE) ×9 IMPLANT
GLOVE BIO SURGEONS STRL SZ 6.5 (GLOVE) ×3
GLOVE BIOGEL M STRL SZ7.5 (GLOVE) ×4 IMPLANT
GLOVE BIOGEL PI IND STRL 7.0 (GLOVE) ×6 IMPLANT
GLOVE BIOGEL PI INDICATOR 7.0 (GLOVE) ×6
GOWN STRL REUS W/TWL 2XL LVL3 (GOWN DISPOSABLE) ×12 IMPLANT
GOWN STRL REUS W/TWL LRG LVL3 (GOWN DISPOSABLE) ×8 IMPLANT
GOWN STRL REUS W/TWL XL LVL3 (GOWN DISPOSABLE) ×16 IMPLANT
GRASPER ENDOPATH ANVIL 10MM (MISCELLANEOUS) IMPLANT
GRASPER SUT TROCAR 14GX15 (MISCELLANEOUS) IMPLANT
GUIDEWIRE STR DUAL SENSOR (WIRE) ×4 IMPLANT
HOLDER FOLEY CATH W/STRAP (MISCELLANEOUS) ×4 IMPLANT
IRRIG SUCT STRYKERFLOW 2 WTIP (MISCELLANEOUS) ×4
IRRIGATION SUCT STRKRFLW 2 WTP (MISCELLANEOUS) ×2 IMPLANT
IRRIGATOR SUCT 8 DISP DVNC XI (IRRIGATION / IRRIGATOR) IMPLANT
IRRIGATOR SUCTION 8MM XI DISP (IRRIGATION / IRRIGATOR)
KIT PROCEDURE DA VINCI SI (MISCELLANEOUS) ×2
KIT PROCEDURE DVNC SI (MISCELLANEOUS) ×2 IMPLANT
MANIFOLD NEPTUNE II (INSTRUMENTS) ×4 IMPLANT
NEEDLE INSUFFLATION 14GA 120MM (NEEDLE) ×4 IMPLANT
PACK CARDIOVASCULAR III (CUSTOM PROCEDURE TRAY) ×4 IMPLANT
PACK COLON (CUSTOM PROCEDURE TRAY) ×4 IMPLANT
PACK CYSTO (CUSTOM PROCEDURE TRAY) ×4 IMPLANT
PORT LAP GEL ALEXIS MED 5-9CM (MISCELLANEOUS) IMPLANT
RTRCTR WOUND ALEXIS 18CM MED (MISCELLANEOUS)
SCISSORS LAP 5X35 DISP (ENDOMECHANICALS) ×4 IMPLANT
SEAL CANN UNIV 5-8 DVNC XI (MISCELLANEOUS) ×6 IMPLANT
SEAL XI 5MM-8MM UNIVERSAL (MISCELLANEOUS) ×6
SEALER VESSEL DA VINCI XI (MISCELLANEOUS) ×2
SEALER VESSEL EXT DVNC XI (MISCELLANEOUS) ×2 IMPLANT
SET IRRIG Y TYPE TUR BLADDER L (SET/KITS/TRAYS/PACK) ×4 IMPLANT
SLEEVE ADV FIXATION 5X100MM (TROCAR) IMPLANT
SOLUTION ELECTROLUBE (MISCELLANEOUS) ×4 IMPLANT
STAPLER 45 BLU RELOAD XI (STAPLE) IMPLANT
STAPLER 45 BLUE RELOAD XI (STAPLE)
STAPLER 45 GREEN RELOAD XI (STAPLE) ×4
STAPLER 45 GRN RELOAD XI (STAPLE) ×4 IMPLANT
STAPLER CANNULA SEAL DVNC XI (STAPLE) ×2 IMPLANT
STAPLER CANNULA SEAL XI (STAPLE) ×2
STAPLER CIRC ILS CVD 33MM 37CM (STAPLE) ×4 IMPLANT
STAPLER SHEATH (SHEATH) ×2
STAPLER SHEATH ENDOWRIST DVNC (SHEATH) ×2 IMPLANT
STAPLER VISISTAT 35W (STAPLE) IMPLANT
SUT ETHILON 2 0 PS N (SUTURE) IMPLANT
SUT NOVA NAB DX-16 0-1 5-0 T12 (SUTURE) ×8 IMPLANT
SUT PROLENE 2 0 KS (SUTURE) ×4 IMPLANT
SUT SILK 2 0 (SUTURE) ×2
SUT SILK 2 0 SH CR/8 (SUTURE) ×4 IMPLANT
SUT SILK 2-0 18XBRD TIE 12 (SUTURE) ×2 IMPLANT
SUT SILK 3 0 (SUTURE) ×2
SUT SILK 3 0 SH CR/8 (SUTURE) ×4 IMPLANT
SUT SILK 3-0 18XBRD TIE 12 (SUTURE) ×2 IMPLANT
SUT V-LOC BARB 180 2/0GR6 GS22 (SUTURE) ×4
SUT VIC AB 2-0 SH 18 (SUTURE) ×4 IMPLANT
SUT VIC AB 2-0 SH 27 (SUTURE) ×4
SUT VIC AB 2-0 SH 27X BRD (SUTURE) ×4 IMPLANT
SUT VIC AB 3-0 SH 18 (SUTURE) ×4 IMPLANT
SUT VIC AB 4-0 PS2 18 (SUTURE) ×8 IMPLANT
SUT VIC AB 4-0 PS2 27 (SUTURE) ×8 IMPLANT
SUT VICRYL 0 UR6 27IN ABS (SUTURE) ×4 IMPLANT
SUTURE V-LC BRB 180 2/0GR6GS22 (SUTURE) ×2 IMPLANT
SYR 10ML ECCENTRIC (SYRINGE) ×4 IMPLANT
SYS LAPSCP GELPORT 120MM (MISCELLANEOUS)
SYSTEM LAPSCP GELPORT 120MM (MISCELLANEOUS) IMPLANT
TOWEL OR 17X26 10 PK STRL BLUE (TOWEL DISPOSABLE) IMPLANT
TOWEL OR NON WOVEN STRL DISP B (DISPOSABLE) ×4 IMPLANT
TRAY FOLEY W/METER SILVER 16FR (SET/KITS/TRAYS/PACK) ×4 IMPLANT
TROCAR ADV FIXATION 5X100MM (TROCAR) ×4 IMPLANT
TUBING CONNECTING 10 (TUBING) ×6 IMPLANT
TUBING CONNECTING 10' (TUBING) ×2
TUBING INSUFFLATION 10FT LAP (TUBING) ×4 IMPLANT

## 2017-04-16 NOTE — H&P (Signed)
The patient is a 77 year old male who presents with colovesical fistula. 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 Patient has a history of prostatectomy and bilateral lymphadenectomy. 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 He now has an AICD. He developed signs and symptoms of urinary tract infection in Nov and began antibiotics for this. His symptoms did not resolve and he was sent to his urologist (Dr Louis Meckel) for further evaluation. It was felt that he may have a colovesicular fistula and a CT scan showed a communication between his sigmoid colon and the dome of his bladder with a small bladder wall abscess. He is currently pain free but his urine is showing bacteriuria.   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 multiple Prostate Surgery - Removal Tonsillectomy  Diagnostic Studies History Leighton Ruff, MD; 07/10/812 12:09 PM) Colonoscopy 5-10 years ago  Allergies Levonne Spiller, [place]; 03/10/2017 11:32 AM) Allergies Reconciled No Known Drug Allergies [03/10/2017]:  Medication History Leighton Ruff, MD; 04/15/1854 12:09 PM) Carvedilol (12.5MG  Tablet, Oral) Active. Amiodarone HCl (200MG  Tablet, Oral) Active. Lisinopril (5MG  Tablet, Oral) Active. Lovastatin (40MG  Tablet, Oral) Active. Famotidine (40MG  Tablet, Oral) Active. Aspirin (81MG  Tablet, Oral) Active.   Social History Leighton Ruff, MD; 03/07/4968 12:09 PM) Caffeine use Coffee, Tea. No alcohol use No drug use Tobacco use Former smoker.  Family History Leighton Ruff, MD; 02/13/3783 12:09 PM) Arthritis Daughter, Mother, Sister,  Son. Colon Cancer Father. Heart Disease Mother.  Other Problems Leighton Ruff, MD; 08/14/5025 12:09 PM) Arthritis Back Pain Congestive Heart Failure Enlarged Prostate Gastroesophageal Reflux Disease Hemorrhoids Inguinal Hernia Myocardial infarction Prostate Cancer     Review of Systems  General Not Present- Appetite Loss, Chills, Fatigue, Fever, Night Sweats, Weight Gain and Weight Loss. Skin Not Present- Change in Wart/Mole, Dryness, Hives, Jaundice, New Lesions, Non-Healing Wounds, Rash and Ulcer. HEENT Present- Seasonal Allergies and Wears glasses/contact lenses. Not Present- Earache, Hearing Loss, Hoarseness, Nose Bleed, Oral Ulcers, Ringing in the Ears, Sinus Pain, Sore Throat, Visual Disturbances and Yellow Eyes. Respiratory Present- Chronic Cough. Not Present- Bloody sputum, Difficulty Breathing, Snoring and Wheezing. Breast Not Present- Breast Mass, Breast Pain, Nipple Discharge and Skin Changes. Cardiovascular Not Present- Chest Pain, Difficulty Breathing Lying Down, Leg Cramps, Palpitations, Rapid Heart Rate, Shortness of Breath and Swelling of Extremities. Gastrointestinal Present- Hemorrhoids. Not Present- Abdominal Pain, Bloating, Bloody Stool, Change in Bowel Habits, Chronic diarrhea, Constipation, Difficulty Swallowing, Excessive gas, Gets full quickly at meals, Indigestion, Nausea, Rectal Pain and Vomiting. Male Genitourinary Present- Urgency and Urine Leakage. Not Present- Blood in Urine, Change in Urinary Stream, Frequency, Impotence, Nocturia and Painful Urination. Musculoskeletal Present- Back Pain and Joint Pain. Not Present- Joint Stiffness, Muscle Pain, Muscle Weakness and Swelling of Extremities. Neurological Not Present- Decreased Memory, Fainting, Headaches, Numbness, Seizures, Tingling, Tremor, Trouble walking and Weakness. Psychiatric Not Present- Anxiety, Bipolar, Change in Sleep Pattern, Depression, Fearful and Frequent crying. Endocrine Not  Present- Cold Intolerance, Excessive Hunger, Hair Changes, Heat Intolerance and New Diabetes. Hematology Present- Persistent Infections. Not Present- Blood Thinners, Easy Bruising, Excessive bleeding, Gland problems and HIV.  BP (!) 147/97   Pulse 77   Temp 98.2 F (36.8 C) (Oral)   Resp 18   Ht 5'  6" (1.676 m)   Wt 73 kg (161 lb)   SpO2 97%   BMI 25.99 kg/m     Physical Exam   General Mental Status-Alert. General Appearance-Not in acute distress. Build & Nutrition-Well nourished. Posture-Normal posture. Gait-Normal.  Head and Neck Head-normocephalic, atraumatic with no lesions or palpable masses. Trachea-midline.  Chest and Lung Exam Chest and lung exam reveals -on auscultation, normal breath sounds, no adventitious sounds and normal vocal resonance.  Cardiovascular Cardiovascular examination reveals -normal heart sounds, regular rate and rhythm with no murmurs and no digital clubbing, cyanosis, edema, increased warmth or tenderness.  Abdomen Inspection Inspection of the abdomen reveals - No Hernias. Incisional scars - Laparoscopy scars. Palpation/Percussion Palpation and Percussion of the abdomen reveal - Soft, Non Tender, No Rigidity (guarding), No hepatosplenomegaly and No Palpable abdominal masses.  Neurologic Neurologic evaluation reveals -alert and oriented x 3 with no impairment of recent or remote memory, normal attention span and ability to concentrate, normal sensation and normal coordination.  Musculoskeletal Normal Exam - Bilateral-Upper Extremity Strength Normal and Lower Extremity Strength Normal.    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 He continues to have pneumaturia and bacteria. He is underwent cardiac clearance and a screening colonoscopy. He is ready to schedule surgery. He is aware that there is  increased risk due to his previous pelvic surgery as well as his cardiac disease. We discussed other options which include diverting colostomy and continued antibiotic therapy. Neither of these are good options for him and he wishes to proceed with definitive surgery. I will have Dr. Louis Meckel inject the ureters with firefly prior to the procedure to help identify these during surgery. We may also need urology to help close his bladder during the surgery. The surgery and anatomy were described to the patient as well as the risks of surgery and the possible complications. These include: Bleeding, deep abdominal infections and possible wound complications such as hernia and infection, damage to adjacent structures, leak of surgical connections, which can lead to other surgeries and possibly an ostomy, possible need for other procedures, such as abscess drains in radiology, possible prolonged hospital stay, possible diarrhea from removal of part of the colon, possible constipation from narcotics, possible bowel, bladder or sexual dysfunction if having rectal surgery, prolonged fatigue/weakness or appetite loss, possible early recurrence of of disease, possible complications of their medical problems such as heart disease or arrhythmias or lung problems, death (less than 1%). I believe the patient understands and wishes to proceed with the surgery.

## 2017-04-16 NOTE — Transfer of Care (Signed)
Immediate Anesthesia Transfer of Care Note  Patient: Jesse Mann  Procedure(s) Performed: XI ROBOTIC  ASSISTED SIGMOIDECTOMY (N/A Abdomen) CYSTOSCOPY WITH STENT REPLACEMENT FIREFLY (Bilateral Ureter)  Patient Location: PACU  Anesthesia Type:General  Level of Consciousness: awake, alert , oriented and patient cooperative  Airway & Oxygen Therapy: Patient Spontanous Breathing and Patient connected to face mask oxygen  Post-op Assessment: Report given to RN, Post -op Vital signs reviewed and stable and Patient moving all extremities  Post vital signs: Reviewed and stable  Last Vitals:  Vitals Value Taken Time  BP 141/79 04/16/2017 11:55 AM  Temp    Pulse 67 04/16/2017 12:00 PM  Resp 17 04/16/2017 12:00 PM  SpO2 100 % 04/16/2017 12:00 PM  Vitals shown include unvalidated device data.  Last Pain:  Vitals:   04/16/17 0653  TempSrc: Oral      Patients Stated Pain Goal: 5 (50/09/38 1829)  Complications: No apparent anesthesia complications

## 2017-04-16 NOTE — Anesthesia Procedure Notes (Signed)
Arterial Line Insertion Start/End4/10/2017 8:01 AM, 04/16/2017 8:05 AM Performed by: Victoriano Lain, CRNA, CRNA  Preanesthetic checklist: patient identified, IV checked, site marked, risks and benefits discussed, surgical consent, monitors and equipment checked, pre-op evaluation, timeout performed and anesthesia consent Lidocaine 1% used for infiltration and patient sedated Left, radial was placed Catheter size: 20 G Hand hygiene performed  and maximum sterile barriers used  Allen's test indicative of satisfactory collateral circulation Attempts: 1 Procedure performed without using ultrasound guided technique. Following insertion, Biopatch and dressing applied. Post procedure assessment: normal  Patient tolerated the procedure well with no immediate complications.

## 2017-04-16 NOTE — H&P (Signed)
Patient with a history of prostate cancer and is s/p prostatectomy.  Subsequently had cardiac arrest and developed a colo-vesical fistula during his prolonged recovery.  He presents today for sigmoidectomy and fistula take down.  Dr Marcello Moores has asked me to perform cystoscopy and inject firefly into the ureters to help facilitate the dissection.  Discussed my role with the patient and explained to him what I plan to do.  He understands the risks/benefits of my aspect of the surgery.  Consent was obtained.

## 2017-04-16 NOTE — Op Note (Signed)
04/16/2017  11:33 AM  PATIENT:  Jesse Mann  77 y.o. male  Patient Care Team: Myer Peer, MD as PCP - General (Family Medicine)  PRE-OPERATIVE DIAGNOSIS:  COLOVESICAL FISTULA  POST-OPERATIVE DIAGNOSIS:  COLOVESICAL FISTULA  PROCEDURE:  XI ROBOTIC  ASSISTED SIGMOIDECTOMY, INTRAOPERATIVE ASSESSMENT OF PERFUSION, CLOSURE OF BLADDER FISTULA CYSTOSCOPY WITH FIREFLY INJECTION  Surgeon(s): Ardis Hughs, MD Michael Boston, MD Leighton Ruff, MD  ASSISTANT: Dr Johney Maine   ANESTHESIA:   local and general  EBL: 28ml  Total I/O In: 250 [IV Piggyback:250] Out: 380 [Urine:330; Blood:50]  Delay start of Pharmacological VTE agent (>24hrs) due to surgical blood loss or risk of bleeding:  no  DRAINS: none   SPECIMEN:  Source of Specimen:  Sigmoid colon  DISPOSITION OF SPECIMEN:  PATHOLOGY  COUNTS:  YES  PLAN OF CARE: Admit to inpatient   PATIENT DISPOSITION:  PACU - hemodynamically stable.  INDICATION:    77 y.o. M with colovesical fistula.  I recommended segmental resection:  The anatomy & physiology of the digestive tract was discussed.  The pathophysiology was discussed.  Natural history risks without surgery was discussed.   I worked to give an overview of the disease and the frequent need to have multispecialty involvement.  I feel the risks of no intervention will lead to serious problems that outweigh the operative risks; therefore, I recommended a partial colectomy to remove the pathology.  Laparoscopic & open techniques were discussed.   Risks such as bleeding, infection, abscess, leak, reoperation, possible ostomy, hernia, heart attack, death, and other risks were discussed.  I noted a good likelihood this will help address the problem.   Goals of post-operative recovery were discussed as well.    The patient expressed understanding & wished to proceed with surgery.  OR FINDINGS:   Patient had simple, non-inflammatory fistula  The anastomosis rests 14 cm  from the anal verge by rigid proctoscopy.  DESCRIPTION:   Informed consent was confirmed.  The patient underwent general anaesthesia without difficulty.  The patient was positioned appropriately.  VTE prevention in place.  The patient's abdomen was clipped, prepped, & draped in a sterile fashion.  Surgical timeout confirmed our plan.  The patient was positioned in reverse Trendelenburg.  Abdominal entry was gained using a Varies needle in the LUQ.  Entry was clean.  I induced carbon dioxide insufflation.  An 62mm robotic port was placed in the RUQ through his previous port site.  Camera inspection revealed no injury.  Extra ports were carefully placed under direct laparoscopic visualization.  I laparoscopically reflected the greater omentum and the upper abdomen the small bowel in the upper abdomen. The patient was appropriately positioned and the robot was docked to the patient's left side.  Instruments were placed under direct visualization.    I mobilized the sigmoid colon off of the pelvic sidewall.  There was an obvious fistulous connection between his sigmoid colon and bladder.  I scored the base of peritoneum of the right side of the mesentery of the left colon from the ligament of Treitz to the peritoneal reflection of the mid rectum.  I elevated the sigmoid mesentery and enetered into the retro-mesenteric plane. We were able to identify the left ureter and gonadal vessels. We kept those posterior within the retroperitoneum and elevated the left colon mesentery off that. I did isolated IMA pedicle but did not ligate it yet.  I continued distally and got into the avascular plane posterior to the mesorectum. This allowed me  to help mobilize the rectum as well by freeing the mesorectum off the sacrum.  I mobilized the peritoneal coverings towards the peritoneal reflection on both the right and left sides of the rectum.  I could see the right and left ureters and stayed away from them.    I skeletonized  the inferior mesenteric artery pedicle.  After confirming the left ureter was out of the way, I went ahead and ligated the inferior mesenteric artery pedicle with bipolar robotic vessel sealer well above its takeoff from the aorta.  We ensured hemostasis. I skeletonized the mesorectum at the junction at the proximal rectum using blunt dissection & bipolar robotic vessel sealer.  I mobilized the left colon in a lateral to medial fashion off the line of Toldt up towards the splenic flexure to ensure good mobilization of the left colon to reach into the pelvis.  At this point I closed the fistula site robotically using a 2-0 V lock suture.  The bladder was then irrigated with 200 mL of saline until tense and no leak was identified.  Identified a portion of normal-appearing colon and the proximal sigmoid.  I divided the mesentery up to this level using the robotic vessel sealer.  I did not take his entire sigmoid due to this not appearing to be related to diverticular disease.  I then divided the mesorectal fat with the robotic vessel sealer as well.  This was done at the colorectal junction.  We then introduced 5 mL of firefly intravenously.  I assess perfusion of the colon.  Perfusion continued in the colon down to the level of the fistula site.  There was also a clear demarcation at the rectosigmoid junction at my dissection site.  I then divided the rectosigmoid at this site using 2 green load robotic staplers.  I checked for hemostasis.  There is no active bleeding.  The robot was then undocked.  A small Pfannenstiel incision was made an [place] wound protector was placed inside this.  The colon was brought out through the this site and transected distal to the fistula over a pursestring clamp and 2-0 Prolene pursestring suture.  Suture was secured with 3-0 silk sutures and a 33 mm EEA anvil was placed into the colon and the pursestring suture was tied tightly around this.  This was then placed back into the  abdomen.  The abdomen was reinsufflated.  An anastomosis was created under direct laparoscopic visualization.  There was no tension.  There was no leak when tested with insufflation under water.  The abdomen was irrigated.  Hemostasis was good.  The omentum was then brought down over the small bowel and the abdomen was desufflated and the ports were removed.  We then switched to clean gowns, gloves, drapes and instruments.  The Pfannenstiel peritoneum was closed using a running 2-0 Vicryl suture.  The fascia was closed using interrupted #1 Novafil sutures.  The subcutaneous tissue was reapproximated using 2-0 Vicryl suture and the skin was closed using a 4-0 Vicryl running subcuticular suture.  A sterile dressing was applied.  The remaining port sites were closed using a 4-0 Vicryl suture and Dermabond.  The patient was then awakened from anesthesia and sent to the postanesthesia care unit stable condition.  Due to the fistula site that was closed, the patient will need to keep his Foley catheter for approximately 10 days.  An MD assistant was necessary for tissue manipulation, retraction and positioning due to the complexity of the case and hospital  policies.

## 2017-04-16 NOTE — Progress Notes (Signed)
Notified by charge nurse that patient was stating he was laying in blood. Went with charge nurse inside room to find large amount of blood on sheets. Examined patient and saw bright red blood with clots coming from rectum. CCS on call MD paged. Will continue to monitor and await orders.

## 2017-04-16 NOTE — Anesthesia Procedure Notes (Signed)

## 2017-04-16 NOTE — Anesthesia Postprocedure Evaluation (Signed)
Anesthesia Post Note  Patient: Jesse Mann  Procedure(s) Performed: XI ROBOTIC  ASSISTED SIGMOIDECTOMY (N/A Abdomen) CYSTOSCOPY WITH STENT REPLACEMENT FIREFLY (Bilateral Ureter)     Patient location during evaluation: PACU Anesthesia Type: General Level of consciousness: sedated Pain management: pain level controlled Vital Signs Assessment: post-procedure vital signs reviewed and stable Respiratory status: spontaneous breathing and respiratory function stable Cardiovascular status: stable Postop Assessment: no apparent nausea or vomiting Anesthetic complications: no    Last Vitals:  Vitals:   04/16/17 1245 04/16/17 1300  BP: (!) 155/84   Pulse: (!) 59   Resp: 14   Temp:  (!) 36.4 C  SpO2: 100%     Last Pain:  Vitals:   04/16/17 1300  TempSrc:   PainSc: 0-No pain                 Briauna Gilmartin DANIEL

## 2017-04-16 NOTE — Op Note (Signed)
Preoperative diagnosis:  1. Colovesical fistula  Postoperative diagnosis:  1. Same  Procedure: 1. Cystoscopy 2. Retrograde injection of firefight contrast into the ureters bilaterally  Surgeon: Ardis Hughs, MD  Anesthesia: General  Complications: None  Intraoperative findings: The prostate was surgically absent.  The patient's bladder had large amount of sediment within it, but was otherwise relatively healthy appearing.  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  The fistulous connection was noted to be the left lateral wall/dome.  EBL: Minimal  Specimens: None  Indication: Jesse Mann is a 78 y.o. patient with colovesical fistula.  Dr. Marcello Moores requested firefly injection to help facilitate the dissection of the sigmoid colon.  After reviewing the management options for treatment, he elected to proceed with the above surgical procedure(s). We have discussed the potential benefits and risks of the procedure, side effects of the proposed treatment, the likelihood of the patient achieving the goals of the procedure, and any potential problems that might occur during the procedure or recuperation. Informed consent has been obtained.  Description of procedure:  The patient was taken to the operating room and general anesthesia was induced.  The patient was placed in the dorsal lithotomy position, prepped and draped in the usual sterile fashion, and preoperative antibiotics were administered. A preoperative time-out was performed.   A 21 French 30 degree cystoscope was gently passed through the patient's urethra into the bladder.  The bladder was subsequently emptied and then filled slowly up performing a 360 degrees cystoscopic evaluation.  This demonstrated orthotopic ureteral orifice ease.  There were 2 small diverticulum bilaterally behind the ureteral trigone.  There was a fistula noted on the left lateral wall/dome.  I then used a 5 [place]  open-ended ureteral catheter and cannulated the patient's left ureteral orifice and instilled 7.5 cc of firefly contrast.  I then cannulated the right ureteral orifice and instilled 7.5 cc of firefight contrast into this ureter as well.  I subsequently removed the scope and passed an 68 French three-way Foley catheter into the patient's bladder.  I used 10 cc of sterile water to and inflated the balloon.  The surgery was then turned over to Dr. Marcello Moores for facilitation of the remainder of the case.  Ardis Hughs, M.D.

## 2017-04-17 LAB — BASIC METABOLIC PANEL
Anion gap: 8 (ref 5–15)
BUN: 19 mg/dL (ref 6–20)
CALCIUM: 8.6 mg/dL — AB (ref 8.9–10.3)
CO2: 23 mmol/L (ref 22–32)
CREATININE: 1.14 mg/dL (ref 0.61–1.24)
Chloride: 107 mmol/L (ref 101–111)
GFR calc Af Amer: >60 mL/min (ref >60)
GFR calc non Af Amer: >60 mL/min (ref >60)
GLUCOSE: 154 mg/dL — AB (ref 65–99)
Potassium: 4.1 mmol/L (ref 3.5–5.1)
Sodium: 138 mmol/L (ref 135–145)

## 2017-04-17 LAB — CBC
HCT: 32.5 % — ABNORMAL LOW (ref 39.0–52.0)
HEMOGLOBIN: 10.6 g/dL — AB (ref 13.0–17.0)
MCH: 28.9 pg (ref 26.0–34.0)
MCHC: 32.6 g/dL (ref 30.0–36.0)
MCV: 88.6 fL (ref 78.0–100.0)
Platelets: 197 10*3/uL (ref 150–400)
RBC: 3.67 MIL/uL — ABNORMAL LOW (ref 4.22–5.81)
RDW: 14.1 % (ref 11.5–15.5)
WBC: 7.5 10*3/uL (ref 4.0–10.5)

## 2017-04-17 MED ORDER — METOPROLOL TARTRATE 5 MG/5ML IV SOLN: 5.0000 mg | Freq: Four times a day (QID) | INTRAVENOUS | Status: DC | PRN

## 2017-04-17 MED ORDER — ENOXAPARIN SODIUM 40 MG/0.4ML ~~LOC~~ SOLN: 40.0000 mg | SUBCUTANEOUS | Status: DC

## 2017-04-17 NOTE — Progress Notes (Signed)
1 Day Post-Op Robotic Sigmoidectomy Subjective: Had some rectal bleeding overnight, HD stable.  Passing flatus, no BM's.    Objective: Vital signs in last 24 hours: Temp:  [97.5 F (36.4 C)-98 F (36.7 C)] 97.7 F (36.5 C) (04/11 0644) Pulse Rate:  [55-67] 62 (04/11 0644) Resp:  [9-18] 15 (04/11 0644) BP: (110-165)/(71-98) 155/78 (04/11 0644) SpO2:  [98 %-100 %] 100 % (04/11 0644) Weight:  [77.6 kg (171 lb 1.2 oz)] 77.6 kg (171 lb 1.2 oz) (04/11 0648)   Intake/Output from previous day: 04/10 0701 - 04/11 0700 In: 2876.3 [P.O.:360; I.V.:2066.3; IV Piggyback:450] Out: 580 [Urine:530; Blood:50] Intake/Output this shift: No intake/output data recorded.   General appearance: alert and cooperative GI: normal findings: soft, non-distended  Incision: no significant drainage  Lab Results:  Recent Labs    04/16/17 1958 04/17/17 0457  WBC 7.8 7.5  HGB 11.8* 10.6*  HCT 36.8* 32.5*  PLT 227 197   BMET Recent Labs    04/17/17 0457  NA 138  K 4.1  CL 107  CO2 23  GLUCOSE 154*  BUN 19  CREATININE 1.14  CALCIUM 8.6*   PT/INR Recent Labs    04/16/17 1958  LABPROT 14.8  INR 1.17   ABG No results for input(s): PHART, HCO3 in the last 72 hours.  Invalid input(s): PCO2, PO2  MEDS, Scheduled . acetaminophen  1,000 mg Oral Q6H  . alvimopan  12 mg Oral BID  . amiodarone  200 mg Oral Daily  . carvedilol  6.25 mg Oral TID  . enoxaparin (LOVENOX) injection  40 mg Subcutaneous Q24H  . famotidine  40 mg Oral QHS  . losartan  50 mg Oral Daily  . pravastatin  40 mg Oral q1800  . saccharomyces boulardii  250 mg Oral BID    Studies/Results: No results found.  Assessment: s/p Procedure(s): XI ROBOTIC  ASSISTED SIGMOIDECTOMY CYSTOSCOPY WITH CATHETER  REPLACEMENT FIREFLY Patient Active Problem List   Diagnosis Date Noted  . Colovesical fistula 04/16/2017  . Cardiac arrest ([place]) 11/27/2016  . Chest pain 03/05/2015  . Bigeminy 03/05/2015  . Anemia 03/05/2015  .  Elevated troponin 03/05/2015  . Elevated d-dimer 03/05/2015  . S/P prostatectomy 03/05/2015  . Prostate cancer [facility])     Expected post op course.    Plan: Advance diet  SL IVF's Monitor rectal bleeding, hold lovenox today.  Recheck labs in AM   LOS: 1 day     .Rosario Adie, [facility], [place]   04/17/2017 8:31 AM

## 2017-04-17 NOTE — Progress Notes (Signed)
Patient had ongoing bleeding from rectum.  Notified Dr. Marlou Starks , who ordered labs last night.  Have been monitoring overnight.   Patient still has clotted blood coming out of rectum.  Stated that he feels like he has is passing gas but blood comes out instead.  Will report to coming nurse for further follow up with Dr. This morning. Changed padding 3 times.  With approx 20-50 cc of clotted blood.

## 2017-04-18 LAB — BASIC METABOLIC PANEL
ANION GAP: 6 (ref 5–15)
BUN: 17 mg/dL (ref 6–20)
CO2: 27 mmol/L (ref 22–32)
Calcium: 8.5 mg/dL — ABNORMAL LOW (ref 8.9–10.3)
Chloride: 108 mmol/L (ref 101–111)
Creatinine, Ser: 1.38 mg/dL — ABNORMAL HIGH (ref 0.61–1.24)
GFR, EST AFRICAN AMERICAN: 57 mL/min — AB (ref >60)
GFR, EST NON AFRICAN AMERICAN: 49 mL/min — AB (ref >60)
GLUCOSE: 97 mg/dL (ref 65–99)
POTASSIUM: 3.9 mmol/L (ref 3.5–5.1)
SODIUM: 141 mmol/L (ref 135–145)

## 2017-04-18 LAB — CBC
HEMATOCRIT: 32.6 % — AB (ref 39.0–52.0)
HEMOGLOBIN: 10.6 g/dL — AB (ref 13.0–17.0)
MCH: 29.4 pg (ref 26.0–34.0)
MCHC: 32.5 g/dL (ref 30.0–36.0)
MCV: 90.3 fL (ref 78.0–100.0)
Platelets: 230 10*3/uL (ref 150–400)
RBC: 3.61 MIL/uL — AB (ref 4.22–5.81)
RDW: 14.5 % (ref 11.5–15.5)
WBC: 6.1 10*3/uL (ref 4.0–10.5)

## 2017-04-18 NOTE — Discharge Instructions (Signed)
ABDOMINAL SURGERY: POST OP INSTRUCTIONS ° °1. DIET: Follow a light bland diet the first 24 hours after arrival home, such as soup, liquids, crackers, etc.  Be sure to include lots of fluids daily.  Avoid fast food or heavy meals as your are more likely to get nauseated.  Do not eat any uncooked fruits or vegetables for the next 2 weeks as your colon heals. °2. Take your usually prescribed home medications unless otherwise directed. °3. PAIN CONTROL: °a. Pain is best controlled by a usual combination of three different methods TOGETHER: °i. Ice/Heat °ii. Over the counter pain medication °iii. Prescription pain medication °b. Most patients will experience some swelling and bruising around the incisions.  Ice packs or heating pads (30-60 minutes up to 6 times a day) will help. Use ice for the first few days to help decrease swelling and bruising, then switch to heat to help relax tight/sore spots and speed recovery.  Some people prefer to use ice alone, heat alone, alternating between ice & heat.  Experiment to what works for you.  Swelling and bruising can take several weeks to resolve.   °c. It is helpful to take an over-the-counter pain medication regularly for the first few weeks.  Choose one of the following that works best for you: °i. Naproxen (Aleve, etc)  Two 220mg tabs twice a day °ii. Ibuprofen (Advil, etc) Three 200mg tabs four times a day (every meal & bedtime) °iii. Acetaminophen (Tylenol, etc) 500-650mg four times a day (every meal & bedtime) °d. A  prescription for pain medication (such as oxycodone, hydrocodone, etc) should be given to you upon discharge.  Take your pain medication as prescribed.  °i. If you are having problems/concerns with the prescription medicine (does not control pain, nausea, vomiting, rash, itching, etc), please call us [phone] to see if we need to switch you to a different pain medicine that will work better for you and/or control your side effect better. °ii. If you  need a refill on your pain medication, please contact your pharmacy.  They will contact our office to request authorization. Prescriptions will not be filled after 5 pm or on week-ends. °4. Avoid getting constipated.  Between the surgery and the pain medications, it is common to experience some constipation.  Increasing fluid intake and taking a fiber supplement (such as Metamucil, Citrucel, FiberCon, MiraLax, etc) 1-2 times a day regularly will usually help prevent this problem from occurring.  A mild laxative (prune juice, Milk of Magnesia, MiraLax, etc) should be taken according to package directions if there are no bowel movements after 48 hours.   °5. Watch out for diarrhea.  If you have many loose bowel movements, simplify your diet to bland foods & liquids for a few days.  Stop any stool softeners and decrease your fiber supplement.  Switching to mild anti-diarrheal medications (Kayopectate, Pepto Bismol) can help.  If this worsens or does not improve, please call us. °6. Wash / shower every day.  You may shower over the incision / wound.  Avoid baths until the skin is fully healed.  Continue to shower over incision(s) after the dressing is off. °7. Remove your waterproof bandages 5 days after surgery.  You may leave the incision open to air.  You may replace a dressing/Band-Aid to cover the incision for comfort if you wish. °8. ACTIVITIES as tolerated:   °a. You may resume regular (light) daily activities beginning the next day--such as daily self-care, walking, climbing stairs--gradually increasing activities as   tolerated.  If you can walk 30 minutes without difficulty, it is safe to try more intense activity such as jogging, treadmill, bicycling, low-impact aerobics, swimming, etc. b. Save the most intensive and strenuous activity for last such as sit-ups, heavy lifting, contact sports, etc  Refrain from any heavy lifting or straining until you are off narcotics for pain control.   c. DO NOT PUSH THROUGH  PAIN.  Let pain be your guide: If it hurts to do something, don't do it.  Pain is your body warning you to avoid that activity for another week until the pain goes down. d. You may drive when you are no longer taking prescription pain medication, you can comfortably wear a seatbelt, and you can safely maneuver your car and apply brakes. e. Dennis Bast may have sexual intercourse when it is comfortable.  9. FOLLOW UP in our office a. Please call [facility] at [phone] to set up an appointment to see your surgeon in the office for a follow-up appointment approximately 1-2 weeks after your surgery. b. Make sure that you call for this appointment the day you arrive home to insure a convenient appointment time. 10. IF YOU HAVE DISABILITY OR FAMILY LEAVE FORMS, BRING THEM TO THE OFFICE FOR PROCESSING.  DO NOT GIVE THEM TO YOUR DOCTOR.   WHEN TO CALL us [phone]: 1. Poor pain control 2. Reactions / problems with new medications (rash/itching, nausea, etc)  3. Fever over 101.5 F (38.5 C) 4. Inability to urinate 5. Nausea and/or vomiting 6. Worsening swelling or bruising 7. Continued bleeding from incision. 8. Increased pain, redness, or drainage from the incision  The clinic staff is available to answer your questions during regular business hours (8:30am-5pm).  Please dont hesitate to call and ask to speak to one of our nurses for clinical concerns.   A surgeon from [facility] is always on call at the hospitals   If you have a medical emergency, go to the nearest emergency room or call 911.    [facility], [place], [place], [place], [place]  [address] ? MAIN: [phone] ? TOLL FREE: [phone] ? FAX [phone] [email or web address]     Foley Catheter Care A soft, flexible tube (Foley catheter) may have been placed in your bladder to drain urine and fluid. Follow these instructions: Taking Care of the Catheter  Keep the area  where the catheter leaves your body clean.   Attach the catheter to the leg so there is no tension on the catheter.   Keep the drainage bag below the level of the bladder, but keep it OFF the floor.   Do not take long soaking baths. Your caregiver will give instructions about showering.   Wash your hands before touching ANYTHING related to the catheter or bag.   Using mild soap and warm water on a washcloth:   Clean the area closest to the catheter insertion site using a circular motion around the catheter.   Clean the catheter itself by wiping AWAY from the insertion site for several inches down the tube.   NEVER wipe upward as this could sweep bacteria up into the urethra (tube in your body that normally drains the bladder) and cause infection.   Place a small amount of sterile lubricant at the tip of the penis where the catheter is entering.  Taking Care of the Drainage Bags  Two drainage bags may be taken home: a large overnight drainage bag, and a smaller  leg bag which fits underneath clothing.   It is okay to wear the overnight bag at any time, but NEVER wear the smaller leg bag at night.   Keep the drainage bag well below the level of your bladder. This prevents backflow of urine into the bladder and allows the urine to drain freely.   Anchor the tubing to your leg to prevent pulling or tension on the catheter. Use tape or a leg strap provided by the hospital.   Empty the drainage bag when it is 1/2 to 3/4 full. Wash your hands before and after touching the bag.   Periodically check the tubing for kinks to make sure there is no pressure on the tubing which could restrict the flow of urine.  Changing the Drainage Bags  Cleanse both ends of the clean bag with alcohol before changing.   Pinch off the rubber catheter to avoid urine spillage during the disconnection.   Disconnect the dirty bag and connect the clean one.   Empty the dirty bag carefully to avoid a urine spill.    Attach the new bag to the leg with tape or a leg strap.  Cleaning the Drainage Bags  Whenever a drainage bag is disconnected, it must be cleaned quickly so it is ready for the next use.   Wash the bag in warm, soapy water.   Rinse the bag thoroughly with warm water.   Soak the bag for 30 minutes in a solution of white vinegar and water (1 cup vinegar to 1 quart warm water).   Rinse with warm water.  SEEK MEDICAL CARE IF:   You have chills or night sweats.   You are leaking around your catheter or have problems with your catheter. It is not uncommon to have sporadic leakage around your catheter as a result of bladder spasms. If the leakage stops, there is not much need for concern. If you are uncertain, call your caregiver.   You develop side effects that you think are coming from your medicines.  SEEK IMMEDIATE MEDICAL CARE IF:   You are suddenly unable to urinate. Check to see if there are any kinks in the drainage tubing that may cause this. If you cannot find any kinks, call your caregiver immediately. This is an emergency.   You develop shortness of breath or chest pains.   Bleeding persists or clots develop in your urine.   You have a fever.   You develop pain in your back or over your lower belly (abdomen).   You develop pain or swelling in your legs.   Any problems you are having get worse rather than better.  MAKE SURE YOU:   Understand these instructions.   Will watch your condition.   Will get help right away if you are not doing well or get worse.

## 2017-04-18 NOTE — Discharge Summary (Signed)
Physician Discharge Summary  Patient ID: Jesse Mann MRN: [id number] DOB/AGE: 03-19-1940 77 y.o.  Admit date: 04/16/2017 Discharge date: 04/18/2017  Admission Diagnoses: Colovesical fistula  Discharge Diagnoses:  Active Problems:   Colovesical fistula   Discharged Condition: good  Hospital Course: Pt admitted after robotic sigmoidectomy.  His diet was advanced as tolerated.  He did have some self limited rectal bleeding that stopped on POD 1.  His hgb was stable on POD2.  By POD 2 he was having bowel function and tolerating a diet.  He was urinating well.  His Cr was elevated slightly on day of d/c.  We will arrange to have that checked again on POD 5.  He will continue his Foley at home for a total of 10 days due to his bladder repair.  I encouraged him to drink plenty of fluids over the weekend.  Consults: None  Significant Diagnostic Studies: labs: CBC, bmet  Treatments: IV hydration, analgesia: acetaminophen and surgery: robotic sigmoidectomy  Discharge Exam: Blood pressure 138/74, pulse 63, temperature 98.1 F (36.7 C), temperature source Oral, resp. rate 14, height 5\' 6"  (1.676 m), weight 76.1 kg (167 lb 12.3 oz), SpO2 98 %. General appearance: alert and cooperative GI: normal findings: Soft, nontender, nondistended Incision/Wound: clean, some ecchymosis.  Intact  Disposition:    Allergies as of 04/18/2017   No Known Allergies     Medication List    TAKE these medications   acetaminophen 500 MG tablet Commonly known as:  TYLENOL Take 500 mg by mouth every 6 (six) hours as needed (for pain.).   amiodarone 200 MG tablet Commonly known as:  PACERONE Take 200 mg daily by mouth.   aspirin 81 MG tablet Take 81 mg by mouth daily.   carvedilol 12.5 MG tablet Commonly known as:  COREG Take 6.25 mg 3 (three) times daily by mouth.   famotidine 40 MG tablet Commonly known as:  PEPCID Take 40 mg at bedtime by mouth.   FISH OIL-VITAMIN D PO Take 1 capsule by  mouth 2 (two) times daily.   losartan 50 MG tablet Commonly known as:  COZAAR Take 50 mg by mouth daily.   lovastatin 40 MG tablet Commonly known as:  MEVACOR Take 40 mg at bedtime by mouth.   nitroGLYCERIN 0.4 MG SL tablet Commonly known as:  NITROSTAT Place 0.4 mg under the tongue every 5 (five) minutes as needed for chest pain.      Follow-up Information    Ardis Hughs, MD Follow up on 04/30/2017.   Specialty:  Urology Why:  Catheter removal at 2:45pm Contact information: [place] [address] [phone]        Leighton Ruff, MD. Schedule an appointment as soon as possible for a visit in 2 week(s).   Specialty:  General Surgery Contact information: [address] [place] [address] [phone]           Signed: Rosario Adie 3/00/7622, 6:33 AM

## 2017-04-18 NOTE — Progress Notes (Signed)
Pt was discharged home today. Instructions were reviewed with patient, and questions were answered. Pt was taken to main entrance via wheelchair by NT.  

## 2017-04-21 DIAGNOSIS — N321 Vesicointestinal fistula: Secondary | ICD-10-CM | POA: Diagnosis not present

## 2017-04-30 DIAGNOSIS — N321 Vesicointestinal fistula: Secondary | ICD-10-CM | POA: Diagnosis not present

## 2017-05-02 ENCOUNTER — Encounter: Payer: Self-pay | Admitting: *Deleted

## 2017-06-12 DIAGNOSIS — H52223 Regular astigmatism, bilateral: Secondary | ICD-10-CM | POA: Diagnosis not present

## 2017-06-27 ENCOUNTER — Inpatient Hospital Stay: Payer: Medicare HMO | Attending: Oncology

## 2017-06-27 DIAGNOSIS — Z5111 Encounter for antineoplastic chemotherapy: Secondary | ICD-10-CM | POA: Diagnosis not present

## 2017-06-27 DIAGNOSIS — C61 Malignant neoplasm of prostate: Secondary | ICD-10-CM | POA: Diagnosis not present

## 2017-06-27 MED ORDER — LEUPROLIDE ACETATE (4 MONTH) 30 MG IM KIT
30.0000 mg | PACK | Freq: Once | INTRAMUSCULAR | Status: AC
  Administered 2017-06-27: 30 mg via INTRAMUSCULAR
  Filled 2017-06-27: qty 30

## 2017-07-18 DIAGNOSIS — Z4502 Encounter for adjustment and management of automatic implantable cardiac defibrillator: Secondary | ICD-10-CM | POA: Diagnosis not present

## 2017-07-18 DIAGNOSIS — Z95810 Presence of automatic (implantable) cardiac defibrillator: Secondary | ICD-10-CM | POA: Diagnosis not present

## 2017-08-01 DIAGNOSIS — C61 Malignant neoplasm of prostate: Secondary | ICD-10-CM | POA: Diagnosis not present

## 2017-08-01 DIAGNOSIS — I420 Dilated cardiomyopathy: Secondary | ICD-10-CM | POA: Diagnosis not present

## 2017-08-01 DIAGNOSIS — Z79899 Other long term (current) drug therapy: Secondary | ICD-10-CM | POA: Diagnosis not present

## 2017-08-01 DIAGNOSIS — R635 Abnormal weight gain: Secondary | ICD-10-CM | POA: Diagnosis not present

## 2017-08-01 DIAGNOSIS — I255 Ischemic cardiomyopathy: Secondary | ICD-10-CM | POA: Diagnosis not present

## 2017-08-01 DIAGNOSIS — Z8546 Personal history of malignant neoplasm of prostate: Secondary | ICD-10-CM | POA: Diagnosis not present

## 2017-08-01 DIAGNOSIS — Z0000 Encounter for general adult medical examination without abnormal findings: Secondary | ICD-10-CM | POA: Diagnosis not present

## 2017-08-20 DIAGNOSIS — L568 Other specified acute skin changes due to ultraviolet radiation: Secondary | ICD-10-CM | POA: Diagnosis not present

## 2017-08-20 DIAGNOSIS — Z0000 Encounter for general adult medical examination without abnormal findings: Secondary | ICD-10-CM | POA: Diagnosis not present

## 2017-08-20 DIAGNOSIS — Z6827 Body mass index (BMI) 27.0-27.9, adult: Secondary | ICD-10-CM | POA: Diagnosis not present

## 2017-08-26 DIAGNOSIS — Z95810 Presence of automatic (implantable) cardiac defibrillator: Secondary | ICD-10-CM | POA: Diagnosis not present

## 2017-08-26 DIAGNOSIS — I2510 Atherosclerotic heart disease of native coronary artery without angina pectoris: Secondary | ICD-10-CM | POA: Diagnosis not present

## 2017-08-26 DIAGNOSIS — I255 Ischemic cardiomyopathy: Secondary | ICD-10-CM | POA: Diagnosis not present

## 2017-09-03 DIAGNOSIS — Z79899 Other long term (current) drug therapy: Secondary | ICD-10-CM | POA: Diagnosis not present

## 2017-09-03 DIAGNOSIS — I10 Essential (primary) hypertension: Secondary | ICD-10-CM | POA: Diagnosis not present

## 2017-09-03 DIAGNOSIS — D638 Anemia in other chronic diseases classified elsewhere: Secondary | ICD-10-CM | POA: Diagnosis not present

## 2017-09-03 DIAGNOSIS — D519 Vitamin B12 deficiency anemia, unspecified: Secondary | ICD-10-CM | POA: Diagnosis not present

## 2017-09-03 DIAGNOSIS — D649 Anemia, unspecified: Secondary | ICD-10-CM | POA: Diagnosis not present

## 2017-09-03 DIAGNOSIS — I255 Ischemic cardiomyopathy: Secondary | ICD-10-CM | POA: Diagnosis not present

## 2017-09-04 DIAGNOSIS — I10 Essential (primary) hypertension: Secondary | ICD-10-CM | POA: Diagnosis not present

## 2017-09-04 DIAGNOSIS — I255 Ischemic cardiomyopathy: Secondary | ICD-10-CM | POA: Diagnosis not present

## 2017-09-04 DIAGNOSIS — D638 Anemia in other chronic diseases classified elsewhere: Secondary | ICD-10-CM | POA: Diagnosis not present

## 2017-09-11 DIAGNOSIS — Z6826 Body mass index (BMI) 26.0-26.9, adult: Secondary | ICD-10-CM | POA: Diagnosis not present

## 2017-09-11 DIAGNOSIS — G4730 Sleep apnea, unspecified: Secondary | ICD-10-CM | POA: Diagnosis not present

## 2017-09-23 DIAGNOSIS — G4733 Obstructive sleep apnea (adult) (pediatric): Secondary | ICD-10-CM | POA: Diagnosis not present

## 2017-10-13 DIAGNOSIS — G4733 Obstructive sleep apnea (adult) (pediatric): Secondary | ICD-10-CM | POA: Diagnosis not present

## 2017-10-17 DIAGNOSIS — Z95810 Presence of automatic (implantable) cardiac defibrillator: Secondary | ICD-10-CM | POA: Diagnosis not present

## 2017-10-26 IMAGING — CT CT ABD-PELV W/ CM
4 of 11 series · 11 of 46 positions shown, 17 images · IV contrast (OMNIPAQUE 300)
Comparison: None.

CLINICAL DATA: Recent prostatectomy with chest pain and shoulder
pain, initial encounter

EXAM:
CT ANGIOGRAPHY CHEST
CT ABDOMEN AND PELVIS WITH CONTRAST
TECHNIQUE: Multidetector CT imaging of the chest was performed using the
standard protocol during bolus administration of intravenous
contrast. Multiplanar CT image reconstructions and MIPs were
obtained to evaluate the vascular anatomy. Multidetector CT imaging
of the abdomen and pelvis was performed using the standard protocol
during bolus administration of intravenous contrast.
CONTRAST:  80mL OMNIPAQUE IOHEXOL 350 MG/ML SOLN

[Series 5: abd/pel with · axial · 0.80mm/px · z∈[-486,-191]mm · 4 of 99 slices shown, 9 images]
[im 20/99  soft-tissue]
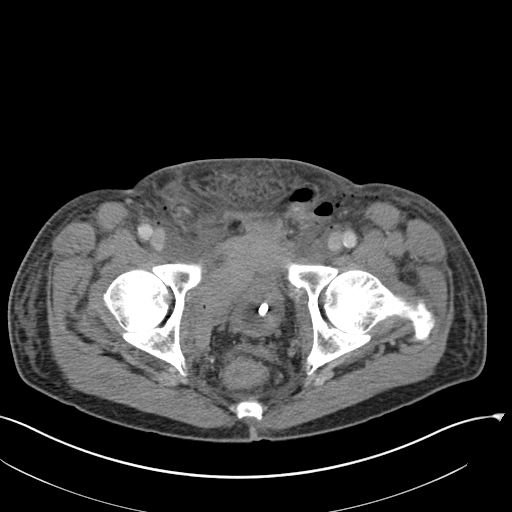
[im 20/99  lung]
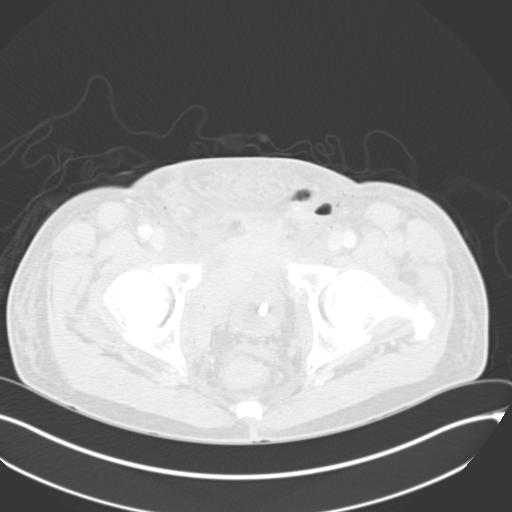
[im 20/99  bone]
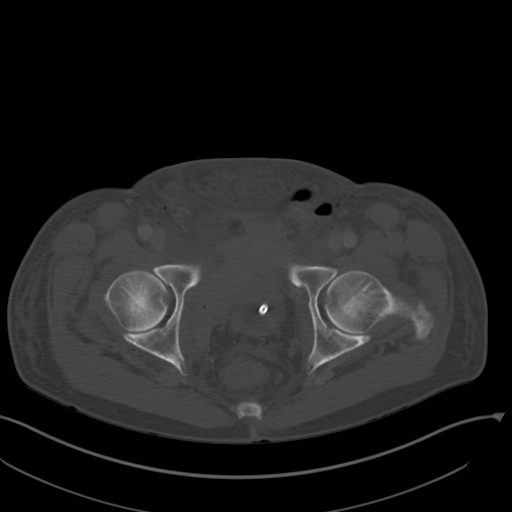
[im 40/99  soft-tissue]
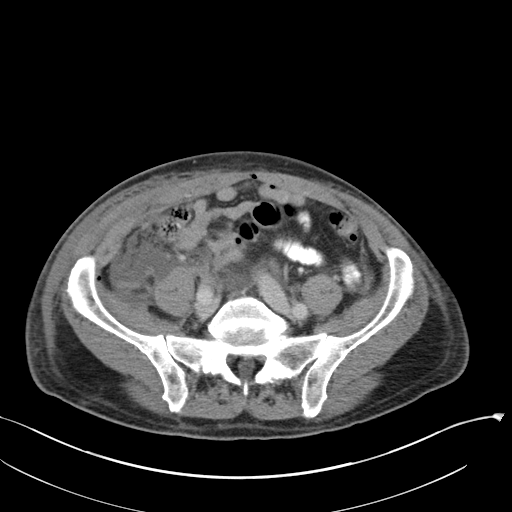
[im 40/99  lung]
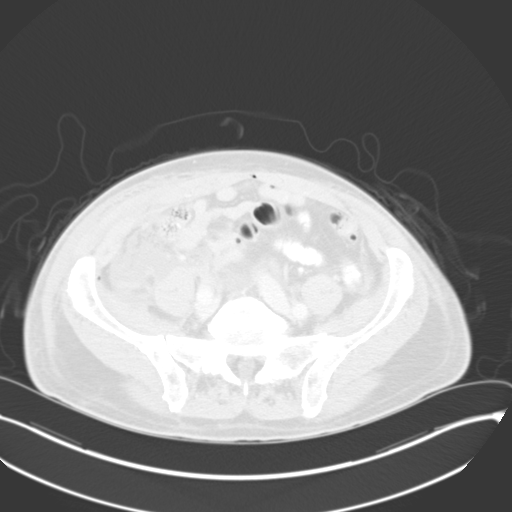
[im 59/99  soft-tissue]
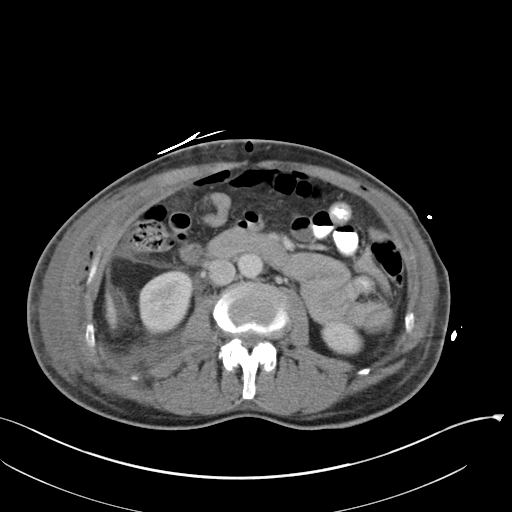
[im 59/99  lung]
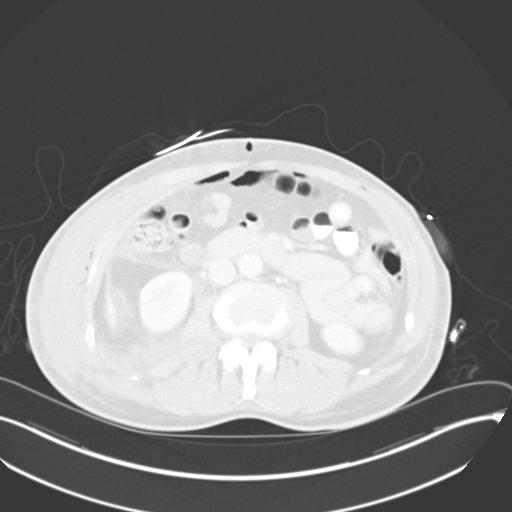
[im 79/99  soft-tissue]
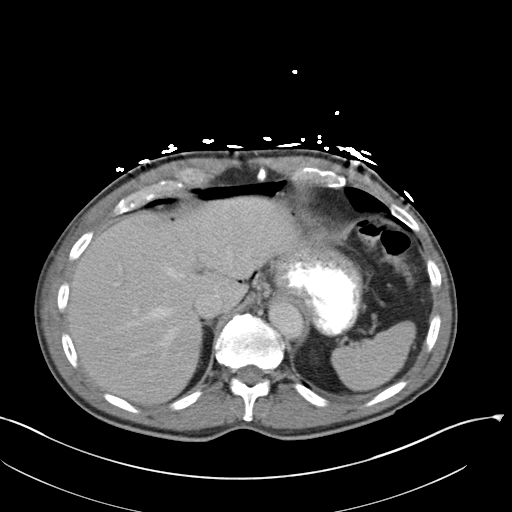
[im 79/99  lung]
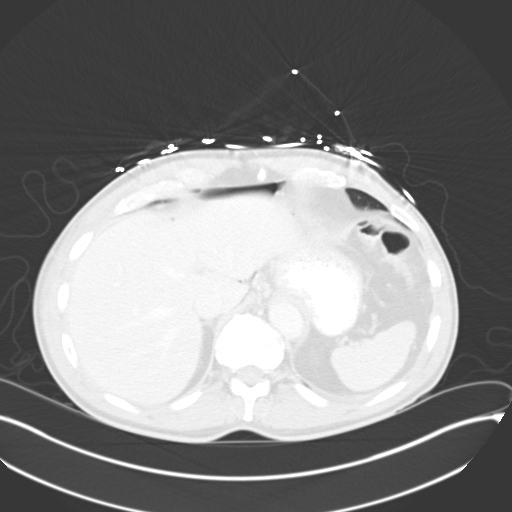

[Series 6: full delay · axial · delayed · 0.80mm/px · z∈[-492,-302]mm · 3 of 76 slices shown]
[im 19/76  soft-tissue]
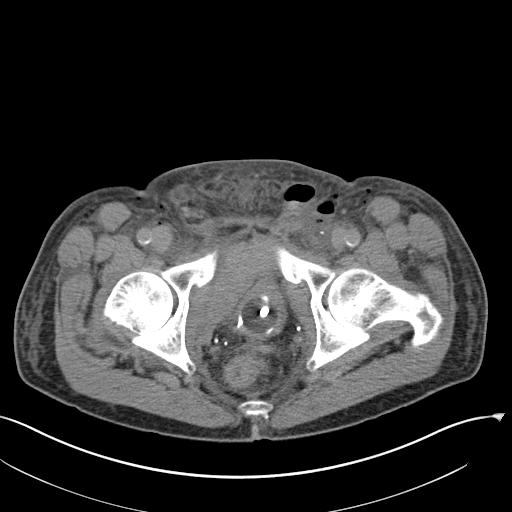
[im 38/76  soft-tissue]
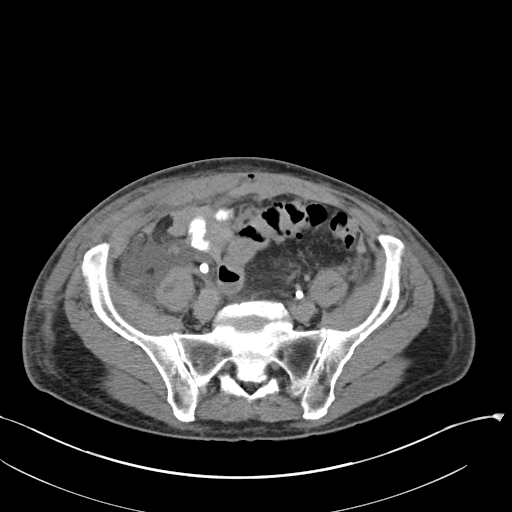
[im 57/76  soft-tissue]
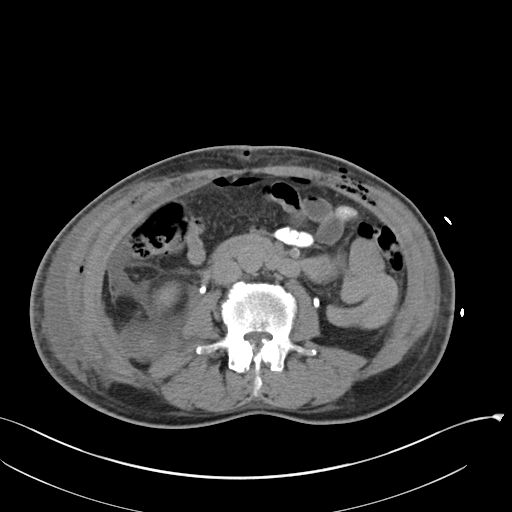

[Series 7: coronal mpr · coronal · 0.45mm/px · 1 of 151 slices shown, 2 images]
[im 76/151  soft-tissue]
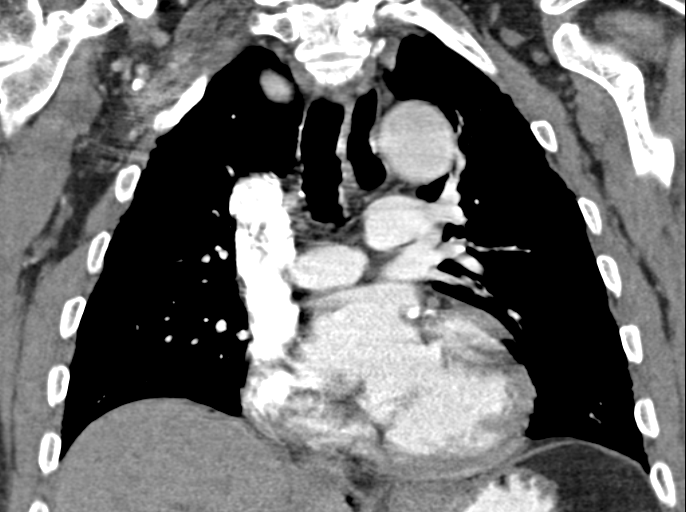
[im 76/151  bone]
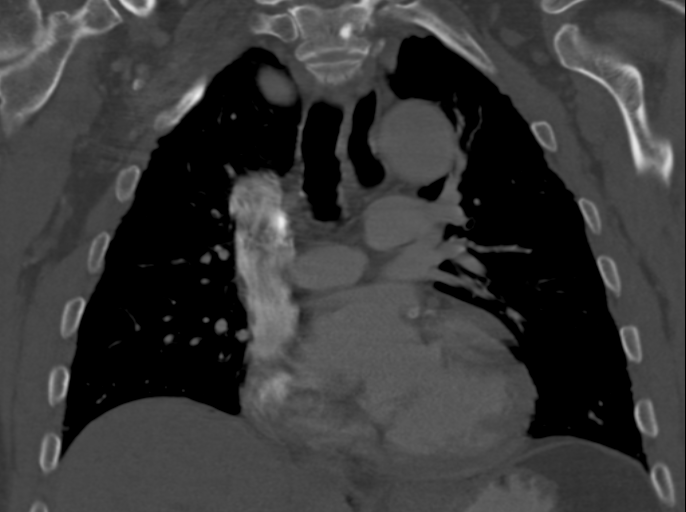

[Series 12: thins for pacs · axial · 0.79mm/px · z∈[-151,-98]mm · 3 of 230 slices shown]
[im 18/230  soft-tissue]
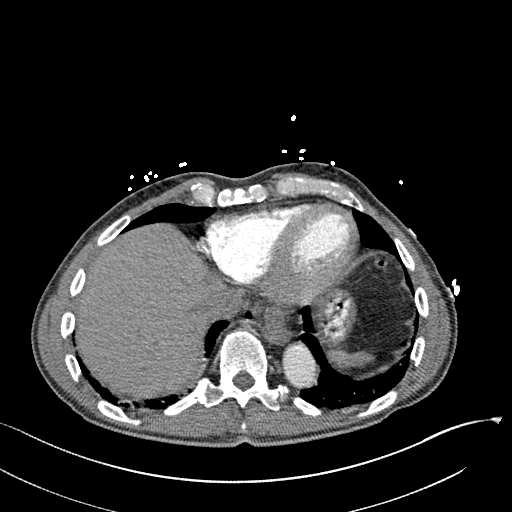
[im 53/230  soft-tissue]
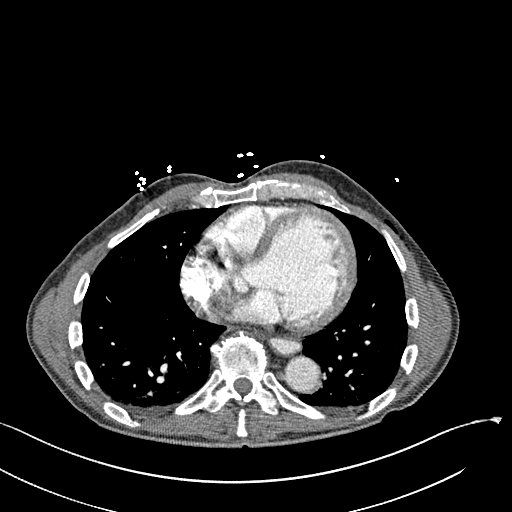
[im 71/230  soft-tissue]
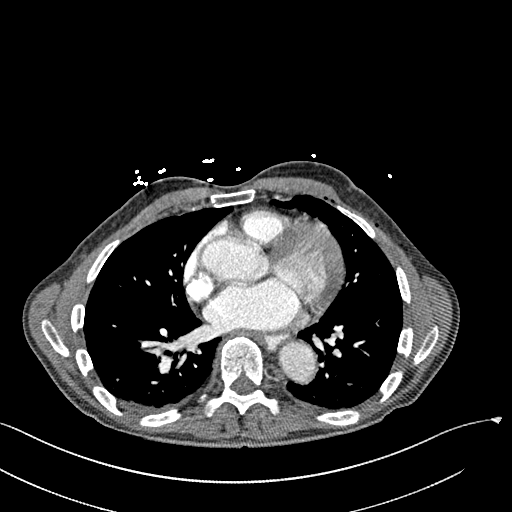

[11 of 46 positions shown; findings below may reference images not displayed]

FINDINGS: CTA CHEST FINDINGS

The lungs are well aerated bilaterally. No focal infiltrate,
effusion or pneumothorax is noted. The thoracic inlet is within
normal limits. Thoracic aorta shows no evidence of dissection. The
pulmonary artery shows a normal branching pattern. No filling
defects to suggest pulmonary embolism are identified. Coronary
calcifications are noted.

Some air is noted within the left chest wall anteriorly consistent
with the recent surgery. No acute bony abnormality is noted.

CT ABDOMEN and PELVIS FINDINGS

The liver, gallbladder, spleen, adrenal glands and pancreas are
within normal limits. The kidneys are well visualized bilaterally. A
hypodense lesion is noted in the left kidney likely representing a
cyst but incompletely evaluated on this exam. It measures
approximately 1 cm. Some scattered free air is noted as well as air
within the abdominal wall consistent with the recent surgery.

Fluid is noted posterior to the right kidney and extending along the
right psoas muscle into the pelvis likely related to postoperative
change. This does not appear to be acute in nature. The bladder is
decompressed by Foley catheter. Changes are noted in the pelvic wall
particularly on the right consistent with hemorrhage. This appears
to be more acute with respect to the fluid in the right
retroperitoneum which appears more chronic. No acute bony
abnormality is noted. Scattered diverticular change is noted without
diverticulitis.

Review of the MIP images confirms the above findings.
IMPRESSION: Postsurgical changes are noted consistent with prostatectomy. Some
postoperative hemorrhage is noted in the right pelvic wall. The
overall area measures approximately 11 by 5 cm in greatest AP and
transverse dimensions. Some more chronic appearing fluid is noted
extending superiorly along the right psoas muscle posterior to the
kidney. This is also likely related to the recent surgery. Followup
imaging is recommended to assess for resolution

No evidence of pulmonary embolism.

Hypodensity within the left kidney. This may represent a small cyst.
Nonemergent workup can be performed as clinically indicated.

## 2017-10-29 ENCOUNTER — Inpatient Hospital Stay: Payer: Medicare HMO | Attending: Oncology | Admitting: Oncology

## 2017-10-29 ENCOUNTER — Inpatient Hospital Stay: Payer: Medicare HMO

## 2017-10-29 ENCOUNTER — Telehealth: Payer: Self-pay | Admitting: Oncology

## 2017-10-29 VITALS — BP 135/79 | HR 88 | Temp 97.6°F | Resp 18 | Ht 66.0 in | Wt 169.3 lb

## 2017-10-29 DIAGNOSIS — E291 Testicular hypofunction: Secondary | ICD-10-CM

## 2017-10-29 DIAGNOSIS — Z5111 Encounter for antineoplastic chemotherapy: Secondary | ICD-10-CM | POA: Diagnosis not present

## 2017-10-29 DIAGNOSIS — Z9079 Acquired absence of other genital organ(s): Secondary | ICD-10-CM | POA: Diagnosis not present

## 2017-10-29 DIAGNOSIS — K632 Fistula of intestine: Secondary | ICD-10-CM | POA: Diagnosis not present

## 2017-10-29 DIAGNOSIS — C61 Malignant neoplasm of prostate: Secondary | ICD-10-CM | POA: Diagnosis not present

## 2017-10-29 LAB — CBC WITH DIFFERENTIAL (CANCER CENTER ONLY)
Abs Immature Granulocytes: 0.01 10*3/uL (ref 0.00–0.07)
Basophils Absolute: 0 10*3/uL (ref 0.0–0.1)
Basophils Relative: 1 %
EOS PCT: 11 %
Eosinophils Absolute: 0.5 10*3/uL (ref 0.0–0.5)
HEMATOCRIT: 40.7 % (ref 39.0–52.0)
Hemoglobin: 13.1 g/dL (ref 13.0–17.0)
Immature Granulocytes: 0 %
LYMPHS ABS: 1.2 10*3/uL (ref 0.7–4.0)
Lymphocytes Relative: 27 %
MCH: 28.9 pg (ref 26.0–34.0)
MCHC: 32.2 g/dL (ref 30.0–36.0)
MCV: 89.8 fL (ref 80.0–100.0)
MONOS PCT: 12 %
Monocytes Absolute: 0.5 10*3/uL (ref 0.1–1.0)
NRBC: 0 % (ref 0.0–0.2)
Neutro Abs: 2.2 10*3/uL (ref 1.7–7.7)
Neutrophils Relative %: 49 %
PLATELETS: 209 10*3/uL (ref 150–400)
RBC: 4.53 MIL/uL (ref 4.22–5.81)
RDW: 13.6 % (ref 11.5–15.5)
WBC Count: 4.4 10*3/uL (ref 4.0–10.5)

## 2017-10-29 LAB — CMP (CANCER CENTER ONLY)
ALK PHOS: 61 U/L (ref 38–126)
ALT: 16 U/L (ref 0–44)
AST: 26 U/L (ref 15–41)
Albumin: 3.8 g/dL (ref 3.5–5.0)
Anion gap: 12 (ref 5–15)
BILIRUBIN TOTAL: 0.7 mg/dL (ref 0.3–1.2)
BUN: 23 mg/dL (ref 8–23)
CALCIUM: 9.2 mg/dL (ref 8.9–10.3)
CO2: 24 mmol/L (ref 22–32)
CREATININE: 1.52 mg/dL — AB (ref 0.61–1.24)
Chloride: 106 mmol/L (ref 98–111)
GFR, EST AFRICAN AMERICAN: 50 mL/min — AB (ref >60)
GFR, Estimated: 43 mL/min — ABNORMAL LOW (ref >60)
GLUCOSE: 127 mg/dL — AB (ref 70–99)
Potassium: 4.2 mmol/L (ref 3.5–5.1)
SODIUM: 142 mmol/L (ref 135–145)
TOTAL PROTEIN: 6.8 g/dL (ref 6.5–8.1)

## 2017-10-29 MED ORDER — LEUPROLIDE ACETATE (4 MONTH) 30 MG IM KIT
30.0000 mg | PACK | Freq: Once | INTRAMUSCULAR | Status: AC
  Administered 2017-10-29: 30 mg via INTRAMUSCULAR
  Filled 2017-10-29: qty 30

## 2017-10-29 NOTE — Telephone Encounter (Signed)
Appts scheduled avs/calendar printed per 10/23 los °

## 2017-10-29 NOTE — Progress Notes (Signed)
Hematology and Oncology Follow Up Visit  Jesse Mann [id number] June 23, 1940 77 y.o. 10/29/2017 10:02 AM Jesse Mann, MDScott, Jesse Bouillon, MD   Principle Diagnosis: 7-year man with prostate cancer diagnosed in December 2016 with Gleason score 5+4 = 9 and a PSA of 12.22.  He developed relapsed disease in 2017.   Prior Therapy:  He is status post radical prostatectomy done on 03/03/2015 utilizing a nerve sparing approach. 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 Tumor involves bilateral seminal vesicles with margins involved with cancer. 2 lymph nodes were involved for a total of 8 sampled.    He developed biochemical relapse in July 2017 with a PSA of 1.28.  Current therapy: Lupron 30 mg injection given every 4 months first dose given on 08/08/2015 to complete at least 2 years of therapy.  Interim History: Jesse Mann is here for a repeat visit today.  Since last visit, he underwent closure of a bladder fistula under the care of Dr. Marcello Moores and Dr. Louis Meckel completed on April 16, 2017.  He has recovered reasonably well at this time without any residual issues or complications.  He denies any urinary difficulties and moving his bowels regularly.  He denies any dysuria or hematuria.  He tolerated Lupron without any issues or complications.  He does not report any headaches, blurry vision, syncope or seizures.  He denies any alteration mental status or confusion.  He does not report any fevers or chills or sweats. He does not report any cough, wheezing or hemoptysis. He does not report any nausea, vomiting or abdominal pain.  Denies any changes in bowel habits.  He does not report any frequency urgency or hesitancy.  He denies any bleeding or clotting tendency.  Not a lymphadenopathy or petechiae.  He does not report any changes in his mood.  Remaining review of systems is negative.  Medications: I have reviewed the patient's current medications.  Current  Outpatient Medications  Medication Sig Dispense Refill  . acetaminophen (TYLENOL) 500 MG tablet Take 500 mg by mouth every 6 (six) hours as needed (for pain.).    Marland Kitchen amiodarone (PACERONE) 200 MG tablet Take 200 mg daily by mouth.    Marland Kitchen aspirin 81 MG tablet Take 81 mg by mouth daily.    . carvedilol (COREG) 12.5 MG tablet Take 6.25 mg 3 (three) times daily by mouth.    . famotidine (PEPCID) 40 MG tablet Take 40 mg at bedtime by mouth.    Marland Kitchen FISH OIL-VITAMIN D PO Take 1 capsule by mouth 2 (two) times daily.    Marland Kitchen losartan (COZAAR) 50 MG tablet Take 50 mg by mouth daily.    Marland Kitchen lovastatin (MEVACOR) 40 MG tablet Take 40 mg at bedtime by mouth.    . nitroGLYCERIN (NITROSTAT) 0.4 MG SL tablet Place 0.4 mg under the tongue every 5 (five) minutes as needed for chest pain.      No current facility-administered medications for this visit.      Allergies: No Known Allergies  Past Medical History, Surgical history, Social history, and Family History were reviewed and updated.   Physical Exam: Blood pressure 135/79, pulse 88, temperature 97.6 F (36.4 C), temperature source Oral, resp. rate 18, height 5\' 6"  (1.676 m), weight 169 lb 4.8 oz (76.8 kg), SpO2 100 %.    ECOG: 0   General appearance: Comfortable appearing without any discomfort Head: Normocephalic without any trauma Oropharynx: Mucous membranes are moist and pink without any thrush or ulcers.  Eyes: Pupils are equal and round reactive to light. Lymph nodes: No cervical, supraclavicular, inguinal or axillary lymphadenopathy.   Heart:regular rate and rhythm.  S1 and S2 without leg edema. Lung: Clear without any rhonchi or wheezes.  No dullness to percussion. Abdomin: Soft, nontender, nondistended with good bowel sounds.  No hepatosplenomegaly. Musculoskeletal: No joint deformity or effusion.  Full range of motion noted. Neurological: No deficits noted on motor, sensory and deep tendon reflex exam. Skin: No petechial rash or dryness.   Appeared moist.     Lab Results: Lab Results  Component Value Date   WBC 6.1 04/18/2017   HGB 10.6 (L) 04/18/2017   HCT 32.6 (L) 04/18/2017   MCV 90.3 04/18/2017   PLT 230 04/18/2017     Chemistry      Component Value Date/Time   NA 141 04/18/2017 0447   NA 140 11/27/2016 1107   K 3.9 04/18/2017 0447   K 4.4 11/27/2016 1107   CL 108 04/18/2017 0447   CO2 27 04/18/2017 0447   CO2 27 11/27/2016 1107   BUN 17 04/18/2017 0447   BUN 18.9 11/27/2016 1107   CREATININE 1.38 (H) 04/18/2017 0447   CREATININE 1.2 11/27/2016 1107      Component Value Date/Time   CALCIUM 8.5 (L) 04/18/2017 0447   CALCIUM 9.0 11/27/2016 1107   ALKPHOS 83 02/28/2017 1029   ALKPHOS 72 11/27/2016 1107   AST 20 02/28/2017 1029   AST 21 11/27/2016 1107   ALT 15 02/28/2017 1029   ALT 19 11/27/2016 1107   BILITOT 0.4 02/28/2017 1029   BILITOT 0.42 11/27/2016 1107      Results for Jesse, Mann (MRN [id number]) as of 10/29/2017 10:06  Ref. Range 11/27/2016 11:07 02/28/2017 10:29  Prostate Specific Ag, Serum Latest Ref Range: 0.0 - 4.0 ng/mL <0.1 <0.1     Impression and Plan:   77 year old man with:  1.  Biochemical relapse of prostate cancer documented in 2017.  He was initially diagnosed in December 2016 with a Gleason score 4+5 = 9 and a PSA of 12.   He is currently on androgen deprivation therapy with Lupron 30 mg every 4 months with PSA remains undetectable.  Risks and benefits of continuing this intermittently versus indefinitely was reviewed today.  He is in favor of doing intermittent androgen deprivation and the plan is to withhold any Lupron unless his PSA starts to rise again in the future.  2. Bone health: I recommended continuing calcium and vitamin D and monitor his bone density periodically.  3.  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 Marcello Moores with Dr. Louis Meckel on April 16, 2017.  4.  Follow-up: We will be in 4 months to follow his  progress.  15  minutes was spent with the patient face-to-face today.  More than 50% of time was dedicated to reviewing the natural course of his disease, treatment options and coordinating plan of care.   10/23/201910:02 AM e

## 2017-10-29 NOTE — Patient Instructions (Signed)
Leuprolide depot injection What is this medicine? LEUPROLIDE (loo PROE lide) is a man-made protein that acts like a natural hormone in the body. It decreases testosterone in men and decreases estrogen in women. In men, this medicine is used to treat advanced prostate cancer. In women, some forms of this medicine may be used to treat endometriosis, uterine fibroids, or other male hormone-related problems. This medicine may be used for other purposes; ask your health care provider or pharmacist if you have questions. What should I tell my health care provider before I take this medicine? They need to know if you have any of these conditions: -diabetes -heart disease or previous heart attack -high blood pressure -high cholesterol -osteoporosis -pain or difficulty passing urine -spinal cord metastasis -stroke -tobacco smoker -unusual vaginal bleeding (women) -an unusual or allergic reaction to leuprolide, benzyl alcohol, other medicines, foods, dyes, or preservatives -pregnant or trying to get pregnant -breast-feeding How should I use this medicine? This medicine is for injection into a muscle or for injection under the skin. It is given by a health care professional in a hospital or clinic setting. The specific product will determine how it will be given to you. Make sure you understand which product you receive and how often you will receive it. Talk to your pediatrician regarding the use of this medicine in children. Special care may be needed. Overdosage: If you think you have taken too much of this medicine contact a poison control center or emergency room at once. NOTE: This medicine is only for you. Do not share this medicine with others. What if I miss a dose? It is important not to miss a dose. Call your doctor or health care professional if you are unable to keep an appointment. Depot injections: Depot injections are given either once-monthly, every 12 weeks, every 16 weeks, or  every 24 weeks depending on the product you are prescribed. The product you are prescribed will be based on if you are male or male, and your condition. Make sure you understand your product and dosing. What may interact with this medicine? Do not take this medicine with any of the following medications: -chasteberry This medicine may also interact with the following medications: -herbal or dietary supplements, like black cohosh or DHEA -male hormones, like estrogens or progestins and birth control pills, patches, rings, or injections -male hormones, like testosterone This list may not describe all possible interactions. Give your health care provider a list of all the medicines, herbs, non-prescription drugs, or dietary supplements you use. Also tell them if you smoke, drink alcohol, or use illegal drugs. Some items may interact with your medicine. What should I watch for while using this medicine? Visit your doctor or health care professional for regular checks on your progress. During the first weeks of treatment, your symptoms may get worse, but then will improve as you continue your treatment. You may get hot flashes, increased bone pain, increased difficulty passing urine, or an aggravation of nerve symptoms. Discuss these effects with your doctor or health care professional, some of them may improve with continued use of this medicine. Male patients may experience a menstrual cycle or spotting during the first months of therapy with this medicine. If this continues, contact your doctor or health care professional. What side effects may I notice from receiving this medicine? Side effects that you should report to your doctor or health care professional as soon as possible: -allergic reactions like skin rash, itching or hives, swelling of the   face, lips, or tongue -breathing problems -chest pain -depression or memory disorders -pain in your legs or groin -pain at site where injected or  implanted -severe headache -swelling of the feet and legs -visual changes -vomiting Side effects that usually do not require medical attention (report to your doctor or health care professional if they continue or are bothersome): -breast swelling or tenderness -decrease in sex drive or performance -diarrhea -hot flashes -loss of appetite -muscle, joint, or bone pains -nausea -redness or irritation at site where injected or implanted -skin problems or acne This list may not describe all possible side effects. Call your doctor for medical advice about side effects. You may report side effects to FDA at 1-800-FDA-1088. Where should I keep my medicine? This drug is given in a hospital or clinic and will not be stored at home. NOTE: This sheet is a summary. It may not cover all possible information. If you have questions about this medicine, talk to your doctor, pharmacist, or health care provider.    2016, Elsevier/Gold Standard. (2013-09-17 14:16:23)  

## 2017-10-30 ENCOUNTER — Telehealth: Payer: Self-pay

## 2017-10-30 LAB — PROSTATE-SPECIFIC AG, SERUM (LABCORP): Prostate Specific Ag, Serum: <0.1 ng/mL (ref 0.0–4.0)

## 2017-10-30 NOTE — Telephone Encounter (Signed)
Contacted patient and made aware of results. 

## 2017-10-30 NOTE — Telephone Encounter (Signed)
-----   Message from [name], MD sent at 10/30/2017  8:01 AM EDT ----- Please let him know his PSA is very low

## 2017-11-06 DIAGNOSIS — E785 Hyperlipidemia, unspecified: Secondary | ICD-10-CM | POA: Diagnosis not present

## 2017-11-06 DIAGNOSIS — I10 Essential (primary) hypertension: Secondary | ICD-10-CM | POA: Diagnosis not present

## 2017-11-13 DIAGNOSIS — G4733 Obstructive sleep apnea (adult) (pediatric): Secondary | ICD-10-CM | POA: Diagnosis not present

## 2017-12-11 DIAGNOSIS — Z23 Encounter for immunization: Secondary | ICD-10-CM | POA: Diagnosis not present

## 2017-12-13 DIAGNOSIS — G4733 Obstructive sleep apnea (adult) (pediatric): Secondary | ICD-10-CM | POA: Diagnosis not present

## 2018-01-06 DIAGNOSIS — G4733 Obstructive sleep apnea (adult) (pediatric): Secondary | ICD-10-CM | POA: Diagnosis not present

## 2018-01-06 DIAGNOSIS — I10 Essential (primary) hypertension: Secondary | ICD-10-CM | POA: Diagnosis not present

## 2018-01-29 DIAGNOSIS — Z4502 Encounter for adjustment and management of automatic implantable cardiac defibrillator: Secondary | ICD-10-CM | POA: Diagnosis not present

## 2018-01-29 DIAGNOSIS — I255 Ischemic cardiomyopathy: Secondary | ICD-10-CM | POA: Diagnosis not present

## 2018-02-05 DIAGNOSIS — D638 Anemia in other chronic diseases classified elsewhere: Secondary | ICD-10-CM | POA: Diagnosis not present

## 2018-02-05 DIAGNOSIS — E785 Hyperlipidemia, unspecified: Secondary | ICD-10-CM | POA: Diagnosis not present

## 2018-02-05 DIAGNOSIS — I10 Essential (primary) hypertension: Secondary | ICD-10-CM | POA: Diagnosis not present

## 2018-02-05 DIAGNOSIS — K219 Gastro-esophageal reflux disease without esophagitis: Secondary | ICD-10-CM | POA: Diagnosis not present

## 2018-02-05 DIAGNOSIS — Z79899 Other long term (current) drug therapy: Secondary | ICD-10-CM | POA: Diagnosis not present

## 2018-02-27 DIAGNOSIS — G931 Anoxic brain damage, not elsewhere classified: Secondary | ICD-10-CM | POA: Diagnosis not present

## 2018-02-27 DIAGNOSIS — I2510 Atherosclerotic heart disease of native coronary artery without angina pectoris: Secondary | ICD-10-CM | POA: Diagnosis not present

## 2018-02-27 DIAGNOSIS — Z95810 Presence of automatic (implantable) cardiac defibrillator: Secondary | ICD-10-CM | POA: Diagnosis not present

## 2018-02-27 DIAGNOSIS — I469 Cardiac arrest, cause unspecified: Secondary | ICD-10-CM | POA: Diagnosis not present

## 2018-02-27 DIAGNOSIS — I255 Ischemic cardiomyopathy: Secondary | ICD-10-CM | POA: Diagnosis not present

## 2018-02-27 DIAGNOSIS — I4901 Ventricular fibrillation: Secondary | ICD-10-CM | POA: Diagnosis not present

## 2018-03-03 ENCOUNTER — Inpatient Hospital Stay: Payer: Medicare HMO | Attending: Oncology

## 2018-03-03 DIAGNOSIS — C61 Malignant neoplasm of prostate: Secondary | ICD-10-CM | POA: Diagnosis not present

## 2018-03-03 DIAGNOSIS — Z9079 Acquired absence of other genital organ(s): Secondary | ICD-10-CM | POA: Insufficient documentation

## 2018-03-03 DIAGNOSIS — K632 Fistula of intestine: Secondary | ICD-10-CM | POA: Diagnosis not present

## 2018-03-03 DIAGNOSIS — C775 Secondary and unspecified malignant neoplasm of intrapelvic lymph nodes: Secondary | ICD-10-CM | POA: Diagnosis not present

## 2018-03-03 LAB — CBC WITH DIFFERENTIAL (CANCER CENTER ONLY)
ABS IMMATURE GRANULOCYTES: 0.01 10*3/uL (ref 0.00–0.07)
Basophils Absolute: 0 10*3/uL (ref 0.0–0.1)
Basophils Relative: 1 %
Eosinophils Absolute: 0.6 10*3/uL — ABNORMAL HIGH (ref 0.0–0.5)
Eosinophils Relative: 13 %
HEMATOCRIT: 41.7 % (ref 39.0–52.0)
HEMOGLOBIN: 13.4 g/dL (ref 13.0–17.0)
Immature Granulocytes: 0 %
LYMPHS ABS: 1.4 10*3/uL (ref 0.7–4.0)
LYMPHS PCT: 30 %
MCH: 29.2 pg (ref 26.0–34.0)
MCHC: 32.1 g/dL (ref 30.0–36.0)
MCV: 90.8 fL (ref 80.0–100.0)
MONO ABS: 0.7 10*3/uL (ref 0.1–1.0)
MONOS PCT: 14 %
NEUTROS ABS: 2 10*3/uL (ref 1.7–7.7)
Neutrophils Relative %: 42 %
Platelet Count: 204 10*3/uL (ref 150–400)
RBC: 4.59 MIL/uL (ref 4.22–5.81)
RDW: 13.3 % (ref 11.5–15.5)
WBC Count: 4.7 10*3/uL (ref 4.0–10.5)
nRBC: 0 % (ref 0.0–0.2)

## 2018-03-03 LAB — CMP (CANCER CENTER ONLY)
ALBUMIN: 4 g/dL (ref 3.5–5.0)
ALT: 10 U/L (ref 0–44)
AST: 20 U/L (ref 15–41)
Alkaline Phosphatase: 57 U/L (ref 38–126)
Anion gap: 7 (ref 5–15)
BUN: 24 mg/dL — AB (ref 8–23)
CHLORIDE: 108 mmol/L (ref 98–111)
CO2: 28 mmol/L (ref 22–32)
Calcium: 9.3 mg/dL (ref 8.9–10.3)
Creatinine: 1.55 mg/dL — ABNORMAL HIGH (ref 0.61–1.24)
GFR, Est AFR Am: 50 mL/min — ABNORMAL LOW (ref >60)
GFR, Estimated: 43 mL/min — ABNORMAL LOW (ref >60)
GLUCOSE: 112 mg/dL — AB (ref 70–99)
Potassium: 5.2 mmol/L — ABNORMAL HIGH (ref 3.5–5.1)
Sodium: 143 mmol/L (ref 135–145)
Total Bilirubin: 0.6 mg/dL (ref 0.3–1.2)
Total Protein: 6.9 g/dL (ref 6.5–8.1)

## 2018-03-04 ENCOUNTER — Telehealth: Payer: Self-pay | Admitting: Oncology

## 2018-03-04 ENCOUNTER — Inpatient Hospital Stay (HOSPITAL_BASED_OUTPATIENT_CLINIC_OR_DEPARTMENT_OTHER): Payer: Medicare HMO | Admitting: Oncology

## 2018-03-04 VITALS — BP 129/66 | HR 86 | Temp 98.0°F | Resp 18 | Ht 66.0 in | Wt 168.7 lb

## 2018-03-04 DIAGNOSIS — C775 Secondary and unspecified malignant neoplasm of intrapelvic lymph nodes: Secondary | ICD-10-CM | POA: Diagnosis not present

## 2018-03-04 DIAGNOSIS — K632 Fistula of intestine: Secondary | ICD-10-CM

## 2018-03-04 DIAGNOSIS — C61 Malignant neoplasm of prostate: Secondary | ICD-10-CM | POA: Diagnosis not present

## 2018-03-04 DIAGNOSIS — Z9079 Acquired absence of other genital organ(s): Secondary | ICD-10-CM | POA: Diagnosis not present

## 2018-03-04 LAB — PROSTATE-SPECIFIC AG, SERUM (LABCORP)

## 2018-03-04 NOTE — Telephone Encounter (Signed)
Gave avs and calendar ° °

## 2018-03-04 NOTE — Progress Notes (Signed)
Hematology and Oncology Follow Up Visit  SAMARI GORBY [id number] [birth year] 78 y.o. 03/04/2018 11:42 AM [address], [name], MDScott, [name], MD   Principle Diagnosis: 63-year man with locally advanced prostate cancer diagnosed in December 2016 and subsequently developed relapsed disease in 2017 with Gleason score 5+4 = 9 and a PSA of 12.22.     Prior Therapy:  He is status post radical prostatectomy done on 03/03/2015 utilizing a nerve sparing approach. 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 Tumor involves bilateral seminal vesicles with margins involved with cancer. 2 lymph nodes were involved for a total of 8 sampled.    He developed biochemical relapse in July 2017 with a PSA of 1.28.  Lupron 30 mg injection given every 4 months first dose given on 08/08/2015 he completed 2 years of therapy in 2019.  Current therapy:   Interim History: Mr. Greth is here for a follow-up.  Since the last visit, he reports no major changes in his health.  He continues to feel well without any recent signs or symptoms of cancer recurrence.  He denies any abdominal pain, early satiety or bone pain.  He denies any urinary changes or changes in bowel habits.  He denies any bone pain or pathological fractures.  His performance status and quality of life remain excellent.  Patient denied any alteration mental status, neuropathy, confusion or dizziness.  Denies any headaches or lethargy.  Denies any night sweats, weight loss or changes in appetite.  Denied orthopnea, dyspnea on exertion or chest discomfort.  Denies shortness of breath, difficulty breathing hemoptysis or cough.  Denies any abdominal distention, nausea, early satiety or dyspepsia.  Denies any hematuria, frequency, dysuria or nocturia.  Denies any skin irritation, dryness or rash.  Denies any ecchymosis or petechiae.  Denies any lymphadenopathy or clotting.  Denies any heat or cold intolerance.  Denies any  anxiety or depression.  Remaining review of system is negative.    Medications: I have reviewed the patient's current medications.  Current Outpatient Medications  Medication Sig Dispense Refill  . acetaminophen (TYLENOL) 500 MG tablet Take 500 mg by mouth every 6 (six) hours as needed (for pain.).    Marland Kitchen amiodarone (PACERONE) 200 MG tablet Take 200 mg by mouth daily. 0.5 tab (100 mg total) po daily    . aspirin 81 MG tablet Take 81 mg by mouth daily.    . carvedilol (COREG) 12.5 MG tablet Take 6.25 mg by mouth 2 (two) times daily with a meal.     . famotidine (PEPCID) 40 MG tablet Take 40 mg at bedtime by mouth.    . losartan (COZAAR) 50 MG tablet Take 50 mg by mouth daily.    Marland Kitchen lovastatin (MEVACOR) 40 MG tablet Take 40 mg at bedtime by mouth.    . nitroGLYCERIN (NITROSTAT) 0.4 MG SL tablet Place 0.4 mg under the tongue every 5 (five) minutes as needed for chest pain.      No current facility-administered medications for this visit.      Allergies: No Known Allergies  Past Medical History, Surgical history, Social history, and Family History were reviewed and updated.   Physical Exam:  Blood pressure 129/66, pulse 86, temperature 98 F (36.7 C), temperature source Oral, resp. rate 18, height 5\' 6"  (1.676 m), weight 168 lb 11.2 oz (76.5 kg), SpO2 98 %.    ECOG: 0   General appearance: Alert, awake without any distress. Head: Atraumatic without abnormalities Oropharynx: Without any thrush  or ulcers. Eyes: No scleral icterus. Lymph nodes: No lymphadenopathy noted in the cervical, supraclavicular, or axillary nodes Heart:regular rate and rhythm, without any murmurs or gallops.   Lung: Clear to auscultation without any rhonchi, wheezes or dullness to percussion. Abdomin: Soft, nontender without any shifting dullness or ascites. Musculoskeletal: No clubbing or cyanosis. Neurological: No motor or sensory deficits. Skin: No rashes or lesions. Psychiatric: Mood and affect appeared  normal.     Lab Results: Lab Results  Component Value Date   WBC 4.7 03/03/2018   HGB 13.4 03/03/2018   HCT 41.7 03/03/2018   MCV 90.8 03/03/2018   PLT 204 03/03/2018     Chemistry      Component Value Date/Time   NA 143 03/03/2018 1044   NA 140 11/27/2016 1107   K 5.2 (H) 03/03/2018 1044   K 4.4 11/27/2016 1107   CL 108 03/03/2018 1044   CO2 28 03/03/2018 1044   CO2 27 11/27/2016 1107   BUN 24 (H) 03/03/2018 1044   BUN 18.9 11/27/2016 1107   CREATININE 1.55 (H) 03/03/2018 1044   CREATININE 1.2 11/27/2016 1107      Component Value Date/Time   CALCIUM 9.3 03/03/2018 1044   CALCIUM 9.0 11/27/2016 1107   ALKPHOS 57 03/03/2018 1044   ALKPHOS 72 11/27/2016 1107   AST 20 03/03/2018 1044   AST 21 11/27/2016 1107   ALT 10 03/03/2018 1044   ALT 19 11/27/2016 1107   BILITOT 0.6 03/03/2018 1044   BILITOT 0.42 11/27/2016 1107       Results for DEKARI, BURES (MRN [id number]) as of 03/04/2018 11:45  Ref. Range 10/29/2017 09:51 03/03/2018 10:44  Prostate Specific Ag, Serum Latest Ref Range: 0.0 - 4.0 ng/mL <0.1 <0.1     Impression and Plan:   78 year old man with:  1.  Prostate cancer diagnosed in 2016 and subsequently developed biochemical relapse in 2017.   His last Lupron given on October 23 of 2019 and his PSA remains undetectable.  The natural course of this disease and treatment options were reviewed moving forward.  Risks and benefits of resuming Lupron was also discussed.  At this time, he opted to continue with intermittent androgen deprivation and will repeat PSA periodically.  He understands restarting Lupron may be needed if his PSA starts to rise again.  2. Bone health: I recommended continuing calcium and vitamin D supplements given his exposure to androgen deprivation.  3.  Colonic fistula: Repaired in 2019.  No evidence of issues at this time.  4.  Follow-up: In 4 months for repeat assessment.  15  minutes was spent with the patient face-to-face  today.  More than 50% of time was dedicated to reviewing his disease status, reviewing laboratory data and answering questions regarding future plan of care.   2/26/202011:42 AM e

## 2018-03-07 DIAGNOSIS — E785 Hyperlipidemia, unspecified: Secondary | ICD-10-CM | POA: Diagnosis not present

## 2018-03-07 DIAGNOSIS — G4733 Obstructive sleep apnea (adult) (pediatric): Secondary | ICD-10-CM | POA: Diagnosis not present

## 2018-03-07 DIAGNOSIS — D638 Anemia in other chronic diseases classified elsewhere: Secondary | ICD-10-CM | POA: Diagnosis not present

## 2018-03-12 DIAGNOSIS — Z95810 Presence of automatic (implantable) cardiac defibrillator: Secondary | ICD-10-CM | POA: Diagnosis not present

## 2018-03-16 DIAGNOSIS — I420 Dilated cardiomyopathy: Secondary | ICD-10-CM | POA: Diagnosis not present

## 2018-03-16 DIAGNOSIS — J40 Bronchitis, not specified as acute or chronic: Secondary | ICD-10-CM | POA: Diagnosis not present

## 2018-03-16 DIAGNOSIS — I255 Ischemic cardiomyopathy: Secondary | ICD-10-CM | POA: Diagnosis not present

## 2018-03-16 DIAGNOSIS — Z95810 Presence of automatic (implantable) cardiac defibrillator: Secondary | ICD-10-CM | POA: Diagnosis not present

## 2018-03-16 DIAGNOSIS — C61 Malignant neoplasm of prostate: Secondary | ICD-10-CM | POA: Diagnosis not present

## 2018-03-16 DIAGNOSIS — Z1331 Encounter for screening for depression: Secondary | ICD-10-CM | POA: Diagnosis not present

## 2018-03-16 DIAGNOSIS — E663 Overweight: Secondary | ICD-10-CM | POA: Diagnosis not present

## 2018-03-16 DIAGNOSIS — Z6826 Body mass index (BMI) 26.0-26.9, adult: Secondary | ICD-10-CM | POA: Diagnosis not present

## 2018-03-16 DIAGNOSIS — M542 Cervicalgia: Secondary | ICD-10-CM | POA: Diagnosis not present

## 2018-04-07 DIAGNOSIS — E785 Hyperlipidemia, unspecified: Secondary | ICD-10-CM | POA: Diagnosis not present

## 2018-04-07 DIAGNOSIS — I10 Essential (primary) hypertension: Secondary | ICD-10-CM | POA: Diagnosis not present

## 2018-06-11 DIAGNOSIS — Z95810 Presence of automatic (implantable) cardiac defibrillator: Secondary | ICD-10-CM | POA: Diagnosis not present

## 2018-07-01 ENCOUNTER — Ambulatory Visit: Payer: Medicare HMO | Admitting: Oncology

## 2018-07-01 ENCOUNTER — Other Ambulatory Visit: Payer: Medicare HMO

## 2018-08-04 DIAGNOSIS — E785 Hyperlipidemia, unspecified: Secondary | ICD-10-CM | POA: Diagnosis not present

## 2018-08-07 DIAGNOSIS — E785 Hyperlipidemia, unspecified: Secondary | ICD-10-CM | POA: Diagnosis not present

## 2018-08-07 DIAGNOSIS — I10 Essential (primary) hypertension: Secondary | ICD-10-CM | POA: Diagnosis not present

## 2018-08-07 DIAGNOSIS — I255 Ischemic cardiomyopathy: Secondary | ICD-10-CM | POA: Diagnosis not present

## 2018-09-03 ENCOUNTER — Telehealth: Payer: Self-pay | Admitting: Oncology

## 2018-09-03 NOTE — Telephone Encounter (Signed)
Patient called regarding rescheduling a cancelled appointment on 06/24. Lab and follow up has been moved to 08/28 per patient's request.  Message to provider.

## 2018-09-04 ENCOUNTER — Inpatient Hospital Stay: Payer: Medicare HMO | Attending: Oncology

## 2018-09-04 ENCOUNTER — Other Ambulatory Visit: Payer: Self-pay

## 2018-09-04 ENCOUNTER — Inpatient Hospital Stay (HOSPITAL_BASED_OUTPATIENT_CLINIC_OR_DEPARTMENT_OTHER): Payer: Medicare HMO | Admitting: Oncology

## 2018-09-04 VITALS — BP 117/85 | HR 83 | Temp 98.3°F | Resp 17 | Wt 163.4 lb

## 2018-09-04 DIAGNOSIS — M818 Other osteoporosis without current pathological fracture: Secondary | ICD-10-CM | POA: Insufficient documentation

## 2018-09-04 DIAGNOSIS — C61 Malignant neoplasm of prostate: Secondary | ICD-10-CM | POA: Diagnosis not present

## 2018-09-04 LAB — CMP (CANCER CENTER ONLY)
ALT: 11 U/L (ref 0–44)
AST: 20 U/L (ref 15–41)
Albumin: 4.1 g/dL (ref 3.5–5.0)
Alkaline Phosphatase: 56 U/L (ref 38–126)
Anion gap: 9 (ref 5–15)
BUN: 22 mg/dL (ref 8–23)
CO2: 23 mmol/L (ref 22–32)
Calcium: 8.9 mg/dL (ref 8.9–10.3)
Chloride: 108 mmol/L (ref 98–111)
Creatinine: 1.42 mg/dL — ABNORMAL HIGH (ref 0.61–1.24)
GFR, Est AFR Am: 56 mL/min — ABNORMAL LOW (ref >60)
GFR, Estimated: 48 mL/min — ABNORMAL LOW (ref >60)
Glucose, Bld: 102 mg/dL — ABNORMAL HIGH (ref 70–99)
Potassium: 4.7 mmol/L (ref 3.5–5.1)
Sodium: 140 mmol/L (ref 135–145)
Total Bilirubin: 0.7 mg/dL (ref 0.3–1.2)
Total Protein: 6.9 g/dL (ref 6.5–8.1)

## 2018-09-04 LAB — CBC WITH DIFFERENTIAL (CANCER CENTER ONLY)
Abs Immature Granulocytes: 0.01 10*3/uL (ref 0.00–0.07)
Basophils Absolute: 0 10*3/uL (ref 0.0–0.1)
Basophils Relative: 1 %
Eosinophils Absolute: 0.7 10*3/uL — ABNORMAL HIGH (ref 0.0–0.5)
Eosinophils Relative: 13 %
HCT: 40.7 % (ref 39.0–52.0)
Hemoglobin: 13.2 g/dL (ref 13.0–17.0)
Immature Granulocytes: 0 %
Lymphocytes Relative: 26 %
Lymphs Abs: 1.4 10*3/uL (ref 0.7–4.0)
MCH: 28.9 pg (ref 26.0–34.0)
MCHC: 32.4 g/dL (ref 30.0–36.0)
MCV: 89.3 fL (ref 80.0–100.0)
Monocytes Absolute: 0.7 10*3/uL (ref 0.1–1.0)
Monocytes Relative: 12 %
Neutro Abs: 2.6 10*3/uL (ref 1.7–7.7)
Neutrophils Relative %: 48 %
Platelet Count: 243 10*3/uL (ref 150–400)
RBC: 4.56 MIL/uL (ref 4.22–5.81)
RDW: 13.2 % (ref 11.5–15.5)
WBC Count: 5.4 10*3/uL (ref 4.0–10.5)
nRBC: 0 % (ref 0.0–0.2)

## 2018-09-04 NOTE — Progress Notes (Signed)
Hematology and Oncology Follow Up Visit  Jesse Mann [id number] Feb 17, 1940 78 y.o. 09/04/2018 8:22 AM [address], [name], MDStreet, [name], *   Principle Diagnosis: 72-year man with prostate cancer diagnosed in 2016 with Gleason score 4+4 equals normal PSA follow-up.  He developed biochemical relapse in July 2017.   Prior Therapy:  He is status post radical prostatectomy done on 03/03/2015 utilizing a nerve sparing approach. 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 Tumor involves bilateral seminal vesicles with margins involved with cancer. 2 lymph nodes were involved for a total of 8 sampled.    He developed biochemical relapse in July 2017 with a PSA of 1.28.  Lupron 30 mg injection given every 4 months first dose given on 08/08/2015 he completed 2 years of therapy in 2019.  Current therapy:   Interim History: Jesse Mann returns today for a repeat evaluation.  Since the last visit, he reports no major changes in his health.  He does report intermittent hot flashes but have reduced significantly at this time.  He denies any bone pain or pathological fractures.  He is eating well and attends activities of daily living.  He denies any urinary symptoms.  He denies any hospitalization or illnesses.   He denied headaches, blurry vision, syncope or seizures.  Denies any fevers, chills or sweats.  Denied chest pain, palpitation, orthopnea or leg edema.  Denied cough, wheezing or hemoptysis.  Denied nausea, vomiting or abdominal pain.  Denies any constipation or diarrhea.  Denies any frequency urgency or hesitancy.  Denies any arthralgias or myalgias.  Denies any skin rashes or lesions.  Denies any bleeding or clotting tendency.  Denies any easy bruising.  Denies any hair or nail changes.  Denies any anxiety or depression.  Remaining review of system is negative.      Medications: Updated on review. Current Outpatient Medications  Medication  Sig Dispense Refill  . acetaminophen (TYLENOL) 500 MG tablet Take 500 mg by mouth every 6 (six) hours as needed (for pain.).    Marland Kitchen amiodarone (PACERONE) 200 MG tablet Take 200 mg by mouth daily. 0.5 tab (100 mg total) po daily    . aspirin 81 MG tablet Take 81 mg by mouth daily.    . carvedilol (COREG) 12.5 MG tablet Take 6.25 mg by mouth 2 (two) times daily with a meal.     . famotidine (PEPCID) 40 MG tablet Take 40 mg at bedtime by mouth.    . losartan (COZAAR) 50 MG tablet Take 50 mg by mouth daily.    Marland Kitchen lovastatin (MEVACOR) 40 MG tablet Take 40 mg at bedtime by mouth.    . nitroGLYCERIN (NITROSTAT) 0.4 MG SL tablet Place 0.4 mg under the tongue every 5 (five) minutes as needed for chest pain.      No current facility-administered medications for this visit.      Allergies: No Known Allergies  Past Medical History, Surgical history, Social history, and Family History without any changes after review.   Physical Exam:  Blood pressure 117/85, pulse 83, temperature 98.3 F (36.8 C), temperature source Temporal, resp. rate 17, weight 163 lb 6.4 oz (74.1 kg), SpO2 98 %.     ECOG: 0   General appearance: Comfortable appearing without any discomfort Head: Normocephalic without any trauma Oropharynx: Mucous membranes are moist and pink without any thrush or ulcers. Eyes: Pupils are equal and round reactive to light. Lymph nodes: No cervical, supraclavicular, inguinal or axillary lymphadenopathy.  Heart:regular rate and rhythm.  S1 and S2 without leg edema. Lung: Clear without any rhonchi or wheezes.  No dullness to percussion. Abdomin: Soft, nontender, nondistended with good bowel sounds.  No hepatosplenomegaly. Musculoskeletal: No joint deformity or effusion.  Full range of motion noted. Neurological: No deficits noted on motor, sensory and deep tendon reflex exam. Skin: No petechial rash or dryness.  Appeared moist.       Lab Results: Lab Results  Component Value Date    WBC 4.7 03/03/2018   HGB 13.4 03/03/2018   HCT 41.7 03/03/2018   MCV 90.8 03/03/2018   PLT 204 03/03/2018     Chemistry      Component Value Date/Time   NA 143 03/03/2018 1044   NA 140 11/27/2016 1107   K 5.2 (H) 03/03/2018 1044   K 4.4 11/27/2016 1107   CL 108 03/03/2018 1044   CO2 28 03/03/2018 1044   CO2 27 11/27/2016 1107   BUN 24 (H) 03/03/2018 1044   BUN 18.9 11/27/2016 1107   CREATININE 1.55 (H) 03/03/2018 1044   CREATININE 1.2 11/27/2016 1107      Component Value Date/Time   CALCIUM 9.3 03/03/2018 1044   CALCIUM 9.0 11/27/2016 1107   ALKPHOS 57 03/03/2018 1044   ALKPHOS 72 11/27/2016 1107   AST 20 03/03/2018 1044   AST 21 11/27/2016 1107   ALT 10 03/03/2018 1044   ALT 19 11/27/2016 1107   BILITOT 0.6 03/03/2018 1044   BILITOT 0.42 11/27/2016 1107       Results for Jesse Mann (MRN [id number]) as of 09/04/2018 08:23  Ref. Range 10/29/2017 09:51 03/03/2018 10:44  Prostate Specific Ag, Serum Latest Ref Range: 0.0 - 4.0 ng/mL <0.1 <0.1      Impression and Plan:   78 year old man with:  1.  Biochemically recurrent prostate cancer documented in 2017 after initial diagnosis in 2016.     He is currently receiving intermittent androgen deprivation therapy and last injection was in October 2019.  His PSA remains undetectable without any evidence to suggest relapsed disease.  The natural course of this disease was discussed as well as risk of relapse and indication to restart androgen deprivation was reviewed.  At this time we have elected to continue with intermittent androgen deprivation and receive Lupron if he develops PSA rise.  He is agreeable with this plan.  2. Bone health: He is to continue vitamin D and calcium supplements for osteoporosis prevention.   3.  Follow-up: Repeat evaluation in 6 months.  15  minutes was spent with the patient face-to-face today.  More than 50% of time was spent on updating his disease status, treatment options and  answering questions regarding future plan of care.  8/28/20208:22 AM

## 2018-09-05 LAB — PROSTATE-SPECIFIC AG, SERUM (LABCORP): Prostate Specific Ag, Serum: <0.1 ng/mL (ref 0.0–4.0)

## 2018-09-07 ENCOUNTER — Telehealth: Payer: Self-pay

## 2018-09-07 ENCOUNTER — Telehealth: Payer: Self-pay | Admitting: Oncology

## 2018-09-07 DIAGNOSIS — E785 Hyperlipidemia, unspecified: Secondary | ICD-10-CM | POA: Diagnosis not present

## 2018-09-07 DIAGNOSIS — I10 Essential (primary) hypertension: Secondary | ICD-10-CM | POA: Diagnosis not present

## 2018-09-07 NOTE — Telephone Encounter (Signed)
-----   Message from [name], MD sent at 09/07/2018  9:09 AM EDT ----- Please let him know his PSA is low.

## 2018-09-07 NOTE — Telephone Encounter (Signed)
Called patient and let him know that per Dr. Alen Blew, PSA is low. PSA is less than 0.1. Patient verbalized understanding.

## 2018-09-07 NOTE — Telephone Encounter (Signed)
Called and spoke with patient. Confirmed appt  °

## 2018-09-10 DIAGNOSIS — Z95810 Presence of automatic (implantable) cardiac defibrillator: Secondary | ICD-10-CM | POA: Diagnosis not present

## 2018-10-03 DIAGNOSIS — Z23 Encounter for immunization: Secondary | ICD-10-CM | POA: Diagnosis not present

## 2018-10-13 DIAGNOSIS — Z95810 Presence of automatic (implantable) cardiac defibrillator: Secondary | ICD-10-CM | POA: Diagnosis not present

## 2018-10-13 DIAGNOSIS — E7800 Pure hypercholesterolemia, unspecified: Secondary | ICD-10-CM | POA: Diagnosis not present

## 2018-10-13 DIAGNOSIS — I255 Ischemic cardiomyopathy: Secondary | ICD-10-CM | POA: Diagnosis not present

## 2018-10-13 DIAGNOSIS — I509 Heart failure, unspecified: Secondary | ICD-10-CM | POA: Diagnosis not present

## 2018-10-13 DIAGNOSIS — I2510 Atherosclerotic heart disease of native coronary artery without angina pectoris: Secondary | ICD-10-CM | POA: Diagnosis not present

## 2018-11-06 DIAGNOSIS — I10 Essential (primary) hypertension: Secondary | ICD-10-CM | POA: Diagnosis not present

## 2018-11-06 DIAGNOSIS — K219 Gastro-esophageal reflux disease without esophagitis: Secondary | ICD-10-CM | POA: Diagnosis not present

## 2018-12-10 DIAGNOSIS — Z95810 Presence of automatic (implantable) cardiac defibrillator: Secondary | ICD-10-CM | POA: Diagnosis not present

## 2019-01-06 DIAGNOSIS — Z125 Encounter for screening for malignant neoplasm of prostate: Secondary | ICD-10-CM | POA: Diagnosis not present

## 2019-01-06 DIAGNOSIS — Z79899 Other long term (current) drug therapy: Secondary | ICD-10-CM | POA: Diagnosis not present

## 2019-01-06 DIAGNOSIS — I10 Essential (primary) hypertension: Secondary | ICD-10-CM | POA: Diagnosis not present

## 2019-01-06 DIAGNOSIS — E785 Hyperlipidemia, unspecified: Secondary | ICD-10-CM | POA: Diagnosis not present

## 2019-01-14 DIAGNOSIS — C61 Malignant neoplasm of prostate: Secondary | ICD-10-CM | POA: Diagnosis not present

## 2019-01-14 DIAGNOSIS — I255 Ischemic cardiomyopathy: Secondary | ICD-10-CM | POA: Diagnosis not present

## 2019-01-14 DIAGNOSIS — Z95810 Presence of automatic (implantable) cardiac defibrillator: Secondary | ICD-10-CM | POA: Diagnosis not present

## 2019-01-14 DIAGNOSIS — K219 Gastro-esophageal reflux disease without esophagitis: Secondary | ICD-10-CM | POA: Diagnosis not present

## 2019-01-14 DIAGNOSIS — I420 Dilated cardiomyopathy: Secondary | ICD-10-CM | POA: Diagnosis not present

## 2019-01-14 DIAGNOSIS — Z0000 Encounter for general adult medical examination without abnormal findings: Secondary | ICD-10-CM | POA: Diagnosis not present

## 2019-01-14 DIAGNOSIS — E785 Hyperlipidemia, unspecified: Secondary | ICD-10-CM | POA: Diagnosis not present

## 2019-01-14 DIAGNOSIS — I2510 Atherosclerotic heart disease of native coronary artery without angina pectoris: Secondary | ICD-10-CM | POA: Diagnosis not present

## 2019-01-14 DIAGNOSIS — I10 Essential (primary) hypertension: Secondary | ICD-10-CM | POA: Diagnosis not present

## 2019-02-04 ENCOUNTER — Other Ambulatory Visit: Payer: Self-pay

## 2019-02-04 ENCOUNTER — Inpatient Hospital Stay: Payer: Medicare HMO

## 2019-02-04 ENCOUNTER — Inpatient Hospital Stay: Payer: Medicare HMO | Attending: Oncology | Admitting: Oncology

## 2019-02-04 VITALS — BP 119/79 | HR 84 | Temp 97.9°F | Resp 17 | Ht 66.0 in | Wt 168.1 lb

## 2019-02-04 DIAGNOSIS — C61 Malignant neoplasm of prostate: Secondary | ICD-10-CM

## 2019-02-04 DIAGNOSIS — C779 Secondary and unspecified malignant neoplasm of lymph node, unspecified: Secondary | ICD-10-CM | POA: Diagnosis not present

## 2019-02-04 DIAGNOSIS — Z79899 Other long term (current) drug therapy: Secondary | ICD-10-CM | POA: Diagnosis not present

## 2019-02-04 LAB — CBC WITH DIFFERENTIAL (CANCER CENTER ONLY)
Abs Immature Granulocytes: 0.01 10*3/uL (ref 0.00–0.07)
Basophils Absolute: 0 10*3/uL (ref 0.0–0.1)
Basophils Relative: 1 %
Eosinophils Absolute: 0.9 10*3/uL — ABNORMAL HIGH (ref 0.0–0.5)
Eosinophils Relative: 15 %
HCT: 40.1 % (ref 39.0–52.0)
Hemoglobin: 13.2 g/dL (ref 13.0–17.0)
Immature Granulocytes: 0 %
Lymphocytes Relative: 27 %
Lymphs Abs: 1.5 10*3/uL (ref 0.7–4.0)
MCH: 29.5 pg (ref 26.0–34.0)
MCHC: 32.9 g/dL (ref 30.0–36.0)
MCV: 89.7 fL (ref 80.0–100.0)
Monocytes Absolute: 0.7 10*3/uL (ref 0.1–1.0)
Monocytes Relative: 13 %
Neutro Abs: 2.5 10*3/uL (ref 1.7–7.7)
Neutrophils Relative %: 44 %
Platelet Count: 227 10*3/uL (ref 150–400)
RBC: 4.47 MIL/uL (ref 4.22–5.81)
RDW: 13.2 % (ref 11.5–15.5)
WBC Count: 5.7 10*3/uL (ref 4.0–10.5)
nRBC: 0 % (ref 0.0–0.2)

## 2019-02-04 LAB — CMP (CANCER CENTER ONLY)
ALT: 9 U/L (ref 0–44)
AST: 17 U/L (ref 15–41)
Albumin: 4 g/dL (ref 3.5–5.0)
Alkaline Phosphatase: 60 U/L (ref 38–126)
Anion gap: 7 (ref 5–15)
BUN: 23 mg/dL (ref 8–23)
CO2: 26 mmol/L (ref 22–32)
Calcium: 8.8 mg/dL — ABNORMAL LOW (ref 8.9–10.3)
Chloride: 108 mmol/L (ref 98–111)
Creatinine: 1.41 mg/dL — ABNORMAL HIGH (ref 0.61–1.24)
GFR, Est AFR Am: 56 mL/min — ABNORMAL LOW (ref >60)
GFR, Estimated: 48 mL/min — ABNORMAL LOW (ref >60)
Glucose, Bld: 103 mg/dL — ABNORMAL HIGH (ref 70–99)
Potassium: 4.2 mmol/L (ref 3.5–5.1)
Sodium: 141 mmol/L (ref 135–145)
Total Bilirubin: 0.7 mg/dL (ref 0.3–1.2)
Total Protein: 6.6 g/dL (ref 6.5–8.1)

## 2019-02-04 NOTE — Progress Notes (Signed)
Hematology and Oncology Follow Up Visit  KENWARD QUINATA [id number] 05-Feb-1940 79 y.o. 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  He was initially diagnosed in 2016 with Gleason score 4+4 = 8.    Prior Therapy:  He is status post radical prostatectomy done on 03/03/2015 utilizing a nerve sparing approach. 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 Tumor involves bilateral seminal vesicles with margins involved with cancer. 2 lymph nodes were involved for a total of 8 sampled.    He developed biochemical relapse in July 2017 with a PSA of 1.28. Daily Lupron 30 mg injection given every 4 months first dose given on 08/08/2015 he completed 2 years of therapy in 2019.  Current therapy: Active surveillance.  He has been receiving intermittent androgen deprivation therapy with last Lupron given in October 2019.  Interim History: Mr. Scollon is here for a follow-up visit.  Since her last visit, he reports no major changes in his health.  Continues to be active and attends activities of daily living.  He denies any bone pain or pathological fractures.  Denies any recent hospitalizations or illnesses.  His performance status quality of life remains excellent.    Medications: Updated on review. Current Outpatient Medications  Medication Sig Dispense Refill  . acetaminophen (TYLENOL) 500 MG tablet Take 500 mg by mouth every 6 (six) hours as needed (for pain.).    Marland Kitchen amiodarone (PACERONE) 200 MG tablet Take 200 mg by mouth daily. 0.5 tab (100 mg total) po daily    . aspirin 81 MG tablet Take 81 mg by mouth daily.    . carvedilol (COREG) 12.5 MG tablet Take 6.25 mg by mouth 2 (two) times daily with a meal.     . famotidine (PEPCID) 40 MG tablet Take 40 mg at bedtime by mouth.    . losartan (COZAAR) 50 MG tablet Take 50 mg by  mouth daily.    . nitroGLYCERIN (NITROSTAT) 0.4 MG SL tablet Place 0.4 mg under the tongue every 5 (five) minutes as needed for chest pain.     . rosuvastatin (CRESTOR) 10 MG tablet Take 1 tablet by mouth daily.     No current facility-administered medications for this visit.     Allergies: No Known Allergies     Physical Exam:   Blood pressure 119/79, pulse 84, temperature 97.9 F (36.6 C), temperature source Temporal, resp. rate 17, height 5\' 6"  (1.676 m), weight 168 lb 1.6 oz (76.2 kg), SpO2 100 %.     ECOG: 0    General appearance: Alert, awake without any distress. Head: Atraumatic without abnormalities Oropharynx: Without any thrush or ulcers. Eyes: No scleral icterus. Lymph nodes: No lymphadenopathy noted in the cervical, supraclavicular, or axillary nodes Heart:regular rate and rhythm, without any murmurs or gallops.   Lung: Clear to auscultation without any rhonchi, wheezes or dullness to percussion. Abdomin: Soft, nontender without any shifting dullness or ascites. Musculoskeletal: No clubbing or cyanosis. Neurological: No motor or sensory deficits. Skin: No rashes or lesions.       Lab Results: Lab Results  Component Value Date   WBC 5.4 09/04/2018   HGB 13.2 09/04/2018   HCT 40.7 09/04/2018   MCV 89.3 09/04/2018   PLT 243 09/04/2018     Chemistry      Component Value Date/Time   NA 140 09/04/2018 0817   NA 140 11/27/2016  1107   K 4.7 09/04/2018 0817   K 4.4 11/27/2016 1107   CL 108 09/04/2018 0817   CO2 23 09/04/2018 0817   CO2 27 11/27/2016 1107   BUN 22 09/04/2018 0817   BUN 18.9 11/27/2016 1107   CREATININE 1.42 (H) 09/04/2018 0817   CREATININE 1.2 11/27/2016 1107      Component Value Date/Time   CALCIUM 8.9 09/04/2018 0817   CALCIUM 9.0 11/27/2016 1107   ALKPHOS 56 09/04/2018 0817   ALKPHOS 72 11/27/2016 1107   AST 20 09/04/2018 0817   AST 21 11/27/2016 1107   ALT 11 09/04/2018 0817   ALT 19 11/27/2016 1107   BILITOT 0.7  09/04/2018 0817   BILITOT 0.42 11/27/2016 1107        Results for GAYLAND, SIVERLING (MRN [id number]) as of 02/04/2019 09:53  Ref. Range 03/03/2018 10:44 09/04/2018 08:17  Prostate Specific Ag, Serum Latest Ref Range: 0.0 - 4.0 ng/mL <0.1 <0.1      Impression and Plan:   79 year old man with:  1.  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       He is currently on active surveillance with restarting of androgen deprivation only if his PSA rises.  His last PSA remains undetectable in August 2020.  The natural course of his disease as well as risks and benefits of restarting androgen deprivation therapy was discussed.  Potential complications that include weight gain, hot flashes and sexual dysfunction were reviewed.  At this time I recommended continued active surveillance and restart if his PSA starts to rise.  He is agreeable with this plan.  2. Bone health: I recommended continued calcium and vitamin D supplements.  He is at risk of developing osteoporosis because of his age and androgen deprivation.   3.  Follow-up: In 6 months for a follow-up.  20  minutes was dedicated to this encounter.  The time was spent on reviewing laboratory data, disease status update as well as answering questions regarding future plan of care.  1/28/20219:51 AM

## 2019-02-05 ENCOUNTER — Telehealth: Payer: Self-pay

## 2019-02-05 ENCOUNTER — Telehealth: Payer: Self-pay | Admitting: Oncology

## 2019-02-05 LAB — PROSTATE-SPECIFIC AG, SERUM (LABCORP): Prostate Specific Ag, Serum: <0.1 ng/mL (ref 0.0–4.0)

## 2019-02-05 NOTE — Telephone Encounter (Signed)
-----   Message from Jesse Portela, MD sent at 02/05/2019  8:31 AM EST ----- Please let him know his PSA is low

## 2019-02-05 NOTE — Telephone Encounter (Signed)
Called patient and made him aware of PSA result. Patient verbalized understanding.  

## 2019-02-05 NOTE — Telephone Encounter (Signed)
Scheduled appt per 1/28 los.  Sent a message to HIM pool to get a calendar mailed out. 

## 2019-02-06 DIAGNOSIS — K219 Gastro-esophageal reflux disease without esophagitis: Secondary | ICD-10-CM | POA: Diagnosis not present

## 2019-02-06 DIAGNOSIS — I10 Essential (primary) hypertension: Secondary | ICD-10-CM | POA: Diagnosis not present

## 2019-02-06 DIAGNOSIS — E785 Hyperlipidemia, unspecified: Secondary | ICD-10-CM | POA: Diagnosis not present

## 2019-02-18 DIAGNOSIS — I255 Ischemic cardiomyopathy: Secondary | ICD-10-CM | POA: Diagnosis not present

## 2019-02-18 DIAGNOSIS — Z4502 Encounter for adjustment and management of automatic implantable cardiac defibrillator: Secondary | ICD-10-CM | POA: Diagnosis not present

## 2019-04-07 DIAGNOSIS — I10 Essential (primary) hypertension: Secondary | ICD-10-CM | POA: Diagnosis not present

## 2019-04-07 DIAGNOSIS — I2510 Atherosclerotic heart disease of native coronary artery without angina pectoris: Secondary | ICD-10-CM | POA: Diagnosis not present

## 2019-04-20 DIAGNOSIS — R5383 Other fatigue: Secondary | ICD-10-CM | POA: Diagnosis not present

## 2019-04-20 DIAGNOSIS — I255 Ischemic cardiomyopathy: Secondary | ICD-10-CM | POA: Diagnosis not present

## 2019-04-20 DIAGNOSIS — I2510 Atherosclerotic heart disease of native coronary artery without angina pectoris: Secondary | ICD-10-CM | POA: Diagnosis not present

## 2019-04-20 DIAGNOSIS — E7800 Pure hypercholesterolemia, unspecified: Secondary | ICD-10-CM | POA: Diagnosis not present

## 2019-04-20 DIAGNOSIS — I509 Heart failure, unspecified: Secondary | ICD-10-CM | POA: Diagnosis not present

## 2019-04-20 DIAGNOSIS — Z95810 Presence of automatic (implantable) cardiac defibrillator: Secondary | ICD-10-CM | POA: Diagnosis not present

## 2019-05-07 DIAGNOSIS — I10 Essential (primary) hypertension: Secondary | ICD-10-CM | POA: Diagnosis not present

## 2019-05-07 DIAGNOSIS — K219 Gastro-esophageal reflux disease without esophagitis: Secondary | ICD-10-CM | POA: Diagnosis not present

## 2019-05-20 DIAGNOSIS — Z95810 Presence of automatic (implantable) cardiac defibrillator: Secondary | ICD-10-CM | POA: Diagnosis not present

## 2019-05-21 DIAGNOSIS — I255 Ischemic cardiomyopathy: Secondary | ICD-10-CM | POA: Diagnosis not present

## 2019-05-21 DIAGNOSIS — I2510 Atherosclerotic heart disease of native coronary artery without angina pectoris: Secondary | ICD-10-CM | POA: Diagnosis not present

## 2019-05-21 DIAGNOSIS — R5383 Other fatigue: Secondary | ICD-10-CM | POA: Diagnosis not present

## 2019-06-15 DIAGNOSIS — Z9229 Personal history of other drug therapy: Secondary | ICD-10-CM | POA: Diagnosis not present

## 2019-06-15 DIAGNOSIS — S39012A Strain of muscle, fascia and tendon of lower back, initial encounter: Secondary | ICD-10-CM | POA: Diagnosis not present

## 2019-06-15 DIAGNOSIS — M62830 Muscle spasm of back: Secondary | ICD-10-CM | POA: Diagnosis not present

## 2019-06-15 DIAGNOSIS — Z6825 Body mass index (BMI) 25.0-25.9, adult: Secondary | ICD-10-CM | POA: Diagnosis not present

## 2019-06-15 DIAGNOSIS — E663 Overweight: Secondary | ICD-10-CM | POA: Diagnosis not present

## 2019-07-07 DIAGNOSIS — I10 Essential (primary) hypertension: Secondary | ICD-10-CM | POA: Diagnosis not present

## 2019-07-07 DIAGNOSIS — E785 Hyperlipidemia, unspecified: Secondary | ICD-10-CM | POA: Diagnosis not present

## 2019-08-03 ENCOUNTER — Other Ambulatory Visit: Payer: Self-pay | Admitting: Oncology

## 2019-08-03 DIAGNOSIS — C61 Malignant neoplasm of prostate: Secondary | ICD-10-CM

## 2019-08-04 ENCOUNTER — Other Ambulatory Visit: Payer: Self-pay

## 2019-08-04 ENCOUNTER — Inpatient Hospital Stay: Payer: Medicare HMO

## 2019-08-04 ENCOUNTER — Inpatient Hospital Stay: Payer: Medicare HMO | Attending: Oncology | Admitting: Oncology

## 2019-08-04 VITALS — BP 129/86 | HR 87 | Temp 97.7°F | Resp 18 | Ht 66.0 in | Wt 160.9 lb

## 2019-08-04 DIAGNOSIS — C61 Malignant neoplasm of prostate: Secondary | ICD-10-CM

## 2019-08-04 DIAGNOSIS — G8929 Other chronic pain: Secondary | ICD-10-CM | POA: Diagnosis not present

## 2019-08-04 DIAGNOSIS — Z79899 Other long term (current) drug therapy: Secondary | ICD-10-CM | POA: Diagnosis not present

## 2019-08-04 LAB — CBC WITH DIFFERENTIAL (CANCER CENTER ONLY)
Abs Immature Granulocytes: 0.02 10*3/uL (ref 0.00–0.07)
Basophils Absolute: 0 10*3/uL (ref 0.0–0.1)
Basophils Relative: 1 %
Eosinophils Absolute: 0.9 10*3/uL — ABNORMAL HIGH (ref 0.0–0.5)
Eosinophils Relative: 16 %
HCT: 39.3 % (ref 39.0–52.0)
Hemoglobin: 12.6 g/dL — ABNORMAL LOW (ref 13.0–17.0)
Immature Granulocytes: 0 %
Lymphocytes Relative: 27 %
Lymphs Abs: 1.5 10*3/uL (ref 0.7–4.0)
MCH: 29.8 pg (ref 26.0–34.0)
MCHC: 32.1 g/dL (ref 30.0–36.0)
MCV: 92.9 fL (ref 80.0–100.0)
Monocytes Absolute: 0.7 10*3/uL (ref 0.1–1.0)
Monocytes Relative: 14 %
Neutro Abs: 2.4 10*3/uL (ref 1.7–7.7)
Neutrophils Relative %: 42 %
Platelet Count: 186 10*3/uL (ref 150–400)
RBC: 4.23 MIL/uL (ref 4.22–5.81)
RDW: 13.9 % (ref 11.5–15.5)
WBC Count: 5.5 10*3/uL (ref 4.0–10.5)
nRBC: 0 % (ref 0.0–0.2)

## 2019-08-04 LAB — CMP (CANCER CENTER ONLY)
ALT: 9 U/L (ref 0–44)
AST: 20 U/L (ref 15–41)
Albumin: 3.9 g/dL (ref 3.5–5.0)
Alkaline Phosphatase: 53 U/L (ref 38–126)
Anion gap: 6 (ref 5–15)
BUN: 22 mg/dL (ref 8–23)
CO2: 27 mmol/L (ref 22–32)
Calcium: 9.8 mg/dL (ref 8.9–10.3)
Chloride: 112 mmol/L — ABNORMAL HIGH (ref 98–111)
Creatinine: 1.39 mg/dL — ABNORMAL HIGH (ref 0.61–1.24)
GFR, Est AFR Am: 57 mL/min — ABNORMAL LOW (ref >60)
GFR, Estimated: 49 mL/min — ABNORMAL LOW (ref >60)
Glucose, Bld: 110 mg/dL — ABNORMAL HIGH (ref 70–99)
Potassium: 5.2 mmol/L — ABNORMAL HIGH (ref 3.5–5.1)
Sodium: 145 mmol/L (ref 135–145)
Total Bilirubin: 0.6 mg/dL (ref 0.3–1.2)
Total Protein: 6.4 g/dL — ABNORMAL LOW (ref 6.5–8.1)

## 2019-08-04 NOTE — Progress Notes (Signed)
Hematology and Oncology Follow Up Visit  Jesse Mann [id number] 04/22/1940 79 y.o. 08/04/2019 9:35 AM [address], [name], MDStreet, [name], *   Principle Diagnosis: 18-year man with prostate cancer diagnosed in 2016.  He was found to have Gleason score 4+4 = 8 and subsequently developed castration-sensitive with biochemical relapse.   Prior Therapy:  He is status post radical prostatectomy done on 03/03/2015 utilizing a nerve sparing approach. 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 Tumor involves bilateral seminal vesicles with margins involved with cancer. 2 lymph nodes were involved for a total of 8 sampled.    He developed biochemical relapse in July 2017 with a PSA of 1.28. Daily Lupron 30 mg injection given every 4 months first dose given on 08/08/2015 he completed 2 years of therapy in 2019.  Current therapy: Active surveillance with potential restart of androgen deprivation intermittently.  Interim History: Jesse Mann presents today for a repeat of follow-up.  Since the last visit, he reports no major changes in his health.  He denies any recent hospitalization or illnesses.  He does report chronic back pain related to his arthritis.  He ambulates without any difficulties for the most part and uses a cane occasionally.  He denies any weight loss or appetite changes.  He denies any excessive fatigue or tiredness.    Medications: Unchanged on review. Current Outpatient Medications  Medication Sig Dispense Refill  . acetaminophen (TYLENOL) 500 MG tablet Take 500 mg by mouth every 6 (six) hours as needed (for pain.).    Marland Kitchen amiodarone (PACERONE) 200 MG tablet Take 200 mg by mouth daily. 0.5 tab (100 mg total) po daily    . aspirin 81 MG tablet Take 81 mg by mouth daily.    . carvedilol (COREG) 12.5 MG tablet Take 6.25 mg by mouth 2 (two) times daily with a meal.     . famotidine (PEPCID) 40 MG tablet Take 40 mg at bedtime by mouth.     . losartan (COZAAR) 50 MG tablet Take 50 mg by mouth daily.    . nitroGLYCERIN (NITROSTAT) 0.4 MG SL tablet Place 0.4 mg under the tongue every 5 (five) minutes as needed for chest pain.     . rosuvastatin (CRESTOR) 10 MG tablet Take 1 tablet by mouth daily.     No current facility-administered medications for this visit.     Allergies: No Known Allergies     Physical Exam:   Blood pressure (!) 129/86, pulse 87, temperature 97.7 F (36.5 C), temperature source Temporal, resp. rate 18, height 5\' 6"  (1.676 m), weight 160 lb 14.4 oz (73 kg), SpO2 99 %.     ECOG: 0   General appearance: Comfortable appearing without any discomfort Head: Normocephalic without any trauma Oropharynx: Mucous membranes are moist and pink without any thrush or ulcers. Eyes: Pupils are equal and round reactive to light. Lymph nodes: No cervical, supraclavicular, inguinal or axillary lymphadenopathy.   Heart:regular rate and rhythm.  S1 and S2 without leg edema. Lung: Clear without any rhonchi or wheezes.  No dullness to percussion. Abdomin: Soft, nontender, nondistended with good bowel sounds.  No hepatosplenomegaly. Musculoskeletal: No joint deformity or effusion.  Full range of motion noted. Neurological: No deficits noted on motor, sensory and deep tendon reflex exam. Skin: No petechial rash or dryness.  Appeared moist.         Lab Results: Lab Results  Component Value Date   WBC 5.5 08/04/2019   HGB 12.6 (L)  08/04/2019   HCT 39.3 08/04/2019   MCV 92.9 08/04/2019   PLT 186 08/04/2019     Chemistry      Component Value Date/Time   NA 141 02/04/2019 0942   NA 140 11/27/2016 1107   K 4.2 02/04/2019 0942   K 4.4 11/27/2016 1107   CL 108 02/04/2019 0942   CO2 26 02/04/2019 0942   CO2 27 11/27/2016 1107   BUN 23 02/04/2019 0942   BUN 18.9 11/27/2016 1107   CREATININE 1.41 (H) 02/04/2019 0942   CREATININE 1.2 11/27/2016 1107      Component Value Date/Time   CALCIUM 8.8 (L)  02/04/2019 0942   CALCIUM 9.0 11/27/2016 1107   ALKPHOS 60 02/04/2019 0942   ALKPHOS 72 11/27/2016 1107   AST 17 02/04/2019 0942   AST 21 11/27/2016 1107   ALT 9 02/04/2019 0942   ALT 19 11/27/2016 1107   BILITOT 0.7 02/04/2019 0942   BILITOT 0.42 11/27/2016 1107         Results for Jesse Mann, Jesse Mann (MRN [id number]) as of 08/04/2019 09:30  Ref. Range 09/04/2018 08:17 02/04/2019 09:42  Prostate Specific Ag, Serum Latest Ref Range: 0.0 - 4.0 ng/mL <0.1 <0.1      Impression and Plan:   79 year old man with:  1.  Prostate cancer diagnosed in 2016.  He has castration-sensitive disease with biochemical relapse.  His PSA continues to be undetectable without any additional treatment at this time.  The role of intermittent androgen deprivation was discussed again.  Risks and benefits of this approach versus continuous androgen deprivation were discussed.  At this time he prefers this approach and will continue to monitor his PSA.  We will monitor his PSA closely and restart androgen deprivation his PSA starts to rise.  Additional therapy to androgen deprivation including androgen synthesis inhibitors and androgen receptor blockade were also discussed as a potential therapy escalation if his PSA starts to rise.  2. Bone health: He is on calcium and vitamin D replacement recommended for his bone health.  He is off androgen deprivation which helps his bone density.   3.  Follow-up: He will return in 6 months for repeat evaluation.  30  minutes were dedicated to this visit.  The time was spent on reviewing his disease status, treatment options and future plan of care review.   7/28/20219:35 AM

## 2019-08-05 ENCOUNTER — Telehealth: Payer: Self-pay

## 2019-08-05 LAB — PROSTATE-SPECIFIC AG, SERUM (LABCORP): Prostate Specific Ag, Serum: <0.1 ng/mL (ref 0.0–4.0)

## 2019-08-05 NOTE — Telephone Encounter (Signed)
-----   Message from [name], MD sent at 08/05/2019  9:55 AM EDT ----- Please let him know his PSA is low

## 2019-08-05 NOTE — Telephone Encounter (Signed)
TC to Pt. Per Dr Alen Blew Pt made aware of PSA results. Pt. Verbalized understanding. No further problems or concerns noted.

## 2019-08-07 DIAGNOSIS — E785 Hyperlipidemia, unspecified: Secondary | ICD-10-CM | POA: Diagnosis not present

## 2019-08-07 DIAGNOSIS — I10 Essential (primary) hypertension: Secondary | ICD-10-CM | POA: Diagnosis not present

## 2019-08-17 ENCOUNTER — Telehealth: Payer: Self-pay | Admitting: Oncology

## 2019-08-17 NOTE — Telephone Encounter (Signed)
Scheduled per 07/28 los, patient has been called and notified. 

## 2019-08-19 DIAGNOSIS — Z95810 Presence of automatic (implantable) cardiac defibrillator: Secondary | ICD-10-CM | POA: Diagnosis not present

## 2019-08-30 DIAGNOSIS — Z20828 Contact with and (suspected) exposure to other viral communicable diseases: Secondary | ICD-10-CM | POA: Diagnosis not present

## 2019-08-30 DIAGNOSIS — R05 Cough: Secondary | ICD-10-CM | POA: Diagnosis not present

## 2019-10-22 DIAGNOSIS — M47816 Spondylosis without myelopathy or radiculopathy, lumbar region: Secondary | ICD-10-CM | POA: Diagnosis not present

## 2019-10-22 DIAGNOSIS — M5136 Other intervertebral disc degeneration, lumbar region: Secondary | ICD-10-CM | POA: Diagnosis not present

## 2019-10-22 DIAGNOSIS — I469 Cardiac arrest, cause unspecified: Secondary | ICD-10-CM | POA: Diagnosis not present

## 2019-10-22 DIAGNOSIS — G8929 Other chronic pain: Secondary | ICD-10-CM | POA: Diagnosis not present

## 2019-10-22 DIAGNOSIS — I4901 Ventricular fibrillation: Secondary | ICD-10-CM | POA: Diagnosis not present

## 2019-10-22 DIAGNOSIS — M545 Low back pain, unspecified: Secondary | ICD-10-CM | POA: Diagnosis not present

## 2019-10-22 DIAGNOSIS — Z95810 Presence of automatic (implantable) cardiac defibrillator: Secondary | ICD-10-CM | POA: Diagnosis not present

## 2019-11-06 DIAGNOSIS — E7849 Other hyperlipidemia: Secondary | ICD-10-CM | POA: Diagnosis not present

## 2019-11-06 DIAGNOSIS — I10 Essential (primary) hypertension: Secondary | ICD-10-CM | POA: Diagnosis not present

## 2019-11-06 DIAGNOSIS — K219 Gastro-esophageal reflux disease without esophagitis: Secondary | ICD-10-CM | POA: Diagnosis not present

## 2019-11-22 DIAGNOSIS — M545 Low back pain, unspecified: Secondary | ICD-10-CM | POA: Diagnosis not present

## 2019-11-22 DIAGNOSIS — G8929 Other chronic pain: Secondary | ICD-10-CM | POA: Diagnosis not present

## 2019-11-22 DIAGNOSIS — M5136 Other intervertebral disc degeneration, lumbar region: Secondary | ICD-10-CM | POA: Diagnosis not present

## 2019-11-22 DIAGNOSIS — Z95810 Presence of automatic (implantable) cardiac defibrillator: Secondary | ICD-10-CM | POA: Diagnosis not present

## 2019-11-22 DIAGNOSIS — M47816 Spondylosis without myelopathy or radiculopathy, lumbar region: Secondary | ICD-10-CM | POA: Diagnosis not present

## 2020-01-06 DIAGNOSIS — G4730 Sleep apnea, unspecified: Secondary | ICD-10-CM | POA: Diagnosis not present

## 2020-01-06 DIAGNOSIS — E785 Hyperlipidemia, unspecified: Secondary | ICD-10-CM | POA: Diagnosis not present

## 2020-01-06 DIAGNOSIS — Z20822 Contact with and (suspected) exposure to covid-19: Secondary | ICD-10-CM | POA: Diagnosis not present

## 2020-01-06 DIAGNOSIS — K219 Gastro-esophageal reflux disease without esophagitis: Secondary | ICD-10-CM | POA: Diagnosis not present

## 2020-01-17 DIAGNOSIS — R7302 Impaired glucose tolerance (oral): Secondary | ICD-10-CM | POA: Diagnosis not present

## 2020-01-17 DIAGNOSIS — I110 Hypertensive heart disease with heart failure: Secondary | ICD-10-CM | POA: Diagnosis not present

## 2020-01-17 DIAGNOSIS — I420 Dilated cardiomyopathy: Secondary | ICD-10-CM | POA: Diagnosis not present

## 2020-01-17 DIAGNOSIS — I5022 Chronic systolic (congestive) heart failure: Secondary | ICD-10-CM | POA: Diagnosis not present

## 2020-01-17 DIAGNOSIS — Z0000 Encounter for general adult medical examination without abnormal findings: Secondary | ICD-10-CM | POA: Diagnosis not present

## 2020-01-17 DIAGNOSIS — Z79899 Other long term (current) drug therapy: Secondary | ICD-10-CM | POA: Diagnosis not present

## 2020-01-17 DIAGNOSIS — I255 Ischemic cardiomyopathy: Secondary | ICD-10-CM | POA: Diagnosis not present

## 2020-01-17 DIAGNOSIS — E785 Hyperlipidemia, unspecified: Secondary | ICD-10-CM | POA: Diagnosis not present

## 2020-01-17 DIAGNOSIS — I25119 Atherosclerotic heart disease of native coronary artery with unspecified angina pectoris: Secondary | ICD-10-CM | POA: Diagnosis not present

## 2020-02-02 ENCOUNTER — Other Ambulatory Visit: Payer: Self-pay

## 2020-02-02 ENCOUNTER — Inpatient Hospital Stay: Payer: Medicare HMO | Attending: Oncology

## 2020-02-02 ENCOUNTER — Inpatient Hospital Stay (HOSPITAL_BASED_OUTPATIENT_CLINIC_OR_DEPARTMENT_OTHER): Payer: Medicare HMO | Admitting: Oncology

## 2020-02-02 VITALS — BP 139/87 | HR 91 | Temp 97.7°F | Resp 18 | Ht 66.0 in | Wt 163.3 lb

## 2020-02-02 DIAGNOSIS — Z79899 Other long term (current) drug therapy: Secondary | ICD-10-CM | POA: Insufficient documentation

## 2020-02-02 DIAGNOSIS — Z8546 Personal history of malignant neoplasm of prostate: Secondary | ICD-10-CM | POA: Insufficient documentation

## 2020-02-02 DIAGNOSIS — G8929 Other chronic pain: Secondary | ICD-10-CM | POA: Diagnosis not present

## 2020-02-02 DIAGNOSIS — C61 Malignant neoplasm of prostate: Secondary | ICD-10-CM

## 2020-02-02 DIAGNOSIS — M549 Dorsalgia, unspecified: Secondary | ICD-10-CM | POA: Diagnosis not present

## 2020-02-02 LAB — CBC WITH DIFFERENTIAL (CANCER CENTER ONLY)
Abs Immature Granulocytes: 0.01 10*3/uL (ref 0.00–0.07)
Basophils Absolute: 0 10*3/uL (ref 0.0–0.1)
Basophils Relative: 1 %
Eosinophils Absolute: 0.8 10*3/uL — ABNORMAL HIGH (ref 0.0–0.5)
Eosinophils Relative: 15 %
HCT: 40.3 % (ref 39.0–52.0)
Hemoglobin: 12.9 g/dL — ABNORMAL LOW (ref 13.0–17.0)
Immature Granulocytes: 0 %
Lymphocytes Relative: 23 %
Lymphs Abs: 1.3 10*3/uL (ref 0.7–4.0)
MCH: 29.3 pg (ref 26.0–34.0)
MCHC: 32 g/dL (ref 30.0–36.0)
MCV: 91.6 fL (ref 80.0–100.0)
Monocytes Absolute: 0.7 10*3/uL (ref 0.1–1.0)
Monocytes Relative: 12 %
Neutro Abs: 2.8 10*3/uL (ref 1.7–7.7)
Neutrophils Relative %: 49 %
Platelet Count: 217 10*3/uL (ref 150–400)
RBC: 4.4 MIL/uL (ref 4.22–5.81)
RDW: 13.4 % (ref 11.5–15.5)
WBC Count: 5.7 10*3/uL (ref 4.0–10.5)
nRBC: 0 % (ref 0.0–0.2)

## 2020-02-02 LAB — CMP (CANCER CENTER ONLY)
ALT: 8 U/L (ref 0–44)
AST: 16 U/L (ref 15–41)
Albumin: 3.8 g/dL (ref 3.5–5.0)
Alkaline Phosphatase: 48 U/L (ref 38–126)
Anion gap: 7 (ref 5–15)
BUN: 19 mg/dL (ref 8–23)
CO2: 26 mmol/L (ref 22–32)
Calcium: 9.1 mg/dL (ref 8.9–10.3)
Chloride: 109 mmol/L (ref 98–111)
Creatinine: 1.49 mg/dL — ABNORMAL HIGH (ref 0.61–1.24)
GFR, Estimated: 48 mL/min — ABNORMAL LOW (ref >60)
Glucose, Bld: 106 mg/dL — ABNORMAL HIGH (ref 70–99)
Potassium: 5.2 mmol/L — ABNORMAL HIGH (ref 3.5–5.1)
Sodium: 142 mmol/L (ref 135–145)
Total Bilirubin: 0.6 mg/dL (ref 0.3–1.2)
Total Protein: 6.5 g/dL (ref 6.5–8.1)

## 2020-02-02 NOTE — Progress Notes (Signed)
Hematology and Oncology Follow Up Visit  NUCHEM GRATTAN [id number] 1940/04/12 80 y.o. 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  He was found to have Gleason score 4+4 = 8.   Prior Therapy:  He is status post radical prostatectomy done on 03/03/2015 utilizing a nerve sparing approach. 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 Tumor involves bilateral seminal vesicles with margins involved with cancer. 2 lymph nodes were involved for a total of 8 sampled.    He developed biochemical relapse in July 2017 with a PSA of 1.28.  He was treated Lupron 30 mg injection given every 4 months first dose given on 08/08/2015 he completed 2 years of therapy in 2019.  He opted against radiation at that time.  Current therapy: Active surveillance with intermittent androgen deprivation..  Interim History: Mr. Kuiken is here for repeat evaluation.  Since the last visit, he reports no major changes in his health.  He continues to have chronic back pain issues which is arthritic in nature.  He is scheduled to have an MRI in the near future.  He had denies any recent hospitalization or illnesses.  He denies any abdominal pain or discomfort.    Medications: Unchanged on review. Current Outpatient Medications  Medication Sig Dispense Refill  . acetaminophen (TYLENOL) 500 MG tablet Take 500 mg by mouth every 6 (six) hours as needed (for pain.).    Marland Kitchen amiodarone (PACERONE) 200 MG tablet Take 200 mg by mouth daily. 0.5 tab (100 mg total) po daily    . aspirin 81 MG tablet Take 81 mg by mouth daily.    . carvedilol (COREG) 12.5 MG tablet Take 6.25 mg by mouth 2 (two) times daily with a meal.     . famotidine (PEPCID) 40 MG tablet Take 40 mg at bedtime by mouth.    . losartan (COZAAR) 50 MG tablet Take 50 mg by mouth daily.    .  nitroGLYCERIN (NITROSTAT) 0.4 MG SL tablet Place 0.4 mg under the tongue every 5 (five) minutes as needed for chest pain.     . rosuvastatin (CRESTOR) 10 MG tablet Take 1 tablet by mouth daily.     No current facility-administered medications for this visit.     Allergies: No Known Allergies     Physical Exam:    Blood pressure 139/87, pulse 91, temperature 97.7 F (36.5 C), temperature source Tympanic, resp. rate 18, height 5\' 6"  (1.676 m), weight 163 lb 4.8 oz (74.1 kg), SpO2 99 %.     ECOG: 0    General appearance: Alert, awake without any distress. Head: Atraumatic without abnormalities Oropharynx: Without any thrush or ulcers. Eyes: No scleral icterus. Lymph nodes: No lymphadenopathy noted in the cervical, supraclavicular, or axillary nodes Heart:regular rate and rhythm, without any murmurs or gallops.   Lung: Clear to auscultation without any rhonchi, wheezes or dullness to percussion. Abdomin: Soft, nontender without any shifting dullness or ascites. Musculoskeletal: No clubbing or cyanosis. Neurological: No motor or sensory deficits. Skin: No rashes or lesions.         Lab Results: Lab Results  Component Value Date   WBC 5.5 08/04/2019   HGB 12.6 (L) 08/04/2019   HCT 39.3 08/04/2019   MCV 92.9 08/04/2019   PLT 186 08/04/2019     Chemistry      Component Value Date/Time   NA 145 08/04/2019  0911   NA 140 11/27/2016 1107   K 5.2 (H) 08/04/2019 0911   K 4.4 11/27/2016 1107   CL 112 (H) 08/04/2019 0911   CO2 27 08/04/2019 0911   CO2 27 11/27/2016 1107   BUN 22 08/04/2019 0911   BUN 18.9 11/27/2016 1107   CREATININE 1.39 (H) 08/04/2019 0911   CREATININE 1.2 11/27/2016 1107      Component Value Date/Time   CALCIUM 9.8 08/04/2019 0911   CALCIUM 9.0 11/27/2016 1107   ALKPHOS 53 08/04/2019 0911   ALKPHOS 72 11/27/2016 1107   AST 20 08/04/2019 0911   AST 21 11/27/2016 1107   ALT 9 08/04/2019 0911   ALT 19 11/27/2016 1107   BILITOT 0.6  08/04/2019 0911   BILITOT 0.42 11/27/2016 1107        Results for PHENIX, VANDERMEULEN (MRN [id number]) as of 02/02/2020 08:34  Ref. Range 02/04/2019 09:42 08/04/2019 09:11  Prostate Specific Ag, Serum Latest Ref Range: 0.0 - 4.0 ng/mL <0.1 <0.1       Impression and Plan:   80 year old man with:  1.  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    He remains off treatment at this time with PSA undetectable and no evidence of relapsed disease.  The natural course of this disease and treatment options were discussed at this time.  If his PSA starts to rise again we will restage him with PSMA PET scan and potentially treat any oligometastatic disease with radiation and potentially start androgen deprivation if he has widespread metastasis.  He is agreeable with this plan.  He has opted against radiation previously and likely will opt against it again.  He will be open to the androgen deprivation option.  2. Bone health: No evidence of metastatic bone disease at this time.  I recommended calcium and vitamin D supplements.  3.  Back pain: related to arthritis without any evidence of malignancy.  Repeat MRI scan of the neck near future.  4.  Follow-up: In 6 months for repeat evaluation.  30  minutes were spent on this encounter.  The time was dedicated to reviewing his disease status, laboratory results and outlining future plan of care.  1/26/20228:33 AM

## 2020-02-03 ENCOUNTER — Telehealth: Payer: Self-pay

## 2020-02-03 LAB — PROSTATE-SPECIFIC AG, SERUM (LABCORP): Prostate Specific Ag, Serum: <0.1 ng/mL (ref 0.0–4.0)

## 2020-02-03 NOTE — Telephone Encounter (Signed)
Called patient and made him aware of PSA result. He verbalized understanding.  °

## 2020-02-03 NOTE — Telephone Encounter (Signed)
-----   Message from [name], MD sent at 02/03/2020  8:11 AM EST ----- Please let him know his PSA is still low

## 2020-02-04 DIAGNOSIS — M5136 Other intervertebral disc degeneration, lumbar region: Secondary | ICD-10-CM | POA: Diagnosis not present

## 2020-02-04 DIAGNOSIS — M545 Low back pain, unspecified: Secondary | ICD-10-CM | POA: Diagnosis not present

## 2020-02-04 DIAGNOSIS — M48061 Spinal stenosis, lumbar region without neurogenic claudication: Secondary | ICD-10-CM | POA: Diagnosis not present

## 2020-02-04 DIAGNOSIS — G8929 Other chronic pain: Secondary | ICD-10-CM | POA: Diagnosis not present

## 2020-02-04 DIAGNOSIS — M47816 Spondylosis without myelopathy or radiculopathy, lumbar region: Secondary | ICD-10-CM | POA: Diagnosis not present

## 2020-02-04 DIAGNOSIS — Z95810 Presence of automatic (implantable) cardiac defibrillator: Secondary | ICD-10-CM | POA: Diagnosis not present

## 2020-02-04 DIAGNOSIS — M9963 Osseous and subluxation stenosis of intervertebral foramina of lumbar region: Secondary | ICD-10-CM | POA: Diagnosis not present

## 2020-02-10 ENCOUNTER — Telehealth: Payer: Self-pay | Admitting: Oncology

## 2020-02-10 NOTE — Telephone Encounter (Signed)
Rescheduled patient due to provider's prostate clinic. Patient is aware of changes.

## 2020-02-21 DIAGNOSIS — M47816 Spondylosis without myelopathy or radiculopathy, lumbar region: Secondary | ICD-10-CM | POA: Diagnosis not present

## 2020-03-02 DIAGNOSIS — I469 Cardiac arrest, cause unspecified: Secondary | ICD-10-CM | POA: Diagnosis not present

## 2020-03-02 DIAGNOSIS — I4901 Ventricular fibrillation: Secondary | ICD-10-CM | POA: Diagnosis not present

## 2020-03-23 DIAGNOSIS — G8929 Other chronic pain: Secondary | ICD-10-CM | POA: Diagnosis not present

## 2020-03-23 DIAGNOSIS — M545 Low back pain, unspecified: Secondary | ICD-10-CM | POA: Diagnosis not present

## 2020-03-23 DIAGNOSIS — M5136 Other intervertebral disc degeneration, lumbar region: Secondary | ICD-10-CM | POA: Diagnosis not present

## 2020-03-23 DIAGNOSIS — M47816 Spondylosis without myelopathy or radiculopathy, lumbar region: Secondary | ICD-10-CM | POA: Diagnosis not present

## 2020-04-05 DIAGNOSIS — I10 Essential (primary) hypertension: Secondary | ICD-10-CM | POA: Diagnosis not present

## 2020-04-05 DIAGNOSIS — E785 Hyperlipidemia, unspecified: Secondary | ICD-10-CM | POA: Diagnosis not present

## 2020-05-06 DIAGNOSIS — E785 Hyperlipidemia, unspecified: Secondary | ICD-10-CM | POA: Diagnosis not present

## 2020-05-06 DIAGNOSIS — I10 Essential (primary) hypertension: Secondary | ICD-10-CM | POA: Diagnosis not present

## 2020-05-15 DIAGNOSIS — Z95810 Presence of automatic (implantable) cardiac defibrillator: Secondary | ICD-10-CM | POA: Diagnosis not present

## 2020-05-15 DIAGNOSIS — I2510 Atherosclerotic heart disease of native coronary artery without angina pectoris: Secondary | ICD-10-CM | POA: Diagnosis not present

## 2020-05-15 DIAGNOSIS — R5383 Other fatigue: Secondary | ICD-10-CM | POA: Diagnosis not present

## 2020-05-15 DIAGNOSIS — I469 Cardiac arrest, cause unspecified: Secondary | ICD-10-CM | POA: Diagnosis not present

## 2020-05-15 DIAGNOSIS — I255 Ischemic cardiomyopathy: Secondary | ICD-10-CM | POA: Diagnosis not present

## 2020-05-15 DIAGNOSIS — I4901 Ventricular fibrillation: Secondary | ICD-10-CM | POA: Diagnosis not present

## 2020-05-15 DIAGNOSIS — R0600 Dyspnea, unspecified: Secondary | ICD-10-CM | POA: Diagnosis not present

## 2020-05-22 DIAGNOSIS — R5383 Other fatigue: Secondary | ICD-10-CM | POA: Diagnosis not present

## 2020-05-22 DIAGNOSIS — I2510 Atherosclerotic heart disease of native coronary artery without angina pectoris: Secondary | ICD-10-CM | POA: Diagnosis not present

## 2020-05-22 DIAGNOSIS — I255 Ischemic cardiomyopathy: Secondary | ICD-10-CM | POA: Diagnosis not present

## 2020-05-22 DIAGNOSIS — R0600 Dyspnea, unspecified: Secondary | ICD-10-CM | POA: Diagnosis not present

## 2020-05-24 DIAGNOSIS — I469 Cardiac arrest, cause unspecified: Secondary | ICD-10-CM | POA: Diagnosis not present

## 2020-05-24 DIAGNOSIS — I361 Nonrheumatic tricuspid (valve) insufficiency: Secondary | ICD-10-CM | POA: Diagnosis not present

## 2020-05-24 DIAGNOSIS — R5383 Other fatigue: Secondary | ICD-10-CM | POA: Diagnosis not present

## 2020-05-24 DIAGNOSIS — I4901 Ventricular fibrillation: Secondary | ICD-10-CM | POA: Diagnosis not present

## 2020-05-24 DIAGNOSIS — I2510 Atherosclerotic heart disease of native coronary artery without angina pectoris: Secondary | ICD-10-CM | POA: Diagnosis not present

## 2020-06-01 DIAGNOSIS — Z4502 Encounter for adjustment and management of automatic implantable cardiac defibrillator: Secondary | ICD-10-CM | POA: Diagnosis not present

## 2020-06-08 DIAGNOSIS — R0600 Dyspnea, unspecified: Secondary | ICD-10-CM | POA: Diagnosis not present

## 2020-06-08 DIAGNOSIS — E7800 Pure hypercholesterolemia, unspecified: Secondary | ICD-10-CM | POA: Diagnosis not present

## 2020-06-08 DIAGNOSIS — I2510 Atherosclerotic heart disease of native coronary artery without angina pectoris: Secondary | ICD-10-CM | POA: Diagnosis not present

## 2020-06-08 DIAGNOSIS — I255 Ischemic cardiomyopathy: Secondary | ICD-10-CM | POA: Diagnosis not present

## 2020-06-08 DIAGNOSIS — I509 Heart failure, unspecified: Secondary | ICD-10-CM | POA: Diagnosis not present

## 2020-06-14 DIAGNOSIS — N1831 Chronic kidney disease, stage 3a: Secondary | ICD-10-CM | POA: Diagnosis not present

## 2020-07-06 DIAGNOSIS — E785 Hyperlipidemia, unspecified: Secondary | ICD-10-CM | POA: Diagnosis not present

## 2020-07-06 DIAGNOSIS — I10 Essential (primary) hypertension: Secondary | ICD-10-CM | POA: Diagnosis not present

## 2020-08-01 ENCOUNTER — Other Ambulatory Visit: Payer: Medicare HMO

## 2020-08-01 ENCOUNTER — Ambulatory Visit: Payer: Medicare HMO | Admitting: Oncology

## 2020-08-01 ENCOUNTER — Other Ambulatory Visit: Payer: Self-pay

## 2020-08-01 ENCOUNTER — Inpatient Hospital Stay: Payer: Medicare HMO | Attending: Oncology

## 2020-08-01 ENCOUNTER — Inpatient Hospital Stay: Payer: Medicare HMO | Admitting: Oncology

## 2020-08-01 VITALS — BP 152/99 | HR 80 | Temp 98.5°F | Resp 17 | Ht 66.0 in | Wt 159.4 lb

## 2020-08-01 DIAGNOSIS — C61 Malignant neoplasm of prostate: Secondary | ICD-10-CM

## 2020-08-01 DIAGNOSIS — Z79899 Other long term (current) drug therapy: Secondary | ICD-10-CM | POA: Diagnosis not present

## 2020-08-01 LAB — CBC WITH DIFFERENTIAL (CANCER CENTER ONLY)
Abs Immature Granulocytes: 0.01 10*3/uL (ref 0.00–0.07)
Basophils Absolute: 0 10*3/uL (ref 0.0–0.1)
Basophils Relative: 1 %
Eosinophils Absolute: 0.9 10*3/uL — ABNORMAL HIGH (ref 0.0–0.5)
Eosinophils Relative: 16 %
HCT: 40.8 % (ref 39.0–52.0)
Hemoglobin: 13.3 g/dL (ref 13.0–17.0)
Immature Granulocytes: 0 %
Lymphocytes Relative: 22 %
Lymphs Abs: 1.3 10*3/uL (ref 0.7–4.0)
MCH: 29.6 pg (ref 26.0–34.0)
MCHC: 32.6 g/dL (ref 30.0–36.0)
MCV: 90.9 fL (ref 80.0–100.0)
Monocytes Absolute: 0.6 10*3/uL (ref 0.1–1.0)
Monocytes Relative: 10 %
Neutro Abs: 2.9 10*3/uL (ref 1.7–7.7)
Neutrophils Relative %: 51 %
Platelet Count: 216 10*3/uL (ref 150–400)
RBC: 4.49 MIL/uL (ref 4.22–5.81)
RDW: 13.4 % (ref 11.5–15.5)
WBC Count: 5.7 10*3/uL (ref 4.0–10.5)
nRBC: 0 % (ref 0.0–0.2)

## 2020-08-01 LAB — CMP (CANCER CENTER ONLY)
ALT: 8 U/L (ref 0–44)
AST: 16 U/L (ref 15–41)
Albumin: 3.8 g/dL (ref 3.5–5.0)
Alkaline Phosphatase: 50 U/L (ref 38–126)
Anion gap: 8 (ref 5–15)
BUN: 18 mg/dL (ref 8–23)
CO2: 26 mmol/L (ref 22–32)
Calcium: 9.3 mg/dL (ref 8.9–10.3)
Chloride: 108 mmol/L (ref 98–111)
Creatinine: 1.32 mg/dL — ABNORMAL HIGH (ref 0.61–1.24)
GFR, Estimated: 56 mL/min — ABNORMAL LOW (ref >60)
Glucose, Bld: 97 mg/dL (ref 70–99)
Potassium: 4.7 mmol/L (ref 3.5–5.1)
Sodium: 142 mmol/L (ref 135–145)
Total Bilirubin: 0.5 mg/dL (ref 0.3–1.2)
Total Protein: 6.7 g/dL (ref 6.5–8.1)

## 2020-08-01 NOTE — Progress Notes (Signed)
Hematology and Oncology Follow Up Visit  Jesse Mann [id number] 01-26-1940 80 y.o. 08/01/2020 8:16 AM [address], [name], MDStreet, [name], *   Principle Diagnosis: 61-year man with prostate cancer diagnosed in 2016 after presenting with Gleason score of 4+5 = 9 and pathological staging of T3BN1.  He developed biochemical relapse in 2017.   Prior Therapy:  He is status post radical prostatectomy done on 03/03/2015 utilizing a nerve sparing approach. 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 Tumor involves bilateral seminal vesicles with margins involved with cancer. 2 lymph nodes were involved for a total of 8 sampled.    He developed biochemical relapse in July 2017 with a PSA of 1.28.  He was treated Lupron 30 mg injection given every 4 months first dose given on 08/08/2015 he completed 2 years of therapy in 2019.  He opted against radiation at that time.  Current therapy: Intermittent androgen deprivation.  Interim History: Jesse Mann returns today for a follow-up visit.  Since her last visit, he reports feeling well without any new complaints.  He continues to have back pain issues related to spinal stenosis.  MRI in January 2022 did not reveal any evidence of metastatic disease or malignancy.  He did receive epidural injection which helped his pain temporarily.    Medications: Reviewed without changes. Current Outpatient Medications  Medication Sig Dispense Refill   acetaminophen (TYLENOL) 500 MG tablet Take 500 mg by mouth every 6 (six) hours as needed (for pain.).     amiodarone (PACERONE) 200 MG tablet Take 200 mg by mouth daily. 0.5 tab (100 mg total) po daily     aspirin 81 MG tablet Take 81 mg by mouth daily.     carvedilol (COREG) 12.5 MG tablet Take 6.25 mg by mouth 2 (two) times daily with a meal.      famotidine (PEPCID) 40 MG tablet Take 40 mg at bedtime by mouth.     losartan (COZAAR) 50 MG tablet Take 50 mg by mouth  daily.     nitroGLYCERIN (NITROSTAT) 0.4 MG SL tablet Place 0.4 mg under the tongue every 5 (five) minutes as needed for chest pain.      rosuvastatin (CRESTOR) 10 MG tablet Take 1 tablet by mouth daily.     No current facility-administered medications for this visit.     Allergies: No Known Allergies     Physical Exam:     Blood pressure (!) 152/99, pulse 80, temperature 98.5 F (36.9 C), temperature source Oral, resp. rate 17, height '5\' 6"'$  (1.676 m), weight 159 lb 6.4 oz (72.3 kg), SpO2 98 %.     ECOG: 0   General appearance: Comfortable appearing without any discomfort Head: Normocephalic without any trauma Oropharynx: Mucous membranes are moist and pink without any thrush or ulcers. Eyes: Pupils are equal and round reactive to light. Lymph nodes: No cervical, supraclavicular, inguinal or axillary lymphadenopathy.   Heart:regular rate and rhythm.  S1 and S2 without leg edema. Lung: Clear without any rhonchi or wheezes.  No dullness to percussion. Abdomin: Soft, nontender, nondistended with good bowel sounds.  No hepatosplenomegaly. Musculoskeletal: No joint deformity or effusion.  Full range of motion noted. Neurological: No deficits noted on motor, sensory and deep tendon reflex exam. Skin: No petechial rash or dryness.  Appeared moist.           Lab Results: Lab Results  Component Value Date   WBC 5.7 02/02/2020   HGB 12.9 (L) 02/02/2020  HCT 40.3 02/02/2020   MCV 91.6 02/02/2020   PLT 217 02/02/2020     Chemistry      Component Value Date/Time   NA 142 02/02/2020 0829   NA 140 11/27/2016 1107   K 5.2 (H) 02/02/2020 0829   K 4.4 11/27/2016 1107   CL 109 02/02/2020 0829   CO2 26 02/02/2020 0829   CO2 27 11/27/2016 1107   BUN 19 02/02/2020 0829   BUN 18.9 11/27/2016 1107   CREATININE 1.49 (H) 02/02/2020 0829   CREATININE 1.2 11/27/2016 1107      Component Value Date/Time   CALCIUM 9.1 02/02/2020 0829   CALCIUM 9.0 11/27/2016 1107   ALKPHOS  48 02/02/2020 0829   ALKPHOS 72 11/27/2016 1107   AST 16 02/02/2020 0829   AST 21 11/27/2016 1107   ALT 8 02/02/2020 0829   ALT 19 11/27/2016 1107   BILITOT 0.6 02/02/2020 0829   BILITOT 0.42 11/27/2016 1107          Results for Jesse Mann, Jesse Mann (MRN [id number]) as of 08/01/2020 08:16  Ref. Range 08/04/2019 09:11 02/02/2020 08:29  Prostate Specific Ag, Serum Latest Ref Range: 0.0 - 4.0 ng/mL <0.1 <0.1      Impression and Plan:   80 year old man with:  1.  Prostate cancer diagnosed in 2016.  He developed castration-sensitive biochemical relapse.  His disease status was updated at this time and treatment choices were discussed.  His PSA remains undetectable without any intervention at this time.  He has not repeated any additional androgen depravation therapy.  I recommended continued monitoring at this time and repeat staging scans if his PSA starts to rise.  PSMA PET scan could be useful option to detect his disease and consideration for systemic therapy versus local salvage therapy could be considered.  He is agreeable to continue with this plan.  2. Bone health: I recommended calcium and vitamin D supplements for bone protection long-term.  3.  Back pain: Related to spinal stenosis without any evidence of malignancy.  Continues to follow with orthopedic surgery regarding this issue.  4.  Follow-up: He will return in 6 months for a follow-up visit.  30  minutes were dedicated to this visit.  Time was spent on reviewing laboratory data, disease status update, treatment choices and future plan of care discussion.  7/26/20228:16 AM

## 2020-08-02 LAB — PROSTATE-SPECIFIC AG, SERUM (LABCORP): Prostate Specific Ag, Serum: <0.1 ng/mL (ref 0.0–4.0)

## 2020-08-06 DIAGNOSIS — E785 Hyperlipidemia, unspecified: Secondary | ICD-10-CM | POA: Diagnosis not present

## 2020-08-06 DIAGNOSIS — I10 Essential (primary) hypertension: Secondary | ICD-10-CM | POA: Diagnosis not present

## 2020-08-15 DIAGNOSIS — H5203 Hypermetropia, bilateral: Secondary | ICD-10-CM | POA: Diagnosis not present

## 2020-08-31 DIAGNOSIS — Z95810 Presence of automatic (implantable) cardiac defibrillator: Secondary | ICD-10-CM | POA: Diagnosis not present

## 2020-09-06 DIAGNOSIS — E785 Hyperlipidemia, unspecified: Secondary | ICD-10-CM | POA: Diagnosis not present

## 2020-09-06 DIAGNOSIS — I10 Essential (primary) hypertension: Secondary | ICD-10-CM | POA: Diagnosis not present

## 2020-10-06 DIAGNOSIS — I5022 Chronic systolic (congestive) heart failure: Secondary | ICD-10-CM | POA: Diagnosis not present

## 2020-10-06 DIAGNOSIS — E785 Hyperlipidemia, unspecified: Secondary | ICD-10-CM | POA: Diagnosis not present

## 2020-10-06 DIAGNOSIS — K2960 Other gastritis without bleeding: Secondary | ICD-10-CM | POA: Diagnosis not present

## 2020-11-14 DIAGNOSIS — I255 Ischemic cardiomyopathy: Secondary | ICD-10-CM | POA: Diagnosis not present

## 2020-11-14 DIAGNOSIS — Z95810 Presence of automatic (implantable) cardiac defibrillator: Secondary | ICD-10-CM | POA: Diagnosis not present

## 2020-11-14 DIAGNOSIS — I4901 Ventricular fibrillation: Secondary | ICD-10-CM | POA: Diagnosis not present

## 2020-11-14 DIAGNOSIS — I469 Cardiac arrest, cause unspecified: Secondary | ICD-10-CM | POA: Diagnosis not present

## 2020-11-14 DIAGNOSIS — I2510 Atherosclerotic heart disease of native coronary artery without angina pectoris: Secondary | ICD-10-CM | POA: Diagnosis not present

## 2020-11-28 DIAGNOSIS — I2510 Atherosclerotic heart disease of native coronary artery without angina pectoris: Secondary | ICD-10-CM | POA: Diagnosis not present

## 2020-11-28 DIAGNOSIS — I429 Cardiomyopathy, unspecified: Secondary | ICD-10-CM | POA: Diagnosis not present

## 2020-11-28 DIAGNOSIS — I469 Cardiac arrest, cause unspecified: Secondary | ICD-10-CM | POA: Diagnosis not present

## 2020-11-28 DIAGNOSIS — I4901 Ventricular fibrillation: Secondary | ICD-10-CM | POA: Diagnosis not present

## 2020-12-06 DIAGNOSIS — I5022 Chronic systolic (congestive) heart failure: Secondary | ICD-10-CM | POA: Diagnosis not present

## 2020-12-06 DIAGNOSIS — E785 Hyperlipidemia, unspecified: Secondary | ICD-10-CM | POA: Diagnosis not present

## 2020-12-06 DIAGNOSIS — K2960 Other gastritis without bleeding: Secondary | ICD-10-CM | POA: Diagnosis not present

## 2021-01-09 DIAGNOSIS — H25811 Combined forms of age-related cataract, right eye: Secondary | ICD-10-CM | POA: Diagnosis not present

## 2021-01-09 DIAGNOSIS — H25813 Combined forms of age-related cataract, bilateral: Secondary | ICD-10-CM | POA: Diagnosis not present

## 2021-01-09 DIAGNOSIS — Z01818 Encounter for other preprocedural examination: Secondary | ICD-10-CM | POA: Diagnosis not present

## 2021-01-09 DIAGNOSIS — E876 Hypokalemia: Secondary | ICD-10-CM | POA: Diagnosis not present

## 2021-01-16 DIAGNOSIS — I509 Heart failure, unspecified: Secondary | ICD-10-CM | POA: Diagnosis not present

## 2021-01-16 DIAGNOSIS — I2510 Atherosclerotic heart disease of native coronary artery without angina pectoris: Secondary | ICD-10-CM | POA: Diagnosis not present

## 2021-01-16 DIAGNOSIS — E7800 Pure hypercholesterolemia, unspecified: Secondary | ICD-10-CM | POA: Diagnosis not present

## 2021-01-16 DIAGNOSIS — I255 Ischemic cardiomyopathy: Secondary | ICD-10-CM | POA: Diagnosis not present

## 2021-01-16 DIAGNOSIS — Z95810 Presence of automatic (implantable) cardiac defibrillator: Secondary | ICD-10-CM | POA: Diagnosis not present

## 2021-01-16 DIAGNOSIS — Z4502 Encounter for adjustment and management of automatic implantable cardiac defibrillator: Secondary | ICD-10-CM | POA: Diagnosis not present

## 2021-01-30 ENCOUNTER — Inpatient Hospital Stay: Payer: Medicare HMO | Attending: Oncology

## 2021-01-30 ENCOUNTER — Inpatient Hospital Stay: Payer: Medicare HMO | Admitting: Oncology

## 2021-01-30 ENCOUNTER — Other Ambulatory Visit: Payer: Self-pay

## 2021-01-30 VITALS — BP 139/90 | HR 85 | Temp 98.0°F | Resp 18 | Ht 66.0 in | Wt 165.4 lb

## 2021-01-30 DIAGNOSIS — C61 Malignant neoplasm of prostate: Secondary | ICD-10-CM | POA: Insufficient documentation

## 2021-01-30 DIAGNOSIS — Z79899 Other long term (current) drug therapy: Secondary | ICD-10-CM | POA: Insufficient documentation

## 2021-01-30 DIAGNOSIS — M1990 Unspecified osteoarthritis, unspecified site: Secondary | ICD-10-CM | POA: Insufficient documentation

## 2021-01-30 LAB — CBC WITH DIFFERENTIAL (CANCER CENTER ONLY)
Abs Immature Granulocytes: 0.01 10*3/uL (ref 0.00–0.07)
Basophils Absolute: 0 10*3/uL (ref 0.0–0.1)
Basophils Relative: 1 %
Eosinophils Absolute: 0.9 10*3/uL — ABNORMAL HIGH (ref 0.0–0.5)
Eosinophils Relative: 18 %
HCT: 38.8 % — ABNORMAL LOW (ref 39.0–52.0)
Hemoglobin: 12.5 g/dL — ABNORMAL LOW (ref 13.0–17.0)
Immature Granulocytes: 0 %
Lymphocytes Relative: 23 %
Lymphs Abs: 1.2 10*3/uL (ref 0.7–4.0)
MCH: 28.8 pg (ref 26.0–34.0)
MCHC: 32.2 g/dL (ref 30.0–36.0)
MCV: 89.4 fL (ref 80.0–100.0)
Monocytes Absolute: 0.6 10*3/uL (ref 0.1–1.0)
Monocytes Relative: 12 %
Neutro Abs: 2.3 10*3/uL (ref 1.7–7.7)
Neutrophils Relative %: 46 %
Platelet Count: 214 10*3/uL (ref 150–400)
RBC: 4.34 MIL/uL (ref 4.22–5.81)
RDW: 14.1 % (ref 11.5–15.5)
WBC Count: 4.9 10*3/uL (ref 4.0–10.5)
nRBC: 0 % (ref 0.0–0.2)

## 2021-01-30 LAB — CMP (CANCER CENTER ONLY)
ALT: 7 U/L (ref 0–44)
AST: 16 U/L (ref 15–41)
Albumin: 4.1 g/dL (ref 3.5–5.0)
Alkaline Phosphatase: 45 U/L (ref 38–126)
Anion gap: 4 — ABNORMAL LOW (ref 5–15)
BUN: 18 mg/dL (ref 8–23)
CO2: 28 mmol/L (ref 22–32)
Calcium: 9.1 mg/dL (ref 8.9–10.3)
Chloride: 107 mmol/L (ref 98–111)
Creatinine: 1.31 mg/dL — ABNORMAL HIGH (ref 0.61–1.24)
GFR, Estimated: 56 mL/min — ABNORMAL LOW (ref >60)
Glucose, Bld: 96 mg/dL (ref 70–99)
Potassium: 4.5 mmol/L (ref 3.5–5.1)
Sodium: 139 mmol/L (ref 135–145)
Total Bilirubin: 0.6 mg/dL (ref 0.3–1.2)
Total Protein: 6.5 g/dL (ref 6.5–8.1)

## 2021-01-30 NOTE — Progress Notes (Signed)
Hematology and Oncology Follow Up Visit  Jesse Mann [id number] 11/05/1940 81 y.o. 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  He was diagnosed in 2016 with Gleason score of 4+5 = 9 and pathological staging of T3bN1.     Prior Therapy:  He is status post radical prostatectomy done on 03/03/2015 utilizing a nerve sparing approach. 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 Tumor involves bilateral seminal vesicles with margins involved with cancer. 2 lymph nodes were involved for a total of 8 sampled.    He developed biochemical relapse in July 2017 with a PSA of 1.28.  He was treated Lupron 30 mg injection given every 4 months first dose given on 08/08/2015 he completed 2 years of therapy in 2019.  He opted against radiation at that time.  Current therapy: Intermittent androgen deprivation.  Interim History: Jesse Mann presents today for return evaluation.  Since the last visit, he reports feeling well without any major complaints.  He denies any hospitalizations or illnesses.  He denies any worsening bone pain or pathological fractures.  He denies any constitutional symptoms.  He does have chronic back pain and knee pain which is arthritic in nature and has not changed.  His performance status quality of life remains unchanged.    Medications: Updated on review. Current Outpatient Medications  Medication Sig Dispense Refill   acetaminophen (TYLENOL) 500 MG tablet Take 500 mg by mouth every 6 (six) hours as needed (for pain.).     amiodarone (PACERONE) 200 MG tablet Take 200 mg by mouth daily. 0.5 tab (100 mg total) po daily     aspirin 81 MG tablet Take 81 mg by mouth daily.     carvedilol (COREG) 12.5 MG tablet Take 6.25 mg by mouth 2 (two) times daily with a meal.      famotidine (PEPCID) 40 MG tablet Take  40 mg at bedtime by mouth.     losartan (COZAAR) 50 MG tablet Take 50 mg by mouth daily.     nitroGLYCERIN (NITROSTAT) 0.4 MG SL tablet Place 0.4 mg under the tongue every 5 (five) minutes as needed for chest pain.      rosuvastatin (CRESTOR) 10 MG tablet Take 1 tablet by mouth daily.     No current facility-administered medications for this visit.     Allergies: No Known Allergies     Physical Exam:     Blood pressure 139/90, pulse 85, temperature 98 F (36.7 C), temperature source Temporal, resp. rate 18, height 5\' 6"  (1.676 m), weight 165 lb 6.4 oz (75 kg), SpO2 100 %.      ECOG: 1    General appearance: Alert, awake without any distress. Head: Atraumatic without abnormalities Oropharynx: Without any thrush or ulcers. Eyes: No scleral icterus. Lymph nodes: No lymphadenopathy noted in the cervical, supraclavicular, or axillary nodes Heart:regular rate and rhythm, without any murmurs or gallops.   Lung: Clear to auscultation without any rhonchi, wheezes or dullness to percussion. Abdomin: Soft, nontender without any shifting dullness or ascites. Musculoskeletal: No clubbing or cyanosis. Neurological: No motor or sensory deficits. Skin: No rashes or lesions.           Lab Results: Lab Results  Component Value Date   WBC 5.7 08/01/2020   HGB 13.3 08/01/2020   HCT 40.8 08/01/2020   MCV 90.9 08/01/2020   PLT 216 08/01/2020  Chemistry      Component Value Date/Time   NA 142 08/01/2020 0815   NA 140 11/27/2016 1107   K 4.7 08/01/2020 0815   K 4.4 11/27/2016 1107   CL 108 08/01/2020 0815   CO2 26 08/01/2020 0815   CO2 27 11/27/2016 1107   BUN 18 08/01/2020 0815   BUN 18.9 11/27/2016 1107   CREATININE 1.32 (H) 08/01/2020 0815   CREATININE 1.2 11/27/2016 1107      Component Value Date/Time   CALCIUM 9.3 08/01/2020 0815   CALCIUM 9.0 11/27/2016 1107   ALKPHOS 50 08/01/2020 0815   ALKPHOS 72 11/27/2016 1107   AST 16 08/01/2020 0815   AST 21  11/27/2016 1107   ALT 8 08/01/2020 0815   ALT 19 11/27/2016 1107   BILITOT 0.5 08/01/2020 0815   BILITOT 0.42 11/27/2016 1107           Latest Reference Range & Units 02/02/20 08:29 08/01/20 08:15  Prostate Specific Ag, Serum 0.0 - 4.0 ng/mL <0.1 <0.1       Impression and Plan:   81 year old man with:  1.  Castration-sensitive prostate cancer with biochemical relapse.   His PSA continues to be undetectable indicating excellent response to previous therapy.  Risks and benefits of additional treatment including androgen deprivation therapy were reviewed.  This will be deferred if his PSA starts to rise.  At that time, we will obtain imaging studies including PSMA PET scan to determine best course of action.  He is agreeable to continue with this plan.  2. Bone health: He is to continue calcium and vitamin D supplements.  He is at risk of osteoporosis given his previous exposure to androgen deprivation.  3.  Back pain: Unrelated to metastatic prostate cancer.  Related to osteoarthritis and unchanged at this time.  4.  Follow-up: In 6 months for repeat follow-up.  30  minutes were spent on this encounter.  The time was dedicated to reviewing laboratory data, disease status update and future plan of care discussion.  1/24/20239:08 AM

## 2021-01-31 ENCOUNTER — Telehealth: Payer: Self-pay | Admitting: *Deleted

## 2021-01-31 LAB — PROSTATE-SPECIFIC AG, SERUM (LABCORP): Prostate Specific Ag, Serum: <0.1 ng/mL (ref 0.0–4.0)

## 2021-01-31 NOTE — Telephone Encounter (Signed)
-----   Message from [name], MD sent at 01/31/2021  8:11 AM EST ----- Please let him know PSA is still low

## 2021-01-31 NOTE — Telephone Encounter (Signed)
Per Dr.Shadad, called pt with message below. Pt verbalized understanding.

## 2021-02-06 DIAGNOSIS — I509 Heart failure, unspecified: Secondary | ICD-10-CM | POA: Diagnosis not present

## 2021-02-06 DIAGNOSIS — H02834 Dermatochalasis of left upper eyelid: Secondary | ICD-10-CM | POA: Diagnosis not present

## 2021-02-06 DIAGNOSIS — H25813 Combined forms of age-related cataract, bilateral: Secondary | ICD-10-CM | POA: Diagnosis not present

## 2021-02-06 DIAGNOSIS — H25811 Combined forms of age-related cataract, right eye: Secondary | ICD-10-CM | POA: Diagnosis not present

## 2021-02-06 DIAGNOSIS — H259 Unspecified age-related cataract: Secondary | ICD-10-CM | POA: Diagnosis not present

## 2021-02-06 DIAGNOSIS — M069 Rheumatoid arthritis, unspecified: Secondary | ICD-10-CM | POA: Diagnosis not present

## 2021-02-06 DIAGNOSIS — H02831 Dermatochalasis of right upper eyelid: Secondary | ICD-10-CM | POA: Diagnosis not present

## 2021-02-06 DIAGNOSIS — H52223 Regular astigmatism, bilateral: Secondary | ICD-10-CM | POA: Diagnosis not present

## 2021-02-19 DIAGNOSIS — H25812 Combined forms of age-related cataract, left eye: Secondary | ICD-10-CM | POA: Diagnosis not present

## 2021-02-20 DIAGNOSIS — H259 Unspecified age-related cataract: Secondary | ICD-10-CM | POA: Diagnosis not present

## 2021-02-20 DIAGNOSIS — H25812 Combined forms of age-related cataract, left eye: Secondary | ICD-10-CM | POA: Diagnosis not present

## 2021-02-20 DIAGNOSIS — H52223 Regular astigmatism, bilateral: Secondary | ICD-10-CM | POA: Diagnosis not present

## 2021-04-18 DIAGNOSIS — E785 Hyperlipidemia, unspecified: Secondary | ICD-10-CM | POA: Diagnosis not present

## 2021-04-18 DIAGNOSIS — M47819 Spondylosis without myelopathy or radiculopathy, site unspecified: Secondary | ICD-10-CM | POA: Diagnosis not present

## 2021-04-18 DIAGNOSIS — R7302 Impaired glucose tolerance (oral): Secondary | ICD-10-CM | POA: Diagnosis not present

## 2021-04-18 DIAGNOSIS — I255 Ischemic cardiomyopathy: Secondary | ICD-10-CM | POA: Diagnosis not present

## 2021-04-18 DIAGNOSIS — I5022 Chronic systolic (congestive) heart failure: Secondary | ICD-10-CM | POA: Diagnosis not present

## 2021-04-18 DIAGNOSIS — Z0000 Encounter for general adult medical examination without abnormal findings: Secondary | ICD-10-CM | POA: Diagnosis not present

## 2021-04-18 DIAGNOSIS — I25119 Atherosclerotic heart disease of native coronary artery with unspecified angina pectoris: Secondary | ICD-10-CM | POA: Diagnosis not present

## 2021-04-18 DIAGNOSIS — Z79899 Other long term (current) drug therapy: Secondary | ICD-10-CM | POA: Diagnosis not present

## 2021-04-18 DIAGNOSIS — I420 Dilated cardiomyopathy: Secondary | ICD-10-CM | POA: Diagnosis not present

## 2021-05-09 DIAGNOSIS — Z4502 Encounter for adjustment and management of automatic implantable cardiac defibrillator: Secondary | ICD-10-CM | POA: Diagnosis not present

## 2021-05-30 DIAGNOSIS — H524 Presbyopia: Secondary | ICD-10-CM | POA: Diagnosis not present

## 2021-05-30 DIAGNOSIS — Z961 Presence of intraocular lens: Secondary | ICD-10-CM | POA: Diagnosis not present

## 2021-05-30 DIAGNOSIS — H52223 Regular astigmatism, bilateral: Secondary | ICD-10-CM | POA: Diagnosis not present

## 2021-06-12 ENCOUNTER — Telehealth: Payer: Self-pay | Admitting: Oncology

## 2021-06-12 NOTE — Telephone Encounter (Signed)
Called patient regarding rescheduled July appointment due to provider pal, patient has been called and notified.

## 2021-06-27 DIAGNOSIS — M48061 Spinal stenosis, lumbar region without neurogenic claudication: Secondary | ICD-10-CM | POA: Diagnosis not present

## 2021-06-27 DIAGNOSIS — M545 Low back pain, unspecified: Secondary | ICD-10-CM | POA: Diagnosis not present

## 2021-06-27 DIAGNOSIS — Z95810 Presence of automatic (implantable) cardiac defibrillator: Secondary | ICD-10-CM | POA: Diagnosis not present

## 2021-06-27 DIAGNOSIS — I4901 Ventricular fibrillation: Secondary | ICD-10-CM | POA: Diagnosis not present

## 2021-06-27 DIAGNOSIS — I469 Cardiac arrest, cause unspecified: Secondary | ICD-10-CM | POA: Diagnosis not present

## 2021-06-27 DIAGNOSIS — M5136 Other intervertebral disc degeneration, lumbar region: Secondary | ICD-10-CM | POA: Diagnosis not present

## 2021-07-22 DIAGNOSIS — Z4502 Encounter for adjustment and management of automatic implantable cardiac defibrillator: Secondary | ICD-10-CM | POA: Diagnosis not present

## 2021-07-24 DIAGNOSIS — I4901 Ventricular fibrillation: Secondary | ICD-10-CM | POA: Diagnosis not present

## 2021-07-24 DIAGNOSIS — I255 Ischemic cardiomyopathy: Secondary | ICD-10-CM | POA: Diagnosis not present

## 2021-07-24 DIAGNOSIS — I469 Cardiac arrest, cause unspecified: Secondary | ICD-10-CM | POA: Diagnosis not present

## 2021-07-24 DIAGNOSIS — Z95810 Presence of automatic (implantable) cardiac defibrillator: Secondary | ICD-10-CM | POA: Diagnosis not present

## 2021-07-24 DIAGNOSIS — I2510 Atherosclerotic heart disease of native coronary artery without angina pectoris: Secondary | ICD-10-CM | POA: Diagnosis not present

## 2021-07-26 DIAGNOSIS — M549 Dorsalgia, unspecified: Secondary | ICD-10-CM | POA: Diagnosis not present

## 2021-07-26 DIAGNOSIS — G894 Chronic pain syndrome: Secondary | ICD-10-CM | POA: Diagnosis not present

## 2021-07-26 DIAGNOSIS — M4310 Spondylolisthesis, site unspecified: Secondary | ICD-10-CM | POA: Diagnosis not present

## 2021-07-26 DIAGNOSIS — M5136 Other intervertebral disc degeneration, lumbar region: Secondary | ICD-10-CM | POA: Diagnosis not present

## 2021-07-26 DIAGNOSIS — M47816 Spondylosis without myelopathy or radiculopathy, lumbar region: Secondary | ICD-10-CM | POA: Diagnosis not present

## 2021-07-26 DIAGNOSIS — Z1389 Encounter for screening for other disorder: Secondary | ICD-10-CM | POA: Diagnosis not present

## 2021-07-31 ENCOUNTER — Ambulatory Visit: Payer: Medicare HMO | Admitting: Oncology

## 2021-07-31 ENCOUNTER — Other Ambulatory Visit: Payer: Medicare HMO

## 2021-07-31 DIAGNOSIS — M9904 Segmental and somatic dysfunction of sacral region: Secondary | ICD-10-CM | POA: Diagnosis not present

## 2021-07-31 DIAGNOSIS — M4726 Other spondylosis with radiculopathy, lumbar region: Secondary | ICD-10-CM | POA: Diagnosis not present

## 2021-07-31 DIAGNOSIS — M9903 Segmental and somatic dysfunction of lumbar region: Secondary | ICD-10-CM | POA: Diagnosis not present

## 2021-08-01 DIAGNOSIS — M4800 Spinal stenosis, site unspecified: Secondary | ICD-10-CM | POA: Diagnosis not present

## 2021-08-01 DIAGNOSIS — M47819 Spondylosis without myelopathy or radiculopathy, site unspecified: Secondary | ICD-10-CM | POA: Diagnosis not present

## 2021-08-01 DIAGNOSIS — M6281 Muscle weakness (generalized): Secondary | ICD-10-CM | POA: Diagnosis not present

## 2021-08-01 DIAGNOSIS — M5416 Radiculopathy, lumbar region: Secondary | ICD-10-CM | POA: Diagnosis not present

## 2021-08-01 DIAGNOSIS — E663 Overweight: Secondary | ICD-10-CM | POA: Diagnosis not present

## 2021-08-01 DIAGNOSIS — Z6825 Body mass index (BMI) 25.0-25.9, adult: Secondary | ICD-10-CM | POA: Diagnosis not present

## 2021-08-02 DIAGNOSIS — M9904 Segmental and somatic dysfunction of sacral region: Secondary | ICD-10-CM | POA: Diagnosis not present

## 2021-08-02 DIAGNOSIS — M9903 Segmental and somatic dysfunction of lumbar region: Secondary | ICD-10-CM | POA: Diagnosis not present

## 2021-08-02 DIAGNOSIS — M4726 Other spondylosis with radiculopathy, lumbar region: Secondary | ICD-10-CM | POA: Diagnosis not present

## 2021-08-03 DIAGNOSIS — M549 Dorsalgia, unspecified: Secondary | ICD-10-CM | POA: Diagnosis not present

## 2021-08-03 DIAGNOSIS — G894 Chronic pain syndrome: Secondary | ICD-10-CM | POA: Diagnosis not present

## 2021-08-03 DIAGNOSIS — M4310 Spondylolisthesis, site unspecified: Secondary | ICD-10-CM | POA: Diagnosis not present

## 2021-08-03 DIAGNOSIS — M5136 Other intervertebral disc degeneration, lumbar region: Secondary | ICD-10-CM | POA: Diagnosis not present

## 2021-08-03 DIAGNOSIS — Z1389 Encounter for screening for other disorder: Secondary | ICD-10-CM | POA: Diagnosis not present

## 2021-08-03 DIAGNOSIS — M47816 Spondylosis without myelopathy or radiculopathy, lumbar region: Secondary | ICD-10-CM | POA: Diagnosis not present

## 2021-08-06 DIAGNOSIS — M9904 Segmental and somatic dysfunction of sacral region: Secondary | ICD-10-CM | POA: Diagnosis not present

## 2021-08-06 DIAGNOSIS — M9903 Segmental and somatic dysfunction of lumbar region: Secondary | ICD-10-CM | POA: Diagnosis not present

## 2021-08-06 DIAGNOSIS — M4726 Other spondylosis with radiculopathy, lumbar region: Secondary | ICD-10-CM | POA: Diagnosis not present

## 2021-08-07 DIAGNOSIS — M9904 Segmental and somatic dysfunction of sacral region: Secondary | ICD-10-CM | POA: Diagnosis not present

## 2021-08-07 DIAGNOSIS — M4726 Other spondylosis with radiculopathy, lumbar region: Secondary | ICD-10-CM | POA: Diagnosis not present

## 2021-08-07 DIAGNOSIS — M9903 Segmental and somatic dysfunction of lumbar region: Secondary | ICD-10-CM | POA: Diagnosis not present

## 2021-08-09 DIAGNOSIS — M9904 Segmental and somatic dysfunction of sacral region: Secondary | ICD-10-CM | POA: Diagnosis not present

## 2021-08-09 DIAGNOSIS — M9903 Segmental and somatic dysfunction of lumbar region: Secondary | ICD-10-CM | POA: Diagnosis not present

## 2021-08-09 DIAGNOSIS — M4726 Other spondylosis with radiculopathy, lumbar region: Secondary | ICD-10-CM | POA: Diagnosis not present

## 2021-08-13 DIAGNOSIS — M4726 Other spondylosis with radiculopathy, lumbar region: Secondary | ICD-10-CM | POA: Diagnosis not present

## 2021-08-13 DIAGNOSIS — M5416 Radiculopathy, lumbar region: Secondary | ICD-10-CM | POA: Diagnosis not present

## 2021-08-13 DIAGNOSIS — M9903 Segmental and somatic dysfunction of lumbar region: Secondary | ICD-10-CM | POA: Diagnosis not present

## 2021-08-13 DIAGNOSIS — M9904 Segmental and somatic dysfunction of sacral region: Secondary | ICD-10-CM | POA: Diagnosis not present

## 2021-08-14 DIAGNOSIS — M4726 Other spondylosis with radiculopathy, lumbar region: Secondary | ICD-10-CM | POA: Diagnosis not present

## 2021-08-14 DIAGNOSIS — M9904 Segmental and somatic dysfunction of sacral region: Secondary | ICD-10-CM | POA: Diagnosis not present

## 2021-08-14 DIAGNOSIS — M9903 Segmental and somatic dysfunction of lumbar region: Secondary | ICD-10-CM | POA: Diagnosis not present

## 2021-08-15 DIAGNOSIS — M4726 Other spondylosis with radiculopathy, lumbar region: Secondary | ICD-10-CM | POA: Diagnosis not present

## 2021-08-15 DIAGNOSIS — M9904 Segmental and somatic dysfunction of sacral region: Secondary | ICD-10-CM | POA: Diagnosis not present

## 2021-08-15 DIAGNOSIS — M9903 Segmental and somatic dysfunction of lumbar region: Secondary | ICD-10-CM | POA: Diagnosis not present

## 2021-08-20 DIAGNOSIS — M4726 Other spondylosis with radiculopathy, lumbar region: Secondary | ICD-10-CM | POA: Diagnosis not present

## 2021-08-20 DIAGNOSIS — M9903 Segmental and somatic dysfunction of lumbar region: Secondary | ICD-10-CM | POA: Diagnosis not present

## 2021-08-20 DIAGNOSIS — M9904 Segmental and somatic dysfunction of sacral region: Secondary | ICD-10-CM | POA: Diagnosis not present

## 2021-08-23 DIAGNOSIS — M9903 Segmental and somatic dysfunction of lumbar region: Secondary | ICD-10-CM | POA: Diagnosis not present

## 2021-08-23 DIAGNOSIS — M47816 Spondylosis without myelopathy or radiculopathy, lumbar region: Secondary | ICD-10-CM | POA: Diagnosis not present

## 2021-08-23 DIAGNOSIS — M4726 Other spondylosis with radiculopathy, lumbar region: Secondary | ICD-10-CM | POA: Diagnosis not present

## 2021-08-23 DIAGNOSIS — Z1389 Encounter for screening for other disorder: Secondary | ICD-10-CM | POA: Diagnosis not present

## 2021-08-23 DIAGNOSIS — G894 Chronic pain syndrome: Secondary | ICD-10-CM | POA: Diagnosis not present

## 2021-08-23 DIAGNOSIS — M549 Dorsalgia, unspecified: Secondary | ICD-10-CM | POA: Diagnosis not present

## 2021-08-23 DIAGNOSIS — M4310 Spondylolisthesis, site unspecified: Secondary | ICD-10-CM | POA: Diagnosis not present

## 2021-08-23 DIAGNOSIS — M9904 Segmental and somatic dysfunction of sacral region: Secondary | ICD-10-CM | POA: Diagnosis not present

## 2021-08-23 DIAGNOSIS — M5136 Other intervertebral disc degeneration, lumbar region: Secondary | ICD-10-CM | POA: Diagnosis not present

## 2021-08-28 DIAGNOSIS — M9904 Segmental and somatic dysfunction of sacral region: Secondary | ICD-10-CM | POA: Diagnosis not present

## 2021-08-28 DIAGNOSIS — M4726 Other spondylosis with radiculopathy, lumbar region: Secondary | ICD-10-CM | POA: Diagnosis not present

## 2021-08-28 DIAGNOSIS — M9903 Segmental and somatic dysfunction of lumbar region: Secondary | ICD-10-CM | POA: Diagnosis not present

## 2021-08-30 DIAGNOSIS — M9904 Segmental and somatic dysfunction of sacral region: Secondary | ICD-10-CM | POA: Diagnosis not present

## 2021-08-30 DIAGNOSIS — M9903 Segmental and somatic dysfunction of lumbar region: Secondary | ICD-10-CM | POA: Diagnosis not present

## 2021-08-30 DIAGNOSIS — M4726 Other spondylosis with radiculopathy, lumbar region: Secondary | ICD-10-CM | POA: Diagnosis not present

## 2021-09-07 ENCOUNTER — Inpatient Hospital Stay: Payer: Medicare HMO | Admitting: Oncology

## 2021-09-07 ENCOUNTER — Inpatient Hospital Stay: Payer: Medicare HMO | Attending: Oncology

## 2021-09-07 ENCOUNTER — Other Ambulatory Visit: Payer: Self-pay

## 2021-09-07 VITALS — BP 129/59 | HR 82 | Temp 97.9°F | Resp 17 | Ht 66.0 in | Wt 157.4 lb

## 2021-09-07 DIAGNOSIS — C61 Malignant neoplasm of prostate: Secondary | ICD-10-CM

## 2021-09-07 DIAGNOSIS — M549 Dorsalgia, unspecified: Secondary | ICD-10-CM | POA: Diagnosis not present

## 2021-09-07 DIAGNOSIS — Z79899 Other long term (current) drug therapy: Secondary | ICD-10-CM | POA: Insufficient documentation

## 2021-09-07 LAB — CBC WITH DIFFERENTIAL (CANCER CENTER ONLY)
Abs Immature Granulocytes: 0.01 10*3/uL (ref 0.00–0.07)
Basophils Absolute: 0 10*3/uL (ref 0.0–0.1)
Basophils Relative: 1 %
Eosinophils Absolute: 0.5 10*3/uL (ref 0.0–0.5)
Eosinophils Relative: 8 %
HCT: 37.6 % — ABNORMAL LOW (ref 39.0–52.0)
Hemoglobin: 12.3 g/dL — ABNORMAL LOW (ref 13.0–17.0)
Immature Granulocytes: 0 %
Lymphocytes Relative: 18 %
Lymphs Abs: 1.3 10*3/uL (ref 0.7–4.0)
MCH: 29.2 pg (ref 26.0–34.0)
MCHC: 32.7 g/dL (ref 30.0–36.0)
MCV: 89.3 fL (ref 80.0–100.0)
Monocytes Absolute: 0.7 10*3/uL (ref 0.1–1.0)
Monocytes Relative: 10 %
Neutro Abs: 4.4 10*3/uL (ref 1.7–7.7)
Neutrophils Relative %: 63 %
Platelet Count: 184 10*3/uL (ref 150–400)
RBC: 4.21 MIL/uL — ABNORMAL LOW (ref 4.22–5.81)
RDW: 14 % (ref 11.5–15.5)
WBC Count: 6.9 10*3/uL (ref 4.0–10.5)
nRBC: 0 % (ref 0.0–0.2)

## 2021-09-07 LAB — CMP (CANCER CENTER ONLY)
ALT: 8 U/L (ref 0–44)
AST: 16 U/L (ref 15–41)
Albumin: 3.9 g/dL (ref 3.5–5.0)
Alkaline Phosphatase: 44 U/L (ref 38–126)
Anion gap: 3 — ABNORMAL LOW (ref 5–15)
BUN: 22 mg/dL (ref 8–23)
CO2: 28 mmol/L (ref 22–32)
Calcium: 9 mg/dL (ref 8.9–10.3)
Chloride: 111 mmol/L (ref 98–111)
Creatinine: 1.3 mg/dL — ABNORMAL HIGH (ref 0.61–1.24)
GFR, Estimated: 56 mL/min — ABNORMAL LOW (ref >60)
Glucose, Bld: 101 mg/dL — ABNORMAL HIGH (ref 70–99)
Potassium: 4.1 mmol/L (ref 3.5–5.1)
Sodium: 142 mmol/L (ref 135–145)
Total Bilirubin: 0.5 mg/dL (ref 0.3–1.2)
Total Protein: 6.1 g/dL — ABNORMAL LOW (ref 6.5–8.1)

## 2021-09-07 NOTE — Progress Notes (Signed)
Hematology and Oncology Follow Up Visit  Jesse Mann [id number] August 09, 1940 81 y.o. 09/07/2021 2:25 PM [address], [name], MDStreet, [name], *   Principle Diagnosis: 84-year man with prostate cancer diagnosed in 2016 with Gleason score 9 and T3BN1 pathological staging.  He developed castration-sensitive with biochemical relapse noted in 2017.     Prior Therapy:  He is status post radical prostatectomy done on 03/03/2015 utilizing a nerve sparing approach. 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 Tumor involves bilateral seminal vesicles with margins involved with cancer. 2 lymph nodes were involved for a total of 8 sampled.    He developed biochemical relapse in July 2017 with a PSA of 1.28.  He was treated Lupron 30 mg injection given every 4 months first dose given on 08/08/2015 he completed 2 years of therapy in 2019.  He opted against radiation at that time.  Current therapy: Intermittent androgen deprivation.  Interim History: Mr. Jesse Mann returns today for a follow-up visit.  Since last visit, he reports feeling well without any major complaints.  He has reported chronic back pain related to arthritis currently under evaluation.  He denies any abdominal discomfort or urinary complaints.  Pulm status quality of life remains unchanged.    Medications: Reviewed without changes. Current Outpatient Medications  Medication Sig Dispense Refill   acetaminophen (TYLENOL) 500 MG tablet Take 500 mg by mouth every 6 (six) hours as needed (for pain.).     amiodarone (PACERONE) 200 MG tablet Take 200 mg by mouth daily. 0.5 tab (100 mg total) po daily     aspirin 81 MG tablet Take 81 mg by mouth daily.     carvedilol (COREG) 12.5 MG tablet Take 6.25 mg by mouth 2 (two) times daily with a meal.      famotidine (PEPCID) 40 MG tablet Take 40 mg at bedtime by mouth.     losartan (COZAAR) 50 MG tablet Take 50 mg by mouth daily.     nitroGLYCERIN  (NITROSTAT) 0.4 MG SL tablet Place 0.4 mg under the tongue every 5 (five) minutes as needed for chest pain.      rosuvastatin (CRESTOR) 10 MG tablet Take 1 tablet by mouth daily.     No current facility-administered medications for this visit.     Allergies: No Known Allergies     Physical Exam:     Blood pressure (!) 129/59, pulse 82, temperature 97.9 F (36.6 C), temperature source Tympanic, resp. rate 17, height '5\' 6"'$  (1.676 m), weight 157 lb 6.4 oz (71.4 kg), SpO2 98 %.       ECOG: 1   General appearance: Comfortable appearing without any discomfort Head: Normocephalic without any trauma Oropharynx: Mucous membranes are moist and pink without any thrush or ulcers. Eyes: Pupils are equal and round reactive to light. Lymph nodes: No cervical, supraclavicular, inguinal or axillary lymphadenopathy.   Heart:regular rate and rhythm.  S1 and S2 without leg edema. Lung: Clear without any rhonchi or wheezes.  No dullness to percussion. Abdomin: Soft, nontender, nondistended with good bowel sounds.  No hepatosplenomegaly. Musculoskeletal: No joint deformity or effusion.  Full range of motion noted. Neurological: No deficits noted on motor, sensory and deep tendon reflex exam. Skin: No petechial rash or dryness.  Appeared moist.             Lab Results: Lab Results  Component Value Date   WBC 4.9 01/30/2021   HGB 12.5 (L) 01/30/2021   HCT 38.8 (L) 01/30/2021  MCV 89.4 01/30/2021   PLT 214 01/30/2021     Chemistry      Component Value Date/Time   NA 139 01/30/2021 0916   NA 140 11/27/2016 1107   K 4.5 01/30/2021 0916   K 4.4 11/27/2016 1107   CL 107 01/30/2021 0916   CO2 28 01/30/2021 0916   CO2 27 11/27/2016 1107   BUN 18 01/30/2021 0916   BUN 18.9 11/27/2016 1107   CREATININE 1.31 (H) 01/30/2021 0916   CREATININE 1.2 11/27/2016 1107      Component Value Date/Time   CALCIUM 9.1 01/30/2021 0916   CALCIUM 9.0 11/27/2016 1107   ALKPHOS 45 01/30/2021  0916   ALKPHOS 72 11/27/2016 1107   AST 16 01/30/2021 0916   AST 21 11/27/2016 1107   ALT 7 01/30/2021 0916   ALT 19 11/27/2016 1107   BILITOT 0.6 01/30/2021 0916   BILITOT 0.42 11/27/2016 1107              Impression and Plan:   81 year old man with:  1.  Biochemical relapse of prostate cancer was diagnosed in 2016.  He has castration-sensitive disease.  He is currently on intermittent androgen deprivation with PSA currently undetectable.  Risks and benefits of continuing this approach with restarting androgen depravation options were discussed.  At this time he is agreeable.   2. Bone health: I recommended continued calcium vitamin D supplement given his risk of osteoporosis.  3.  Back pain: Unrelated to his prostate cancer currently under evaluation. An MRI in the near future.  4.  Follow-up: He will return in 6 months for a follow-up.  20  minutes were dedicated to this visit.  The time was spent on reviewing laboratory data, disease status update and outlining future plan of care discussion.  9/1/20232:25 PM

## 2021-09-08 LAB — PROSTATE-SPECIFIC AG, SERUM (LABCORP): Prostate Specific Ag, Serum: 0.1 ng/mL (ref 0.0–4.0)

## 2021-09-11 ENCOUNTER — Telehealth: Payer: Self-pay | Admitting: *Deleted

## 2021-09-11 NOTE — Telephone Encounter (Signed)
PC to patient, informed him his PSA is <0.1, he verbalizes understanding. 

## 2021-09-11 NOTE — Telephone Encounter (Signed)
-----   Message from [name], MD sent at 09/11/2021  8:51 AM EDT ----- Please let him know his PSA is still low

## 2021-10-16 ENCOUNTER — Other Ambulatory Visit: Payer: Self-pay | Admitting: Family Medicine

## 2021-10-16 DIAGNOSIS — M47819 Spondylosis without myelopathy or radiculopathy, site unspecified: Secondary | ICD-10-CM

## 2021-10-16 DIAGNOSIS — M6281 Muscle weakness (generalized): Secondary | ICD-10-CM

## 2021-10-16 DIAGNOSIS — M4800 Spinal stenosis, site unspecified: Secondary | ICD-10-CM

## 2021-10-17 ENCOUNTER — Other Ambulatory Visit (HOSPITAL_COMMUNITY): Payer: Self-pay | Admitting: Family Medicine

## 2021-10-17 DIAGNOSIS — M6281 Muscle weakness (generalized): Secondary | ICD-10-CM

## 2021-10-17 DIAGNOSIS — M47819 Spondylosis without myelopathy or radiculopathy, site unspecified: Secondary | ICD-10-CM

## 2021-10-17 DIAGNOSIS — M4800 Spinal stenosis, site unspecified: Secondary | ICD-10-CM

## 2021-10-21 DIAGNOSIS — Z4502 Encounter for adjustment and management of automatic implantable cardiac defibrillator: Secondary | ICD-10-CM | POA: Diagnosis not present

## 2021-10-31 DIAGNOSIS — M48061 Spinal stenosis, lumbar region without neurogenic claudication: Secondary | ICD-10-CM | POA: Diagnosis not present

## 2021-10-31 DIAGNOSIS — R29898 Other symptoms and signs involving the musculoskeletal system: Secondary | ICD-10-CM | POA: Diagnosis not present

## 2021-10-31 DIAGNOSIS — M47814 Spondylosis without myelopathy or radiculopathy, thoracic region: Secondary | ICD-10-CM | POA: Diagnosis not present

## 2021-10-31 DIAGNOSIS — M5126 Other intervertebral disc displacement, lumbar region: Secondary | ICD-10-CM | POA: Diagnosis not present

## 2021-10-31 DIAGNOSIS — M47812 Spondylosis without myelopathy or radiculopathy, cervical region: Secondary | ICD-10-CM | POA: Diagnosis not present

## 2021-11-15 DIAGNOSIS — M48062 Spinal stenosis, lumbar region with neurogenic claudication: Secondary | ICD-10-CM | POA: Diagnosis not present

## 2021-12-03 ENCOUNTER — Encounter (HOSPITAL_COMMUNITY): Payer: Self-pay

## 2021-12-03 ENCOUNTER — Ambulatory Visit (HOSPITAL_COMMUNITY): Payer: Medicare HMO

## 2021-12-28 DIAGNOSIS — M4726 Other spondylosis with radiculopathy, lumbar region: Secondary | ICD-10-CM | POA: Diagnosis not present

## 2021-12-28 DIAGNOSIS — Z6825 Body mass index (BMI) 25.0-25.9, adult: Secondary | ICD-10-CM | POA: Diagnosis not present

## 2021-12-28 DIAGNOSIS — M48062 Spinal stenosis, lumbar region with neurogenic claudication: Secondary | ICD-10-CM | POA: Diagnosis not present

## 2021-12-28 DIAGNOSIS — M4316 Spondylolisthesis, lumbar region: Secondary | ICD-10-CM | POA: Diagnosis not present

## 2022-01-11 DIAGNOSIS — I517 Cardiomegaly: Secondary | ICD-10-CM | POA: Diagnosis not present

## 2022-01-11 DIAGNOSIS — R791 Abnormal coagulation profile: Secondary | ICD-10-CM | POA: Diagnosis not present

## 2022-01-11 DIAGNOSIS — M4316 Spondylolisthesis, lumbar region: Secondary | ICD-10-CM | POA: Diagnosis not present

## 2022-01-11 DIAGNOSIS — M48062 Spinal stenosis, lumbar region with neurogenic claudication: Secondary | ICD-10-CM | POA: Diagnosis not present

## 2022-01-11 DIAGNOSIS — I493 Ventricular premature depolarization: Secondary | ICD-10-CM | POA: Diagnosis not present

## 2022-01-11 DIAGNOSIS — I444 Left anterior fascicular block: Secondary | ICD-10-CM | POA: Diagnosis not present

## 2022-01-11 DIAGNOSIS — M4726 Other spondylosis with radiculopathy, lumbar region: Secondary | ICD-10-CM | POA: Diagnosis not present

## 2022-01-11 DIAGNOSIS — Z01812 Encounter for preprocedural laboratory examination: Secondary | ICD-10-CM | POA: Diagnosis not present

## 2022-01-11 DIAGNOSIS — Z01810 Encounter for preprocedural cardiovascular examination: Secondary | ICD-10-CM | POA: Diagnosis not present

## 2022-01-11 DIAGNOSIS — N390 Urinary tract infection, site not specified: Secondary | ICD-10-CM | POA: Diagnosis not present

## 2022-01-14 DIAGNOSIS — Z0389 Encounter for observation for other suspected diseases and conditions ruled out: Secondary | ICD-10-CM | POA: Diagnosis not present

## 2022-01-14 DIAGNOSIS — M4726 Other spondylosis with radiculopathy, lumbar region: Secondary | ICD-10-CM | POA: Diagnosis not present

## 2022-01-14 DIAGNOSIS — Z981 Arthrodesis status: Secondary | ICD-10-CM | POA: Diagnosis not present

## 2022-01-14 DIAGNOSIS — I252 Old myocardial infarction: Secondary | ICD-10-CM | POA: Diagnosis not present

## 2022-01-14 DIAGNOSIS — Z95810 Presence of automatic (implantable) cardiac defibrillator: Secondary | ICD-10-CM | POA: Diagnosis not present

## 2022-01-14 DIAGNOSIS — M5432 Sciatica, left side: Secondary | ICD-10-CM | POA: Diagnosis not present

## 2022-01-14 DIAGNOSIS — M4316 Spondylolisthesis, lumbar region: Secondary | ICD-10-CM | POA: Diagnosis not present

## 2022-01-14 DIAGNOSIS — I2510 Atherosclerotic heart disease of native coronary artery without angina pectoris: Secondary | ICD-10-CM | POA: Diagnosis not present

## 2022-01-14 DIAGNOSIS — Z87891 Personal history of nicotine dependence: Secondary | ICD-10-CM | POA: Diagnosis not present

## 2022-01-14 DIAGNOSIS — M5431 Sciatica, right side: Secondary | ICD-10-CM | POA: Diagnosis not present

## 2022-01-14 DIAGNOSIS — M48062 Spinal stenosis, lumbar region with neurogenic claudication: Secondary | ICD-10-CM | POA: Diagnosis not present

## 2022-01-14 DIAGNOSIS — M4326 Fusion of spine, lumbar region: Secondary | ICD-10-CM | POA: Diagnosis not present

## 2022-01-15 DIAGNOSIS — Z87891 Personal history of nicotine dependence: Secondary | ICD-10-CM | POA: Diagnosis not present

## 2022-01-15 DIAGNOSIS — M4316 Spondylolisthesis, lumbar region: Secondary | ICD-10-CM | POA: Diagnosis not present

## 2022-01-15 DIAGNOSIS — I252 Old myocardial infarction: Secondary | ICD-10-CM | POA: Diagnosis not present

## 2022-01-15 DIAGNOSIS — I2510 Atherosclerotic heart disease of native coronary artery without angina pectoris: Secondary | ICD-10-CM | POA: Diagnosis not present

## 2022-01-15 DIAGNOSIS — M4726 Other spondylosis with radiculopathy, lumbar region: Secondary | ICD-10-CM | POA: Diagnosis not present

## 2022-01-15 DIAGNOSIS — M47816 Spondylosis without myelopathy or radiculopathy, lumbar region: Secondary | ICD-10-CM | POA: Diagnosis not present

## 2022-01-15 DIAGNOSIS — Z95810 Presence of automatic (implantable) cardiac defibrillator: Secondary | ICD-10-CM | POA: Diagnosis not present

## 2022-01-15 DIAGNOSIS — M48062 Spinal stenosis, lumbar region with neurogenic claudication: Secondary | ICD-10-CM | POA: Diagnosis not present

## 2022-01-18 ENCOUNTER — Telehealth: Payer: Self-pay | Admitting: Oncology

## 2022-01-18 DIAGNOSIS — M48062 Spinal stenosis, lumbar region with neurogenic claudication: Secondary | ICD-10-CM | POA: Diagnosis not present

## 2022-01-18 DIAGNOSIS — I255 Ischemic cardiomyopathy: Secondary | ICD-10-CM | POA: Diagnosis not present

## 2022-01-18 DIAGNOSIS — M5116 Intervertebral disc disorders with radiculopathy, lumbar region: Secondary | ICD-10-CM | POA: Diagnosis not present

## 2022-01-18 DIAGNOSIS — M4726 Other spondylosis with radiculopathy, lumbar region: Secondary | ICD-10-CM | POA: Diagnosis not present

## 2022-01-18 DIAGNOSIS — Z4789 Encounter for other orthopedic aftercare: Secondary | ICD-10-CM | POA: Diagnosis not present

## 2022-01-18 DIAGNOSIS — M4316 Spondylolisthesis, lumbar region: Secondary | ICD-10-CM | POA: Diagnosis not present

## 2022-01-18 DIAGNOSIS — I2510 Atherosclerotic heart disease of native coronary artery without angina pectoris: Secondary | ICD-10-CM | POA: Diagnosis not present

## 2022-01-18 DIAGNOSIS — I252 Old myocardial infarction: Secondary | ICD-10-CM | POA: Diagnosis not present

## 2022-01-18 DIAGNOSIS — E7800 Pure hypercholesterolemia, unspecified: Secondary | ICD-10-CM | POA: Diagnosis not present

## 2022-01-18 NOTE — Telephone Encounter (Signed)
Called patient regarding providers departure, patient has been notified. Rescheduled patient with new provider.

## 2022-01-24 DIAGNOSIS — M5116 Intervertebral disc disorders with radiculopathy, lumbar region: Secondary | ICD-10-CM | POA: Diagnosis not present

## 2022-01-24 DIAGNOSIS — M48062 Spinal stenosis, lumbar region with neurogenic claudication: Secondary | ICD-10-CM | POA: Diagnosis not present

## 2022-01-24 DIAGNOSIS — I252 Old myocardial infarction: Secondary | ICD-10-CM | POA: Diagnosis not present

## 2022-01-24 DIAGNOSIS — E7800 Pure hypercholesterolemia, unspecified: Secondary | ICD-10-CM | POA: Diagnosis not present

## 2022-01-24 DIAGNOSIS — M4316 Spondylolisthesis, lumbar region: Secondary | ICD-10-CM | POA: Diagnosis not present

## 2022-01-24 DIAGNOSIS — I255 Ischemic cardiomyopathy: Secondary | ICD-10-CM | POA: Diagnosis not present

## 2022-01-24 DIAGNOSIS — Z4789 Encounter for other orthopedic aftercare: Secondary | ICD-10-CM | POA: Diagnosis not present

## 2022-01-24 DIAGNOSIS — I2510 Atherosclerotic heart disease of native coronary artery without angina pectoris: Secondary | ICD-10-CM | POA: Diagnosis not present

## 2022-01-24 DIAGNOSIS — M4726 Other spondylosis with radiculopathy, lumbar region: Secondary | ICD-10-CM | POA: Diagnosis not present

## 2022-01-28 DIAGNOSIS — M5116 Intervertebral disc disorders with radiculopathy, lumbar region: Secondary | ICD-10-CM | POA: Diagnosis not present

## 2022-01-28 DIAGNOSIS — I252 Old myocardial infarction: Secondary | ICD-10-CM | POA: Diagnosis not present

## 2022-01-28 DIAGNOSIS — I2510 Atherosclerotic heart disease of native coronary artery without angina pectoris: Secondary | ICD-10-CM | POA: Diagnosis not present

## 2022-01-28 DIAGNOSIS — M4726 Other spondylosis with radiculopathy, lumbar region: Secondary | ICD-10-CM | POA: Diagnosis not present

## 2022-01-28 DIAGNOSIS — M48062 Spinal stenosis, lumbar region with neurogenic claudication: Secondary | ICD-10-CM | POA: Diagnosis not present

## 2022-01-28 DIAGNOSIS — M4316 Spondylolisthesis, lumbar region: Secondary | ICD-10-CM | POA: Diagnosis not present

## 2022-01-28 DIAGNOSIS — Z4789 Encounter for other orthopedic aftercare: Secondary | ICD-10-CM | POA: Diagnosis not present

## 2022-01-28 DIAGNOSIS — I255 Ischemic cardiomyopathy: Secondary | ICD-10-CM | POA: Diagnosis not present

## 2022-01-28 DIAGNOSIS — E7800 Pure hypercholesterolemia, unspecified: Secondary | ICD-10-CM | POA: Diagnosis not present

## 2022-01-31 DIAGNOSIS — I252 Old myocardial infarction: Secondary | ICD-10-CM | POA: Diagnosis not present

## 2022-01-31 DIAGNOSIS — M4726 Other spondylosis with radiculopathy, lumbar region: Secondary | ICD-10-CM | POA: Diagnosis not present

## 2022-01-31 DIAGNOSIS — E7800 Pure hypercholesterolemia, unspecified: Secondary | ICD-10-CM | POA: Diagnosis not present

## 2022-01-31 DIAGNOSIS — I255 Ischemic cardiomyopathy: Secondary | ICD-10-CM | POA: Diagnosis not present

## 2022-01-31 DIAGNOSIS — M48062 Spinal stenosis, lumbar region with neurogenic claudication: Secondary | ICD-10-CM | POA: Diagnosis not present

## 2022-01-31 DIAGNOSIS — M4316 Spondylolisthesis, lumbar region: Secondary | ICD-10-CM | POA: Diagnosis not present

## 2022-01-31 DIAGNOSIS — I2510 Atherosclerotic heart disease of native coronary artery without angina pectoris: Secondary | ICD-10-CM | POA: Diagnosis not present

## 2022-01-31 DIAGNOSIS — Z4789 Encounter for other orthopedic aftercare: Secondary | ICD-10-CM | POA: Diagnosis not present

## 2022-01-31 DIAGNOSIS — M5116 Intervertebral disc disorders with radiculopathy, lumbar region: Secondary | ICD-10-CM | POA: Diagnosis not present

## 2022-02-03 DIAGNOSIS — I509 Heart failure, unspecified: Secondary | ICD-10-CM | POA: Diagnosis not present

## 2022-02-03 DIAGNOSIS — Z4502 Encounter for adjustment and management of automatic implantable cardiac defibrillator: Secondary | ICD-10-CM | POA: Diagnosis not present

## 2022-02-04 DIAGNOSIS — I340 Nonrheumatic mitral (valve) insufficiency: Secondary | ICD-10-CM | POA: Diagnosis not present

## 2022-02-04 DIAGNOSIS — I255 Ischemic cardiomyopathy: Secondary | ICD-10-CM | POA: Diagnosis not present

## 2022-02-04 DIAGNOSIS — I2510 Atherosclerotic heart disease of native coronary artery without angina pectoris: Secondary | ICD-10-CM | POA: Diagnosis not present

## 2022-02-05 DIAGNOSIS — Z4789 Encounter for other orthopedic aftercare: Secondary | ICD-10-CM | POA: Diagnosis not present

## 2022-02-05 DIAGNOSIS — I252 Old myocardial infarction: Secondary | ICD-10-CM | POA: Diagnosis not present

## 2022-02-05 DIAGNOSIS — E7800 Pure hypercholesterolemia, unspecified: Secondary | ICD-10-CM | POA: Diagnosis not present

## 2022-02-05 DIAGNOSIS — I2510 Atherosclerotic heart disease of native coronary artery without angina pectoris: Secondary | ICD-10-CM | POA: Diagnosis not present

## 2022-02-05 DIAGNOSIS — I255 Ischemic cardiomyopathy: Secondary | ICD-10-CM | POA: Diagnosis not present

## 2022-02-05 DIAGNOSIS — M4316 Spondylolisthesis, lumbar region: Secondary | ICD-10-CM | POA: Diagnosis not present

## 2022-02-05 DIAGNOSIS — M48062 Spinal stenosis, lumbar region with neurogenic claudication: Secondary | ICD-10-CM | POA: Diagnosis not present

## 2022-02-05 DIAGNOSIS — M5116 Intervertebral disc disorders with radiculopathy, lumbar region: Secondary | ICD-10-CM | POA: Diagnosis not present

## 2022-02-05 DIAGNOSIS — M4726 Other spondylosis with radiculopathy, lumbar region: Secondary | ICD-10-CM | POA: Diagnosis not present

## 2022-02-07 DIAGNOSIS — M5116 Intervertebral disc disorders with radiculopathy, lumbar region: Secondary | ICD-10-CM | POA: Diagnosis not present

## 2022-02-07 DIAGNOSIS — I255 Ischemic cardiomyopathy: Secondary | ICD-10-CM | POA: Diagnosis not present

## 2022-02-07 DIAGNOSIS — I252 Old myocardial infarction: Secondary | ICD-10-CM | POA: Diagnosis not present

## 2022-02-07 DIAGNOSIS — M4316 Spondylolisthesis, lumbar region: Secondary | ICD-10-CM | POA: Diagnosis not present

## 2022-02-07 DIAGNOSIS — M48062 Spinal stenosis, lumbar region with neurogenic claudication: Secondary | ICD-10-CM | POA: Diagnosis not present

## 2022-02-07 DIAGNOSIS — I2510 Atherosclerotic heart disease of native coronary artery without angina pectoris: Secondary | ICD-10-CM | POA: Diagnosis not present

## 2022-02-07 DIAGNOSIS — M4726 Other spondylosis with radiculopathy, lumbar region: Secondary | ICD-10-CM | POA: Diagnosis not present

## 2022-02-07 DIAGNOSIS — Z4789 Encounter for other orthopedic aftercare: Secondary | ICD-10-CM | POA: Diagnosis not present

## 2022-02-07 DIAGNOSIS — E7800 Pure hypercholesterolemia, unspecified: Secondary | ICD-10-CM | POA: Diagnosis not present

## 2022-02-11 ENCOUNTER — Other Ambulatory Visit: Payer: Self-pay | Admitting: Oncology

## 2022-02-11 DIAGNOSIS — I2510 Atherosclerotic heart disease of native coronary artery without angina pectoris: Secondary | ICD-10-CM | POA: Diagnosis not present

## 2022-02-11 DIAGNOSIS — M5116 Intervertebral disc disorders with radiculopathy, lumbar region: Secondary | ICD-10-CM | POA: Diagnosis not present

## 2022-02-11 DIAGNOSIS — I252 Old myocardial infarction: Secondary | ICD-10-CM | POA: Diagnosis not present

## 2022-02-11 DIAGNOSIS — E7800 Pure hypercholesterolemia, unspecified: Secondary | ICD-10-CM | POA: Diagnosis not present

## 2022-02-11 DIAGNOSIS — M48062 Spinal stenosis, lumbar region with neurogenic claudication: Secondary | ICD-10-CM | POA: Diagnosis not present

## 2022-02-11 DIAGNOSIS — I255 Ischemic cardiomyopathy: Secondary | ICD-10-CM | POA: Diagnosis not present

## 2022-02-11 DIAGNOSIS — M4316 Spondylolisthesis, lumbar region: Secondary | ICD-10-CM | POA: Diagnosis not present

## 2022-02-11 DIAGNOSIS — Z4789 Encounter for other orthopedic aftercare: Secondary | ICD-10-CM | POA: Diagnosis not present

## 2022-02-11 DIAGNOSIS — M4726 Other spondylosis with radiculopathy, lumbar region: Secondary | ICD-10-CM | POA: Diagnosis not present

## 2022-02-12 DIAGNOSIS — M48062 Spinal stenosis, lumbar region with neurogenic claudication: Secondary | ICD-10-CM | POA: Diagnosis not present

## 2022-02-15 DIAGNOSIS — E7800 Pure hypercholesterolemia, unspecified: Secondary | ICD-10-CM | POA: Diagnosis not present

## 2022-02-15 DIAGNOSIS — M5116 Intervertebral disc disorders with radiculopathy, lumbar region: Secondary | ICD-10-CM | POA: Diagnosis not present

## 2022-02-15 DIAGNOSIS — M4726 Other spondylosis with radiculopathy, lumbar region: Secondary | ICD-10-CM | POA: Diagnosis not present

## 2022-02-15 DIAGNOSIS — M48062 Spinal stenosis, lumbar region with neurogenic claudication: Secondary | ICD-10-CM | POA: Diagnosis not present

## 2022-02-15 DIAGNOSIS — I252 Old myocardial infarction: Secondary | ICD-10-CM | POA: Diagnosis not present

## 2022-02-15 DIAGNOSIS — I255 Ischemic cardiomyopathy: Secondary | ICD-10-CM | POA: Diagnosis not present

## 2022-02-15 DIAGNOSIS — I2510 Atherosclerotic heart disease of native coronary artery without angina pectoris: Secondary | ICD-10-CM | POA: Diagnosis not present

## 2022-02-15 DIAGNOSIS — M4316 Spondylolisthesis, lumbar region: Secondary | ICD-10-CM | POA: Diagnosis not present

## 2022-02-15 DIAGNOSIS — Z4789 Encounter for other orthopedic aftercare: Secondary | ICD-10-CM | POA: Diagnosis not present

## 2022-02-17 DIAGNOSIS — M4726 Other spondylosis with radiculopathy, lumbar region: Secondary | ICD-10-CM | POA: Diagnosis not present

## 2022-02-17 DIAGNOSIS — M4316 Spondylolisthesis, lumbar region: Secondary | ICD-10-CM | POA: Diagnosis not present

## 2022-02-17 DIAGNOSIS — M48062 Spinal stenosis, lumbar region with neurogenic claudication: Secondary | ICD-10-CM | POA: Diagnosis not present

## 2022-02-17 DIAGNOSIS — M5116 Intervertebral disc disorders with radiculopathy, lumbar region: Secondary | ICD-10-CM | POA: Diagnosis not present

## 2022-02-17 DIAGNOSIS — I252 Old myocardial infarction: Secondary | ICD-10-CM | POA: Diagnosis not present

## 2022-02-17 DIAGNOSIS — I2510 Atherosclerotic heart disease of native coronary artery without angina pectoris: Secondary | ICD-10-CM | POA: Diagnosis not present

## 2022-02-17 DIAGNOSIS — Z4789 Encounter for other orthopedic aftercare: Secondary | ICD-10-CM | POA: Diagnosis not present

## 2022-02-17 DIAGNOSIS — I255 Ischemic cardiomyopathy: Secondary | ICD-10-CM | POA: Diagnosis not present

## 2022-02-17 DIAGNOSIS — E7800 Pure hypercholesterolemia, unspecified: Secondary | ICD-10-CM | POA: Diagnosis not present

## 2022-02-23 DIAGNOSIS — M5116 Intervertebral disc disorders with radiculopathy, lumbar region: Secondary | ICD-10-CM | POA: Diagnosis not present

## 2022-02-23 DIAGNOSIS — I252 Old myocardial infarction: Secondary | ICD-10-CM | POA: Diagnosis not present

## 2022-02-23 DIAGNOSIS — M4316 Spondylolisthesis, lumbar region: Secondary | ICD-10-CM | POA: Diagnosis not present

## 2022-02-23 DIAGNOSIS — Z4789 Encounter for other orthopedic aftercare: Secondary | ICD-10-CM | POA: Diagnosis not present

## 2022-02-23 DIAGNOSIS — M4726 Other spondylosis with radiculopathy, lumbar region: Secondary | ICD-10-CM | POA: Diagnosis not present

## 2022-02-23 DIAGNOSIS — I255 Ischemic cardiomyopathy: Secondary | ICD-10-CM | POA: Diagnosis not present

## 2022-02-23 DIAGNOSIS — I2510 Atherosclerotic heart disease of native coronary artery without angina pectoris: Secondary | ICD-10-CM | POA: Diagnosis not present

## 2022-02-23 DIAGNOSIS — M48062 Spinal stenosis, lumbar region with neurogenic claudication: Secondary | ICD-10-CM | POA: Diagnosis not present

## 2022-02-23 DIAGNOSIS — E7800 Pure hypercholesterolemia, unspecified: Secondary | ICD-10-CM | POA: Diagnosis not present

## 2022-02-27 DIAGNOSIS — I252 Old myocardial infarction: Secondary | ICD-10-CM | POA: Diagnosis not present

## 2022-02-27 DIAGNOSIS — M4726 Other spondylosis with radiculopathy, lumbar region: Secondary | ICD-10-CM | POA: Diagnosis not present

## 2022-02-27 DIAGNOSIS — M48062 Spinal stenosis, lumbar region with neurogenic claudication: Secondary | ICD-10-CM | POA: Diagnosis not present

## 2022-02-27 DIAGNOSIS — Z4789 Encounter for other orthopedic aftercare: Secondary | ICD-10-CM | POA: Diagnosis not present

## 2022-02-27 DIAGNOSIS — E7800 Pure hypercholesterolemia, unspecified: Secondary | ICD-10-CM | POA: Diagnosis not present

## 2022-02-27 DIAGNOSIS — M5116 Intervertebral disc disorders with radiculopathy, lumbar region: Secondary | ICD-10-CM | POA: Diagnosis not present

## 2022-02-27 DIAGNOSIS — M4316 Spondylolisthesis, lumbar region: Secondary | ICD-10-CM | POA: Diagnosis not present

## 2022-02-27 DIAGNOSIS — I2510 Atherosclerotic heart disease of native coronary artery without angina pectoris: Secondary | ICD-10-CM | POA: Diagnosis not present

## 2022-02-27 DIAGNOSIS — I255 Ischemic cardiomyopathy: Secondary | ICD-10-CM | POA: Diagnosis not present

## 2022-03-06 ENCOUNTER — Ambulatory Visit: Payer: Medicare HMO | Admitting: Oncology

## 2022-03-06 ENCOUNTER — Other Ambulatory Visit: Payer: Medicare HMO

## 2022-03-06 DIAGNOSIS — T82897A Other specified complication of cardiac prosthetic devices, implants and grafts, initial encounter: Secondary | ICD-10-CM | POA: Diagnosis not present

## 2022-03-06 DIAGNOSIS — Z95810 Presence of automatic (implantable) cardiac defibrillator: Secondary | ICD-10-CM | POA: Diagnosis not present

## 2022-03-06 DIAGNOSIS — I4901 Ventricular fibrillation: Secondary | ICD-10-CM | POA: Diagnosis not present

## 2022-03-06 DIAGNOSIS — I472 Ventricular tachycardia, unspecified: Secondary | ICD-10-CM | POA: Diagnosis not present

## 2022-03-06 DIAGNOSIS — K219 Gastro-esophageal reflux disease without esophagitis: Secondary | ICD-10-CM | POA: Diagnosis not present

## 2022-03-06 DIAGNOSIS — I083 Combined rheumatic disorders of mitral, aortic and tricuspid valves: Secondary | ICD-10-CM | POA: Diagnosis not present

## 2022-03-06 DIAGNOSIS — Z8674 Personal history of sudden cardiac arrest: Secondary | ICD-10-CM | POA: Diagnosis not present

## 2022-03-06 DIAGNOSIS — E878 Other disorders of electrolyte and fluid balance, not elsewhere classified: Secondary | ICD-10-CM | POA: Diagnosis not present

## 2022-03-06 DIAGNOSIS — I493 Ventricular premature depolarization: Secondary | ICD-10-CM | POA: Diagnosis not present

## 2022-03-06 DIAGNOSIS — Z87891 Personal history of nicotine dependence: Secondary | ICD-10-CM | POA: Diagnosis not present

## 2022-03-06 DIAGNOSIS — I10 Essential (primary) hypertension: Secondary | ICD-10-CM | POA: Diagnosis not present

## 2022-03-06 DIAGNOSIS — I2582 Chronic total occlusion of coronary artery: Secondary | ICD-10-CM | POA: Diagnosis not present

## 2022-03-06 DIAGNOSIS — I2510 Atherosclerotic heart disease of native coronary artery without angina pectoris: Secondary | ICD-10-CM | POA: Diagnosis not present

## 2022-03-06 DIAGNOSIS — E7800 Pure hypercholesterolemia, unspecified: Secondary | ICD-10-CM | POA: Diagnosis not present

## 2022-03-06 DIAGNOSIS — I440 Atrioventricular block, first degree: Secondary | ICD-10-CM | POA: Diagnosis not present

## 2022-03-06 DIAGNOSIS — I255 Ischemic cardiomyopathy: Secondary | ICD-10-CM | POA: Diagnosis not present

## 2022-03-11 ENCOUNTER — Inpatient Hospital Stay: Payer: Medicare HMO | Attending: Oncology | Admitting: Oncology

## 2022-03-11 ENCOUNTER — Inpatient Hospital Stay: Payer: Medicare HMO

## 2022-03-11 ENCOUNTER — Encounter: Payer: Self-pay | Admitting: Oncology

## 2022-03-11 ENCOUNTER — Telehealth: Payer: Self-pay | Admitting: Oncology

## 2022-03-11 VITALS — BP 124/93 | HR 75 | Temp 98.7°F | Resp 18 | Wt 155.3 lb

## 2022-03-11 DIAGNOSIS — C61 Malignant neoplasm of prostate: Secondary | ICD-10-CM

## 2022-03-11 DIAGNOSIS — E538 Deficiency of other specified B group vitamins: Secondary | ICD-10-CM | POA: Diagnosis not present

## 2022-03-11 DIAGNOSIS — D649 Anemia, unspecified: Secondary | ICD-10-CM

## 2022-03-11 DIAGNOSIS — Z9079 Acquired absence of other genital organ(s): Secondary | ICD-10-CM

## 2022-03-11 LAB — RETICULOCYTES
Immature Retic Fract: 12.6 % (ref 2.3–15.9)
RBC.: 4.33 MIL/uL (ref 4.22–5.81)
Retic Count, Absolute: 47.6 10*3/uL (ref 19.0–186.0)
Retic Ct Pct: 1.1 % (ref 0.4–3.1)

## 2022-03-11 LAB — CBC WITH DIFFERENTIAL/PLATELET
Abs Immature Granulocytes: 0.01 10*3/uL (ref 0.00–0.07)
Basophils Absolute: 0 10*3/uL (ref 0.0–0.1)
Basophils Relative: 1 %
Eosinophils Absolute: 0.7 10*3/uL — ABNORMAL HIGH (ref 0.0–0.5)
Eosinophils Relative: 13 %
HCT: 40.1 % (ref 39.0–52.0)
Hemoglobin: 12.5 g/dL — ABNORMAL LOW (ref 13.0–17.0)
Immature Granulocytes: 0 %
Lymphocytes Relative: 26 %
Lymphs Abs: 1.4 10*3/uL (ref 0.7–4.0)
MCH: 28.3 pg (ref 26.0–34.0)
MCHC: 31.2 g/dL (ref 30.0–36.0)
MCV: 90.7 fL (ref 80.0–100.0)
Monocytes Absolute: 0.6 10*3/uL (ref 0.1–1.0)
Monocytes Relative: 11 %
Neutro Abs: 2.6 10*3/uL (ref 1.7–7.7)
Neutrophils Relative %: 49 %
Platelets: 254 10*3/uL (ref 150–400)
RBC: 4.42 MIL/uL (ref 4.22–5.81)
RDW: 14 % (ref 11.5–15.5)
WBC: 5.3 10*3/uL (ref 4.0–10.5)
nRBC: 0 % (ref 0.0–0.2)

## 2022-03-11 LAB — COMPREHENSIVE METABOLIC PANEL
ALT: 10 U/L (ref 0–44)
AST: 17 U/L (ref 15–41)
Albumin: 4.1 g/dL (ref 3.5–5.0)
Alkaline Phosphatase: 48 U/L (ref 38–126)
Anion gap: 10 (ref 5–15)
BUN: 27 mg/dL — ABNORMAL HIGH (ref 8–23)
CO2: 24 mmol/L (ref 22–32)
Calcium: 9.1 mg/dL (ref 8.9–10.3)
Chloride: 105 mmol/L (ref 98–111)
Creatinine, Ser: 1.24 mg/dL (ref 0.61–1.24)
GFR, Estimated: 59 mL/min — ABNORMAL LOW (ref >60)
Glucose, Bld: 95 mg/dL (ref 70–99)
Potassium: 4.5 mmol/L (ref 3.5–5.1)
Sodium: 139 mmol/L (ref 135–145)
Total Bilirubin: 0.6 mg/dL (ref 0.3–1.2)
Total Protein: 6.9 g/dL (ref 6.5–8.1)

## 2022-03-11 LAB — PSA: Prostatic Specific Antigen: 0.09 ng/mL (ref 0.00–4.00)

## 2022-03-11 LAB — DIRECT ANTIGLOBULIN TEST (NOT AT ARMC)
DAT, IgG: NEGATIVE
DAT, complement: NEGATIVE

## 2022-03-11 LAB — VITAMIN B12: Vitamin B-12: 170 pg/mL — ABNORMAL LOW (ref 180–914)

## 2022-03-11 LAB — FOLATE: Folate: 16.9 ng/mL (ref >5.9)

## 2022-03-11 LAB — FERRITIN: Ferritin: 21 ng/mL — ABNORMAL LOW (ref 24–336)

## 2022-03-11 NOTE — Progress Notes (Unsigned)
[name] [facility] Follow up  Visit:  Patient Care Team: Street, [name], MD as PCP - General (Family Medicine)  CHIEF COMPLAINTS/PURPOSE OF CONSULTATION:  Oncology History  Prostate cancer [facility])  08/2014 Tumor Marker   PSA 10.1   11/01/2014 Tumor Marker   PSA 12.22   12/20/2014 Procedure   Prostate biopsy. 10/12 cores (+) for prostate adenoca. Gleason 9 (5+4) disease noted in 4 cores. PNI+. Gleason 9 (4+5) noted in 2 cores. PNI+.  Gleason 8 (5+3) in 1 core and Gleason 8 (4+4) in 1 core. Gleason 7 (4+3) in 2 cores. PNI+.    12/20/2014 Initial Diagnosis   Prostate cancer ([place])   01/16/2015 Imaging   Bone scan: 2 punctate areas of increased activity noted about left face/skull. 1 punctate area of increased activity noted about right occipital skull. Maxillofacial and head CT with attention to bone windows suggested for further eval.   08/01/2020 Cancer Staging   Staging form: Prostate, AJCC 7th Edition - Clinical: Stage IV (T3b, N1, M0, Gleason 8-10) - Signed by Jesse Portela, MD on 08/01/2020     HISTORY OF PRESENTING ILLNESS: Jesse Mann 82 y.o. male is here because of prostate cancer Medical history notable for ischemic cardiomyopathy, cardiac arrhythmia requiring AICD placement, sleep apnea, MI  December 20, 2014 diagnosed with Gleason 9 (5+4 disease with perineural invasion) January 26, 2015: Bone scan showed 2 punctate areas of increased activity about the left face/skull and a punctate area of increased activity about the right occipital bone March 03, 2015 radical prostatectomy.  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  He declined radiation October 29, 2017 with last dose of Lupron   September 07, 2021: WBC 6.9 hemoglobin 12.3  platelet count 184; 63 segs 18 lymphs 10 monos 8 eos creatinine 1.3 PSA 0.1  March 11 2022:  Scheduled follow up for management of prostate cancer.  About 5 weeks ago underwent spine surgery.  Last week AICD went off; he was subsequently admitted for evaluation which included a cardiac cath.    Review of Systems  Constitutional:  Negative for chills, fatigue and fever.       Has lost 5 lbs  HENT:   Negative for mouth sores, nosebleeds, sore throat, tinnitus and trouble swallowing.   Eyes:  Negative for eye problems and icterus.       Vision changes:  None  Respiratory:  Negative for chest tightness, cough, hemoptysis, shortness of breath and wheezing.        PND:  none Orthopnea:  none DOE:    Cardiovascular:  Negative for chest pain, leg swelling and palpitations.       PND:  none Orthopnea:  none  Gastrointestinal:  Negative for abdominal pain, constipation, diarrhea, nausea and vomiting.       Occasional hematochezia attributed to hemorrhoids   Endocrine: Negative for hot flashes.       Cold intolerance:  none Heat intolerance:  none  Genitourinary:  Negative for bladder incontinence, difficulty urinating, dysuria, frequency, hematuria and nocturia.   Musculoskeletal:  Positive for arthralgias, back pain and gait problem. Negative for myalgias.  Skin:  Negative for itching, rash and wound.  Neurological:  Positive for extremity weakness and gait problem. Negative for dizziness, headaches, light-headedness, numbness, seizures and speech difficulty.  Ambulates with a cane  Hematological:  Negative for adenopathy. Does not bruise/bleed easily.  Psychiatric/Behavioral:  Negative for sleep disturbance and suicidal ideas. The patient is not nervous/anxious.     MEDICAL HISTORY: Past Medical History:  Diagnosis Date   AICD (automatic cardioverter/defibrillator) present    Arthritis    fingers- right (2-4 fingers with ROM issues).knee left   Bifascicular block    Cardiac arrest  ([place]) [facility]   Cardiac arrhythmia    Dr. Revankar,cardiology [phone] [facility])   Cardiac murmur    Cardiomyopathy [facility])    Ischemic   CHF (congestive heart failure) ([place])    Coronary artery disease    Flail chest    GERD (gastroesophageal reflux disease)    Hemorrhoids    History of tracheostomy    History of ventricular fibrillation 07/22/2016   Hypercholesteremia    Hypertension    Left anterior fascicular block (LAFB)    Myocardial infarction ([place])    7'14[facility]- "feeling strange" eavaluated, heart cath done with stents x2 placed   Pneumonia    history of   Presence of permanent cardiac pacemaker    Prostate cancer ([place])    PVC (premature ventricular contraction)    RBBB    Recurrent dry cough    intermittent daily -occ. production   Sleep apnea     SURGICAL HISTORY: Past Surgical History:  Procedure Laterality Date   BACK SURGERY     With Rods and screws   CARDIAC CATHETERIZATION     '14- stents placed x2 only.   CARDIAC CATHETERIZATION     COLONOSCOPY  2011   CORONARY ANGIOPLASTY     CYSTOSCOPY W/ URETERAL STENT PLACEMENT Bilateral 04/16/2017   Procedure: CYSTOSCOPY WITH CATHETER  REPLACEMENT FIREFLY;  Surgeon: Ardis Hughs, MD;  Location: [facility];  Service: Urology;  Laterality: Bilateral;   HAND SURGERY     HERNIA REPAIR  1997 and 1995   inguinal (x2 on one side)   ICD IMPLANT     INGUINAL HERNIA REPAIR     INSERT / REPLACE / REMOVE PACEMAKER     LYMPH NODE DISSECTION Bilateral 03/03/2015   Procedure: BILATERAL PELVIC LYMPH NODE DISSECTION;  Surgeon: Ardis Hughs, MD;  Location: [facility];  Service: Urology;  Laterality: Bilateral;   PROSTATE BIOPSY     REPAIR EXTENSOR TENDON HAND Right    (3-4 fingers) '65- x3-4 times"trauma injury   ROBOT ASSISTED LAPAROSCOPIC RADICAL PROSTATECTOMY Bilateral 03/03/2015   Procedure: XI ROBOTIC ASSISTED LAPAROSCOPIC  RADICAL PROSTATECTOMY;  Surgeon: Ardis Hughs, MD;  Location: [facility];  Service: Urology;  Laterality: Bilateral;   Surgery to correct flail chest after CPR     TONSILLECTOMY     TRACHEOSTOMY      SOCIAL HISTORY: Social History   Socioeconomic History   Marital status: Married    Spouse name: Not on file   Number of children: 3   Years of education: Not on file   Highest education level: Not on file  Occupational History   Occupation: Retired  Tobacco Use   Smoking status: Former    Packs/day: 1.00    Years: 10.00    Total pack years: 10.00    Types: Cigarettes    Quit date: 01/07/1966    Years since quitting: 56.2   Smokeless tobacco: Never  Vaping Use   Vaping Use: Never used  Substance and Sexual Activity   Alcohol use: No  Alcohol/week: 0.0 standard drinks of alcohol   Drug use: No   Sexual activity: Never  Other Topics Concern   Not on file  Social History Narrative   Not on file   Social Determinants of Health   Financial Resource Strain: Not on file  Food Insecurity: Not on file  Transportation Needs: Not on file  Physical Activity: Not on file  Stress: Not on file  Social Connections: Not on file  Intimate Partner Violence: Not on file    FAMILY HISTORY Family History  Problem Relation Age of Onset   Heart failure Mother    Cancer Father        colon   Cancer Paternal Grandmother        type unknown    ALLERGIES:  has No Known Allergies.  MEDICATIONS:  Current Outpatient Medications  Medication Sig Dispense Refill   acetaminophen (TYLENOL) 500 MG tablet Take 500 mg by mouth every 6 (six) hours as needed (for pain.).     amiodarone (PACERONE) 200 MG tablet Take 200 mg by mouth daily. 0.5 tab (100 mg total) po daily     aspirin 81 MG tablet Take 81 mg by mouth daily.     carvedilol (COREG) 12.5 MG tablet Take 6.25 mg by mouth 2 (two) times daily with a meal.      cyanocobalamin (VITAMIN B12) 1000 MCG tablet Take 1 tablet by mouth daily.      famotidine (PEPCID) 40 MG tablet Take 40 mg at bedtime by mouth.     magnesium oxide (MAG-OX) 400 (240 Mg) MG tablet Take 1 tablet by mouth daily.     nitroGLYCERIN (NITROSTAT) 0.4 MG SL tablet Place 0.4 mg under the tongue every 5 (five) minutes as needed for chest pain.      ranolazine (RANEXA) 500 MG 12 hr tablet Take 500 mg by mouth 2 (two) times daily.     rosuvastatin (CRESTOR) 10 MG tablet Take 1 tablet by mouth daily.     No current facility-administered medications for this visit.    PHYSICAL EXAMINATION:  ECOG PERFORMANCE STATUS: 1 - Symptomatic but completely ambulatory   Vitals:   03/11/22 0841  BP: (!) 124/93  Pulse: 75  Resp: 18  Temp: 98.7 F (37.1 C)  SpO2: 99%    Filed Weights   03/11/22 0841  Weight: 155 lb 4.8 oz (70.4 kg)     Physical Exam Vitals and nursing note reviewed.  Constitutional:      Appearance: Normal appearance. He is not diaphoretic.     Comments: Here alone  HENT:     Head: Normocephalic and atraumatic.     Right Ear: External ear normal.     Left Ear: External ear normal.     Nose: Nose normal.  Eyes:     Conjunctiva/sclera: Conjunctivae normal.     Pupils: Pupils are equal, round, and reactive to light.  Cardiovascular:     Rate and Rhythm: Regular rhythm. Tachycardia present.     Heart sounds: Normal heart sounds.     No friction rub. No gallop.  Pulmonary:     Effort: Pulmonary effort is normal. No respiratory distress.     Breath sounds: Normal breath sounds. No stridor. No wheezing or rhonchi.  Abdominal:     Palpations: Abdomen is soft.     Tenderness: There is no abdominal tenderness. There is no guarding or rebound.  Musculoskeletal:        General: No deformity.     Cervical  back: Normal range of motion and neck supple. No rigidity or tenderness.     Right lower leg: No edema.     Left lower leg: No edema.     Comments: Deformity of hands.  Ambulates with a cane  Lymphadenopathy:     Head:     Right side of  head: No submental, submandibular, tonsillar, preauricular, posterior auricular or occipital adenopathy.     Left side of head: No submental, submandibular, tonsillar, preauricular, posterior auricular or occipital adenopathy.     Cervical: No cervical adenopathy.     Right cervical: No superficial, deep or posterior cervical adenopathy.    Left cervical: No superficial, deep or posterior cervical adenopathy.     Upper Body:     Right upper body: No supraclavicular or axillary adenopathy.     Left upper body: No supraclavicular or axillary adenopathy.     Lower Body: No right inguinal adenopathy. No left inguinal adenopathy.  Skin:    Coloration: Skin is not jaundiced or pale.     Findings: No bruising.  Neurological:     General: No focal deficit present.     Mental Status: He is alert and oriented to person, place, and time.     Cranial Nerves: No cranial nerve deficit.     Gait: Gait abnormal.  Psychiatric:        Mood and Affect: Mood normal.        Behavior: Behavior normal.        Thought Content: Thought content normal.        Judgment: Judgment normal.     LABORATORY DATA: I have personally reviewed the data as listed:  Office Visit on 03/11/2022  Component Date Value Ref Range Status   WBC 03/11/2022 5.3  4.0 - 10.5 K/uL Final   RBC 03/11/2022 4.42  4.22 - 5.81 MIL/uL Final   Hemoglobin 03/11/2022 12.5 (L)  13.0 - 17.0 g/dL Final   HCT 03/11/2022 40.1  39.0 - 52.0 % Final   MCV 03/11/2022 90.7  80.0 - 100.0 fL Final   MCH 03/11/2022 28.3  26.0 - 34.0 pg Final   MCHC 03/11/2022 31.2  30.0 - 36.0 g/dL Final   RDW 03/11/2022 14.0  11.5 - 15.5 % Final   Platelets 03/11/2022 254  150 - 400 K/uL Final   nRBC 03/11/2022 0.0  0.0 - 0.2 % Final   Neutrophils Relative % 03/11/2022 49  % Final   Neutro Abs 03/11/2022 2.6  1.7 - 7.7 K/uL Final   Lymphocytes Relative 03/11/2022 26  % Final   Lymphs Abs 03/11/2022 1.4  0.7 - 4.0 K/uL Final   Monocytes Relative 03/11/2022 11  %  Final   Monocytes Absolute 03/11/2022 0.6  0.1 - 1.0 K/uL Final   Eosinophils Relative 03/11/2022 13  % Final   Eosinophils Absolute 03/11/2022 0.7 (H)  0.0 - 0.5 K/uL Final   Basophils Relative 03/11/2022 1  % Final   Basophils Absolute 03/11/2022 0.0  0.0 - 0.1 K/uL Final   Immature Granulocytes 03/11/2022 0  % Final   Abs Immature Granulocytes 03/11/2022 0.01  0.00 - 0.07 K/uL Final   Performed at [facility], [place] [address] [place], [place] [address]   Sodium 03/11/2022 139  135 - 145 mmol/L Final   Potassium 03/11/2022 4.5  3.5 - 5.1 mmol/L Final   Chloride 03/11/2022 105  98 - 111 mmol/L Final   CO2 03/11/2022 24  22 - 32 mmol/L Final  Glucose, Bld 03/11/2022 95  70 - 99 mg/dL Final   Glucose reference range applies only to samples taken after fasting for at least 8 hours.   BUN 03/11/2022 27 (H)  8 - 23 mg/dL Final   Creatinine, Ser 03/11/2022 1.24  0.61 - 1.24 mg/dL Final   Calcium 03/11/2022 9.1  8.9 - 10.3 mg/dL Final   Total Protein 03/11/2022 6.9  6.5 - 8.1 g/dL Final   Albumin 03/11/2022 4.1  3.5 - 5.0 g/dL Final   AST 03/11/2022 17  15 - 41 U/L Final   ALT 03/11/2022 10  0 - 44 U/L Final   Alkaline Phosphatase 03/11/2022 48  38 - 126 U/L Final   Total Bilirubin 03/11/2022 0.6  0.3 - 1.2 mg/dL Final   GFR, Estimated 03/11/2022 59 (L)  >60 mL/min Final   Comment: (NOTE) Calculated using the CKD-EPI Creatinine Equation (2021)    Anion gap 03/11/2022 10  5 - 15 Final   Performed at [facility], [place] [address] [place], [place] [address]  Appointment on 03/11/2022  Component Date Value Ref Range Status   Prostatic Specific Antigen 03/11/2022 0.09  0.00 - 4.00 ng/mL Final   Comment: (NOTE) While PSA levels of <=4.00 ng/ml are reported as reference range, some men with levels below 4.00 ng/ml can have prostate cancer and many men with PSA above 4.00 ng/ml do not have prostate cancer.  Other tests such as free PSA, age specific  reference ranges, PSA velocity and PSA doubling time may be helpful especially in men less than 64 years old. Performed at [facility], [place] [address] [place], [place] [address]    DAT, complement 03/11/2022 NEG   Final   DAT, IgG 03/11/2022    Final                   Value:NEG Performed at [facility], [place] [address], [place], [place] [address]    Retic Ct Pct 03/11/2022 1.1  0.4 - 3.1 % Final   RBC. 03/11/2022 4.33  4.22 - 5.81 MIL/uL Final   Retic Count, Absolute 03/11/2022 47.6  19.0 - 186.0 K/uL Final   Immature Retic Fract 03/11/2022 12.6  2.3 - 15.9 % Final   Performed at [facility], [place] [address], [place], [place] [address]   Vitamin B-12 03/11/2022 170 (L)  180 - 914 pg/mL Final   Comment: (NOTE) This assay is not validated for testing neonatal or myeloproliferative syndrome specimens for Vitamin B12 levels. Performed at [facility], [place] [address], [place], [place] [address]    Haptoglobin 03/11/2022 163  34 - 355 mg/dL Final   Comment: (NOTE) Performed At: [facility] [place], [place] [id number] Rush Farmer MD [id number]    Folate 03/11/2022 16.9  >5.9 ng/mL Final   Performed at [facility], [place] [address], [place], [place] [address]   Ferritin 03/11/2022 21 (L)  24 - 336 ng/mL Final   Performed at [facility], [place] [address], [place], [place] [address]    RADIOGRAPHIC STUDIES: I have personally reviewed the radiological images as listed and agree with the findings in the report  No results found.  ASSESSMENT/PLAN  82 y.o. male is here because of prostate cancer.  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  Declined XRT September 07 2021:  PSA 0.1 March 11 2022:  Will check PSA today  Anemia:  Will obtain CBC with diff, CMP, Ferritin, B12, folate, retic count,  DAT, Haptoglobin.   Cancer Staging  Prostate cancer [facility]) Staging form: Prostate, AJCC 7th Edition - Clinical: Stage IV (T3b, N1, M0, Gleason 8-10) - Signed by Jesse Portela, MD on 08/01/2020    No problem-specific Assessment & Plan notes found for this encounter.    Orders Placed This Encounter  Procedures   Ferritin    Standing Status:   Future    Number of Occurrences:   1    Standing Expiration Date:   03/11/2023   Folate    Standing Status:   Future    Number of Occurrences:   1    Standing Expiration Date:   03/11/2023   Haptoglobin    Standing Status:   Future    Number of Occurrences:   1    Standing Expiration Date:   03/11/2023   Vitamin B12    Standing Status:   Future    Number of Occurrences:   1    Standing Expiration Date:   03/11/2023   Reticulocytes    Standing Status:   Future    Number of Occurrences:   1    Standing Expiration Date:   03/11/2023   PSA    Standing Status:   Future    Number of Occurrences:   1    Standing Expiration Date:   03/11/2023   CBC with Differential/Platelet   Comprehensive metabolic panel   Direct antiglobulin test (not at [facility])    Standing Status:   Future    Number of Occurrences:   1    Standing Expiration Date:   03/11/2023    32  minutes was spent in patient care.  This included time spent preparing to see the patient (e.g., review of tests), obtaining and/or reviewing separately obtained history, counseling and educating the patient/family/caregiver, ordering medications, tests, or procedures; documenting clinical information in the electronic or other health record, independently interpreting results and communicating results to the  patient/family/caregiver as well as coordination of care.       All questions were answered. The patient knows to call the clinic with any problems, questions or concerns.  This note was electronically signed.    Barbee Cough, MD  03/13/2022 12:34 PM

## 2022-03-11 NOTE — Telephone Encounter (Signed)
03/11/22 Next appt scheduled and confirmed with patient

## 2022-03-12 LAB — HAPTOGLOBIN: Haptoglobin: 163 mg/dL (ref 34–355)

## 2022-03-13 ENCOUNTER — Encounter: Payer: Self-pay | Admitting: Oncology

## 2022-03-14 ENCOUNTER — Inpatient Hospital Stay: Payer: Medicare HMO

## 2022-03-14 VITALS — BP 120/74 | HR 79 | Temp 98.4°F | Resp 18 | Ht 66.0 in | Wt 156.2 lb

## 2022-03-14 DIAGNOSIS — C61 Malignant neoplasm of prostate: Secondary | ICD-10-CM

## 2022-03-14 DIAGNOSIS — D519 Vitamin B12 deficiency anemia, unspecified: Secondary | ICD-10-CM

## 2022-03-14 DIAGNOSIS — E538 Deficiency of other specified B group vitamins: Secondary | ICD-10-CM | POA: Diagnosis not present

## 2022-03-14 MED ORDER — CYANOCOBALAMIN 1000 MCG/ML IJ SOLN
1000.0000 ug | Freq: Once | INTRAMUSCULAR | Status: AC
  Administered 2022-03-14: 1000 ug via INTRAMUSCULAR
  Filled 2022-03-14: qty 1

## 2022-03-14 NOTE — Patient Instructions (Signed)

## 2022-03-15 ENCOUNTER — Encounter: Payer: Self-pay | Admitting: Oncology

## 2022-03-15 ENCOUNTER — Inpatient Hospital Stay: Payer: Medicare HMO

## 2022-03-15 VITALS — BP 120/72 | HR 86 | Temp 97.8°F | Resp 14 | Ht 66.0 in | Wt 155.1 lb

## 2022-03-15 DIAGNOSIS — C61 Malignant neoplasm of prostate: Secondary | ICD-10-CM

## 2022-03-15 DIAGNOSIS — E538 Deficiency of other specified B group vitamins: Secondary | ICD-10-CM | POA: Diagnosis not present

## 2022-03-15 DIAGNOSIS — D519 Vitamin B12 deficiency anemia, unspecified: Secondary | ICD-10-CM

## 2022-03-15 MED ORDER — CYANOCOBALAMIN 1000 MCG/ML IJ SOLN
1000.0000 ug | Freq: Once | INTRAMUSCULAR | Status: AC
  Administered 2022-03-15: 1000 ug via INTRAMUSCULAR
  Filled 2022-03-15: qty 1

## 2022-03-15 NOTE — Addendum Note (Signed)
Addended by: Juanetta Beets on: 03/15/2022 01:12 PM   Modules accepted: Orders

## 2022-03-18 ENCOUNTER — Inpatient Hospital Stay: Payer: Medicare HMO

## 2022-03-18 VITALS — BP 135/82 | HR 80 | Temp 97.9°F | Resp 16

## 2022-03-18 DIAGNOSIS — D519 Vitamin B12 deficiency anemia, unspecified: Secondary | ICD-10-CM

## 2022-03-18 DIAGNOSIS — E538 Deficiency of other specified B group vitamins: Secondary | ICD-10-CM | POA: Diagnosis not present

## 2022-03-18 DIAGNOSIS — C61 Malignant neoplasm of prostate: Secondary | ICD-10-CM | POA: Diagnosis not present

## 2022-03-18 MED ORDER — CYANOCOBALAMIN 1000 MCG/ML IJ SOLN
1000.0000 ug | Freq: Once | INTRAMUSCULAR | Status: AC
  Administered 2022-03-18: 1000 ug via INTRAMUSCULAR
  Filled 2022-03-18: qty 1

## 2022-03-18 NOTE — Patient Instructions (Signed)

## 2022-03-19 ENCOUNTER — Ambulatory Visit: Payer: Medicare HMO | Admitting: Oncology

## 2022-03-19 ENCOUNTER — Inpatient Hospital Stay: Payer: Medicare HMO

## 2022-03-19 VITALS — BP 142/92 | HR 89 | Temp 98.1°F | Resp 16 | Wt 154.0 lb

## 2022-03-19 DIAGNOSIS — D519 Vitamin B12 deficiency anemia, unspecified: Secondary | ICD-10-CM

## 2022-03-19 DIAGNOSIS — C61 Malignant neoplasm of prostate: Secondary | ICD-10-CM | POA: Diagnosis not present

## 2022-03-19 DIAGNOSIS — E538 Deficiency of other specified B group vitamins: Secondary | ICD-10-CM | POA: Diagnosis not present

## 2022-03-19 MED ORDER — CYANOCOBALAMIN 1000 MCG/ML IJ SOLN
1000.0000 ug | Freq: Once | INTRAMUSCULAR | Status: AC
  Administered 2022-03-19: 1000 ug via INTRAMUSCULAR
  Filled 2022-03-19: qty 1

## 2022-03-19 NOTE — Patient Instructions (Signed)

## 2022-03-20 ENCOUNTER — Inpatient Hospital Stay: Payer: Medicare HMO

## 2022-03-20 VITALS — BP 117/86 | HR 90 | Temp 97.6°F | Resp 16

## 2022-03-20 DIAGNOSIS — D519 Vitamin B12 deficiency anemia, unspecified: Secondary | ICD-10-CM

## 2022-03-20 DIAGNOSIS — E538 Deficiency of other specified B group vitamins: Secondary | ICD-10-CM | POA: Diagnosis not present

## 2022-03-20 DIAGNOSIS — C61 Malignant neoplasm of prostate: Secondary | ICD-10-CM

## 2022-03-20 MED ORDER — CYANOCOBALAMIN 1000 MCG/ML IJ SOLN
1000.0000 ug | Freq: Once | INTRAMUSCULAR | Status: AC
  Administered 2022-03-20: 1000 ug via INTRAMUSCULAR
  Filled 2022-03-20: qty 1

## 2022-03-20 NOTE — Patient Instructions (Signed)

## 2022-03-21 ENCOUNTER — Inpatient Hospital Stay: Payer: Medicare HMO

## 2022-03-21 VITALS — BP 112/68 | HR 83 | Temp 97.8°F | Resp 16

## 2022-03-21 DIAGNOSIS — D519 Vitamin B12 deficiency anemia, unspecified: Secondary | ICD-10-CM

## 2022-03-21 DIAGNOSIS — C61 Malignant neoplasm of prostate: Secondary | ICD-10-CM | POA: Diagnosis not present

## 2022-03-21 DIAGNOSIS — E538 Deficiency of other specified B group vitamins: Secondary | ICD-10-CM | POA: Diagnosis not present

## 2022-03-21 MED ORDER — CYANOCOBALAMIN 1000 MCG/ML IJ SOLN
1000.0000 ug | Freq: Once | INTRAMUSCULAR | Status: AC
  Administered 2022-03-21: 1000 ug via INTRAMUSCULAR
  Filled 2022-03-21: qty 1

## 2022-03-21 NOTE — Patient Instructions (Signed)

## 2022-03-22 ENCOUNTER — Inpatient Hospital Stay: Payer: Medicare HMO

## 2022-03-22 VITALS — BP 104/63 | HR 83 | Temp 98.4°F | Resp 16 | Ht 66.0 in | Wt 155.1 lb

## 2022-03-22 DIAGNOSIS — D519 Vitamin B12 deficiency anemia, unspecified: Secondary | ICD-10-CM

## 2022-03-22 DIAGNOSIS — C61 Malignant neoplasm of prostate: Secondary | ICD-10-CM | POA: Diagnosis not present

## 2022-03-22 DIAGNOSIS — E538 Deficiency of other specified B group vitamins: Secondary | ICD-10-CM | POA: Diagnosis not present

## 2022-03-22 MED ORDER — CYANOCOBALAMIN 1000 MCG/ML IJ SOLN
1000.0000 ug | Freq: Once | INTRAMUSCULAR | Status: AC
  Administered 2022-03-22: 1000 ug via INTRAMUSCULAR
  Filled 2022-03-22: qty 1

## 2022-03-26 ENCOUNTER — Inpatient Hospital Stay: Payer: Medicare HMO | Admitting: Oncology

## 2022-03-26 VITALS — BP 121/86 | HR 86 | Temp 97.5°F | Resp 16 | Ht 66.0 in | Wt 156.8 lb

## 2022-03-26 DIAGNOSIS — E538 Deficiency of other specified B group vitamins: Secondary | ICD-10-CM

## 2022-03-26 DIAGNOSIS — C61 Malignant neoplasm of prostate: Secondary | ICD-10-CM | POA: Diagnosis not present

## 2022-03-26 DIAGNOSIS — D649 Anemia, unspecified: Secondary | ICD-10-CM

## 2022-03-26 NOTE — Patient Instructions (Signed)
Please begin Vitamin B12 1000 micrograms under the tongue daily This is available over the counter

## 2022-03-26 NOTE — Addendum Note (Signed)
Addended by: Juanetta Beets on: 03/26/2022 04:33 PM   Modules accepted: Orders

## 2022-03-26 NOTE — Progress Notes (Unsigned)
[place] Cancer Follow up  Visit:  Patient Care Team: Street, [name], MD as PCP - General (Family Medicine)  CHIEF COMPLAINTS/PURPOSE OF CONSULTATION:  Oncology History  Prostate cancer [facility]  08/2014 Tumor Marker   PSA 10.1   11/01/2014 Tumor Marker   PSA 12.22   12/20/2014 Procedure   Prostate biopsy. 10/12 cores (+) for prostate adenoca. Gleason 9 (5+4) disease noted in 4 cores. PNI+. Gleason 9 (4+5) noted in 2 cores. PNI+.  Gleason 8 (5+3) in 1 core and Gleason 8 (4+4) in 1 core. Gleason 7 (4+3) in 2 cores. PNI+.    12/20/2014 Initial Diagnosis   Prostate cancer ([place])   01/16/2015 Imaging   Bone scan: 2 punctate areas of increased activity noted about left face/skull. 1 punctate area of increased activity noted about right occipital skull. Maxillofacial and head CT with attention to bone windows suggested for further eval.   08/01/2020 Cancer Staging   Staging form: Prostate, AJCC 7th Edition - Clinical: Stage IV (T3b, N1, M0, Gleason 8-10) - Signed by [name], MD on 08/01/2020     HISTORY OF PRESENTING ILLNESS: Jesse Mann 82 y.o. male is here because of prostate cancer Medical history notable for ischemic cardiomyopathy, cardiac arrhythmia requiring AICD placement, sleep apnea, MI  December 20, 2014 diagnosed with Gleason 9 (5+4 disease with perineural invasion) January 26, 2015: Bone scan showed 2 punctate areas of increased activity about the left face/skull and a punctate area of increased activity about the right occipital bone March 03, 2015 radical prostatectomy.  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  He declined radiation October 29, 2017 with last dose of Lupron   September 07, 2021: WBC 6.9 hemoglobin 12.3  platelet count 184; 63 segs 18 lymphs 10 monos 8 eos creatinine 1.3 PSA 0.1  March 11 2022:  About 5 weeks ago underwent spine surgery.  Last week AICD went off; he was subsequently admitted for evaluation which included a cardiac cath.    WBC 5.3 hemoglobin 12.5 platelet count 254; 49 segs 26 lymphs 11 monos 13 eos 1 basophil.  Reticulocytes 1.1%. Coombs test negative haptoglobin 163 Ferritin 21 folate 16.9 B12 170 PSA 0.09 CMP notable for creatinine 1.24 BUN 27  March 06 2022:  Cardiac ECHO- LV is moderately dilated. Mild left ventricular hypertrophy. LV ejection fraction = 35-40%. There is mild to moderate global hypokinesis of the left ventricle.  Left heart cath- Coronary Findings Diagnostic Dominance: Right  Left Main: Dist LM to Ost LAD lesion is 20% stenosed. Left Anterior Descending: Prox LAD to Mid LAD lesion is 10% stenosed. First Diagonal Branch: 1st Diag lesion is 20% stenosed. Left Circumflex: Ost Cx to Prox Cx lesion is 20% stenosed. Prox Cx to Mid Cx lesion is 30% stenosed. Right Coronary Artery: Prox RCA to Mid RCA lesion is 100% stenosed. The lesion is chronically occluded. The lesion was not previously treated.  Intervention- No interventions have been documented.   March 7 through March 22, 2022:  Received 1000 mcg of vitamin B12 x 7 doses   March 26 2022:  Scheduled follow up for management of prostate cancer.  Patient on ASA 81 mg daily.  Last colonoscopy was in 2011 Discussed referral to GI for consideration of endoscopy.  He is hesitant to consider it because  of age.  Will continue to follow Hgb, ferritin levels and if downward trend then rediscuss GI consult.  Will switch from B12 sq to sublingual.  Had not previously been taking B12 regularly.  Fatigued since carvedilol dose increased by cardiology.  (I encouraged him to discuss this with cardiology)  Review of Systems  Constitutional:  Negative for chills, fatigue and fever.       Has lost 5 lbs  HENT:    Negative for mouth sores, nosebleeds, sore throat, tinnitus and trouble swallowing.   Eyes:  Negative for eye problems and icterus.       Vision changes:  None  Respiratory:  Negative for chest tightness, cough, hemoptysis, shortness of breath and wheezing.        PND:  none Orthopnea:  none DOE:    Cardiovascular:  Negative for chest pain, leg swelling and palpitations.       PND:  none Orthopnea:  none  Gastrointestinal:  Negative for abdominal pain, constipation, diarrhea, nausea and vomiting.       Occasional hematochezia attributed to hemorrhoids   Endocrine: Negative for hot flashes.       Cold intolerance:  none Heat intolerance:  none  Genitourinary:  Negative for bladder incontinence, difficulty urinating, dysuria, frequency, hematuria and nocturia.   Musculoskeletal:  Positive for arthralgias, back pain and gait problem. Negative for myalgias.  Skin:  Negative for itching, rash and wound.  Neurological:  Positive for extremity weakness and gait problem. Negative for dizziness, headaches, light-headedness, numbness, seizures and speech difficulty.       Ambulates with a cane  Hematological:  Negative for adenopathy. Does not bruise/bleed easily.  Psychiatric/Behavioral:  Negative for sleep disturbance and suicidal ideas. The patient is not nervous/anxious.     MEDICAL HISTORY: Past Medical History:  Diagnosis Date   AICD (automatic cardioverter/defibrillator) present    Arthritis    fingers- right (2-4 fingers with ROM issues).knee left   Bifascicular block    Cardiac arrest ([place]) [facility]2016   [facility]   Cardiac arrhythmia    Dr. Revankar,cardiology [phone] [facility])   Cardiac murmur    Cardiomyopathy [facility])    Ischemic   CHF (congestive heart failure) ([place])    Coronary artery disease    Flail chest    GERD (gastroesophageal reflux disease)    Hemorrhoids    History of tracheostomy    History  of ventricular fibrillation 07/22/2016   Hypercholesteremia    Hypertension    Left anterior fascicular block (LAFB)    Myocardial infarction ([place])    7'14- [facility]- "feeling strange" eavaluated, heart cath done with stents x2 placed   Pneumonia    history of   Presence of permanent cardiac pacemaker    Prostate cancer ([place])    PVC (premature ventricular contraction)    RBBB    Recurrent dry cough    intermittent daily -occ. production   Sleep apnea     SURGICAL HISTORY: Past Surgical History:  Procedure Laterality Date   BACK SURGERY     With Rods and screws   CARDIAC CATHETERIZATION     '14- stents placed x2 only.   CARDIAC CATHETERIZATION     COLONOSCOPY  2011   CORONARY ANGIOPLASTY     CYSTOSCOPY W/ URETERAL STENT PLACEMENT Bilateral 04/16/2017   Procedure: CYSTOSCOPY WITH CATHETER  REPLACEMENT FIREFLY;  Surgeon: Ardis Hughs, MD;  Location: [facility];  Service: Urology;  Laterality: Bilateral;  HAND SURGERY     HERNIA REPAIR  1997 and 1995   inguinal (x2 on one side)   ICD IMPLANT     INGUINAL HERNIA REPAIR     INSERT / REPLACE / REMOVE PACEMAKER     LYMPH NODE DISSECTION Bilateral 03/03/2015   Procedure: BILATERAL PELVIC LYMPH NODE DISSECTION;  Surgeon: Ardis Hughs, MD;  Location: [facility];  Service: Urology;  Laterality: Bilateral;   PROSTATE BIOPSY     REPAIR EXTENSOR TENDON HAND Right    (3-4 fingers) '65- x3-4 times"trauma injury   ROBOT ASSISTED LAPAROSCOPIC RADICAL PROSTATECTOMY Bilateral 03/03/2015   Procedure: XI ROBOTIC ASSISTED LAPAROSCOPIC RADICAL PROSTATECTOMY;  Surgeon: Ardis Hughs, MD;  Location: [facility];  Service: Urology;  Laterality: Bilateral;   Surgery to correct flail chest after CPR     TONSILLECTOMY     TRACHEOSTOMY      SOCIAL HISTORY: Social History   Socioeconomic History   Marital status: Married    Spouse name: Not on file   Number of children: 3   Years of education: Not on file   Highest education  level: Not on file  Occupational History   Occupation: Retired  Tobacco Use   Smoking status: Former    Packs/day: 1.00    Years: 10.00    Additional pack years: 0.00    Total pack years: 10.00    Types: Cigarettes    Quit date: 01/07/1966    Years since quitting: 56.2   Smokeless tobacco: Never  Vaping Use   Vaping Use: Never used  Substance and Sexual Activity   Alcohol use: No    Alcohol/week: 0.0 standard drinks of alcohol   Drug use: No   Sexual activity: Never  Other Topics Concern   Not on file  Social History Narrative   Not on file   Social Determinants of Health   Financial Resource Strain: Not on file  Food Insecurity: Not on file  Transportation Needs: Not on file  Physical Activity: Not on file  Stress: Not on file  Social Connections: Not on file  Intimate Partner Violence: Not on file    FAMILY HISTORY Family History  Problem Relation Age of Onset   Heart failure Mother    Cancer Father        colon   Cancer Paternal Grandmother        type unknown    ALLERGIES:  has No Known Allergies.  MEDICATIONS:  Current Outpatient Medications  Medication Sig Dispense Refill   acetaminophen (TYLENOL) 500 MG tablet Take 500 mg by mouth every 6 (six) hours as needed (for pain.).     amiodarone (PACERONE) 200 MG tablet Take 200 mg by mouth daily. 0.5 tab (100 mg total) po daily     aspirin 81 MG tablet Take 81 mg by mouth daily.     carvedilol (COREG) 12.5 MG tablet Take 6.25 mg by mouth 2 (two) times daily with a meal.      cyanocobalamin (VITAMIN B12) 1000 MCG tablet Take 1 tablet by mouth daily.     famotidine (PEPCID) 40 MG tablet Take 40 mg at bedtime by mouth.     magnesium oxide (MAG-OX) 400 (240 Mg) MG tablet Take 1 tablet by mouth daily.     nitroGLYCERIN (NITROSTAT) 0.4 MG SL tablet Place 0.4 mg under the tongue every 5 (five) minutes as needed for chest pain.      ranolazine (RANEXA) 500 MG 12 hr tablet Take 500 mg by mouth  2 (two) times daily.      rosuvastatin (CRESTOR) 10 MG tablet Take 1 tablet by mouth daily.     No current facility-administered medications for this visit.    PHYSICAL EXAMINATION:  ECOG PERFORMANCE STATUS: 1 - Symptomatic but completely ambulatory   Vitals:   03/26/22 1427  BP: 121/86  Pulse: 86  Resp: 16  Temp: (!) 97.5 F (36.4 C)  SpO2: 97%    Filed Weights   03/26/22 1427  Weight: 156 lb 12.8 oz (71.1 kg)     Physical Exam Vitals and nursing note reviewed.  Constitutional:      Appearance: Normal appearance. He is not diaphoretic.     Comments: Here alone  HENT:     Head: Normocephalic and atraumatic.     Right Ear: External ear normal.     Left Ear: External ear normal.     Nose: Nose normal.  Eyes:     Conjunctiva/sclera: Conjunctivae normal.     Pupils: Pupils are equal, round, and reactive to light.  Cardiovascular:     Rate and Rhythm: Regular rhythm. Tachycardia present.     Heart sounds: Normal heart sounds.     No friction rub. No gallop.  Pulmonary:     Effort: Pulmonary effort is normal. No respiratory distress.     Breath sounds: Normal breath sounds. No stridor. No wheezing or rhonchi.  Abdominal:     Palpations: Abdomen is soft.     Tenderness: There is no abdominal tenderness. There is no guarding or rebound.  Musculoskeletal:        General: No deformity.     Cervical back: Normal range of motion and neck supple. No rigidity or tenderness.     Right lower leg: No edema.     Left lower leg: No edema.     Comments: Deformity of hands.  Ambulates with a cane  Lymphadenopathy:     Head:     Right side of head: No submental, submandibular, tonsillar, preauricular, posterior auricular or occipital adenopathy.     Left side of head: No submental, submandibular, tonsillar, preauricular, posterior auricular or occipital adenopathy.     Cervical: No cervical adenopathy.     Right cervical: No superficial, deep or posterior cervical adenopathy.    Left cervical: No  superficial, deep or posterior cervical adenopathy.     Upper Body:     Right upper body: No supraclavicular or axillary adenopathy.     Left upper body: No supraclavicular or axillary adenopathy.     Lower Body: No right inguinal adenopathy. No left inguinal adenopathy.  Skin:    Coloration: Skin is not jaundiced or pale.     Findings: No bruising.  Neurological:     General: No focal deficit present.     Mental Status: He is alert and oriented to person, place, and time.     Cranial Nerves: No cranial nerve deficit.     Gait: Gait abnormal.  Psychiatric:        Mood and Affect: Mood normal.        Behavior: Behavior normal.        Thought Content: Thought content normal.        Judgment: Judgment normal.     LABORATORY DATA: I have personally reviewed the data as listed:  Office Visit on 03/11/2022  Component Date Value Ref Range Status   WBC 03/11/2022 5.3  4.0 - 10.5 K/uL Final   RBC 03/11/2022 4.42  4.22 - 5.81  MIL/uL Final   Hemoglobin 03/11/2022 12.5 (L)  13.0 - 17.0 g/dL Final   HCT 03/11/2022 40.1  39.0 - 52.0 % Final   MCV 03/11/2022 90.7  80.0 - 100.0 fL Final   MCH 03/11/2022 28.3  26.0 - 34.0 pg Final   MCHC 03/11/2022 31.2  30.0 - 36.0 g/dL Final   RDW 03/11/2022 14.0  11.5 - 15.5 % Final   Platelets 03/11/2022 254  150 - 400 K/uL Final   nRBC 03/11/2022 0.0  0.0 - 0.2 % Final   Neutrophils Relative % 03/11/2022 49  % Final   Neutro Abs 03/11/2022 2.6  1.7 - 7.7 K/uL Final   Lymphocytes Relative 03/11/2022 26  % Final   Lymphs Abs 03/11/2022 1.4  0.7 - 4.0 K/uL Final   Monocytes Relative 03/11/2022 11  % Final   Monocytes Absolute 03/11/2022 0.6  0.1 - 1.0 K/uL Final   Eosinophils Relative 03/11/2022 13  % Final   Eosinophils Absolute 03/11/2022 0.7 (H)  0.0 - 0.5 K/uL Final   Basophils Relative 03/11/2022 1  % Final   Basophils Absolute 03/11/2022 0.0  0.0 - 0.1 K/uL Final   Immature Granulocytes 03/11/2022 0  % Final   Abs Immature Granulocytes  03/11/2022 0.01  0.00 - 0.07 K/uL Final   Performed at [facility], [place] [address], [place], [place] [address]   Sodium 03/11/2022 139  135 - 145 mmol/L Final   Potassium 03/11/2022 4.5  3.5 - 5.1 mmol/L Final   Chloride 03/11/2022 105  98 - 111 mmol/L Final   CO2 03/11/2022 24  22 - 32 mmol/L Final   Glucose, Bld 03/11/2022 95  70 - 99 mg/dL Final   Glucose reference range applies only to samples taken after fasting for at least 8 hours.   BUN 03/11/2022 27 (H)  8 - 23 mg/dL Final   Creatinine, Ser 03/11/2022 1.24  0.61 - 1.24 mg/dL Final   Calcium 03/11/2022 9.1  8.9 - 10.3 mg/dL Final   Total Protein 03/11/2022 6.9  6.5 - 8.1 g/dL Final   Albumin 03/11/2022 4.1  3.5 - 5.0 g/dL Final   AST 03/11/2022 17  15 - 41 U/L Final   ALT 03/11/2022 10  0 - 44 U/L Final   Alkaline Phosphatase 03/11/2022 48  38 - 126 U/L Final   Total Bilirubin 03/11/2022 0.6  0.3 - 1.2 mg/dL Final   GFR, Estimated 03/11/2022 59 (L)  >60 mL/min Final   Comment: (NOTE) Calculated using the CKD-EPI Creatinine Equation (2021)    Anion gap 03/11/2022 10  5 - 15 Final   Performed at [facility], [place] [address], [place], [place] [address]  Appointment on 03/11/2022  Component Date Value Ref Range Status   Prostatic Specific Antigen 03/11/2022 0.09  0.00 - 4.00 ng/mL Final   Comment: (NOTE) While PSA levels of <=4.00 ng/ml are reported as reference range, some men with levels below 4.00 ng/ml can have prostate cancer and many men with PSA above 4.00 ng/ml do not have prostate cancer.  Other tests such as free PSA, age specific reference ranges, PSA velocity and PSA doubling time may be helpful especially in men less than 52 years old. Performed at [facility], [place] [address], [place], [place] [address]    DAT, complement 03/11/2022 NEG   Final   DAT, IgG 03/11/2022    Final  Value:NEG Performed at [facility], [place] [address], [place], [place] [address]    Retic Ct Pct 03/11/2022 1.1  0.4 - 3.1 % Final   RBC. 03/11/2022 4.33  4.22 - 5.81 MIL/uL Final   Retic Count, Absolute 03/11/2022 47.6  19.0 - 186.0 K/uL Final   Immature Retic Fract 03/11/2022 12.6  2.3 - 15.9 % Final   Performed at [facility], [place] [address], [place], [place] [address]   Vitamin B-12 03/11/2022 170 (L)  180 - 914 pg/mL Final   Comment: (NOTE) This assay is not validated for testing neonatal or myeloproliferative syndrome specimens for Vitamin B12 levels. Performed at [facility], [place] [address], [place], [place] [address]    Haptoglobin 03/11/2022 163  34 - 355 mg/dL Final   Comment: (NOTE) Performed At: [facility] [place], [place] [id number] Rush Farmer MD [id number]    Folate 03/11/2022 16.9  >5.9 ng/mL Final   Performed at [facility], [place] [address] [place], [place] [address]   Ferritin 03/11/2022 21 (L)  24 - 336 ng/mL Final   Performed at [facility], [place] [address] [place], [place] [address]    RADIOGRAPHIC STUDIES: I have personally reviewed the radiological images as listed and agree with the findings in the report  No results found.  ASSESSMENT/PLAN  82 y.o. male is here because of prostate cancer.  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  Declined XRT September 07 2021:  PSA 0.1 March 11 2022:  Will check PSA today  Anemia:  Will obtain CBC with diff, CMP, Ferritin, B12, folate, retic count,  DAT, Haptoglobin.   Cancer Staging  Prostate cancer [facility]) Staging  form: Prostate, AJCC 7th Edition - Clinical: Stage IV (T3b, N1, M0, Gleason 8-10) - Signed by [name], MD on 08/01/2020    No problem-specific Assessment & Plan notes found for this encounter.    No orders of the defined types were placed in this encounter.   32  minutes was spent in patient care.  This included time spent preparing to see the patient (e.g., review of tests), obtaining and/or reviewing separately obtained history, counseling and educating the patient/family/caregiver, ordering medications, tests, or procedures; documenting clinical information in the electronic or other health record, independently interpreting results and communicating results to the patient/family/caregiver as well as coordination of care.       All questions were answered. The patient knows to call the clinic with any problems, questions or concerns.  This note was electronically signed.    Barbee Cough, MD  03/26/2022 2:35 PM

## 2022-03-28 DIAGNOSIS — E538 Deficiency of other specified B group vitamins: Secondary | ICD-10-CM | POA: Insufficient documentation

## 2022-03-29 ENCOUNTER — Ambulatory Visit: Payer: Medicare HMO

## 2022-03-29 DIAGNOSIS — I469 Cardiac arrest, cause unspecified: Secondary | ICD-10-CM | POA: Diagnosis not present

## 2022-03-29 DIAGNOSIS — I4901 Ventricular fibrillation: Secondary | ICD-10-CM | POA: Diagnosis not present

## 2022-04-05 ENCOUNTER — Ambulatory Visit: Payer: Medicare HMO

## 2022-04-09 DIAGNOSIS — M48062 Spinal stenosis, lumbar region with neurogenic claudication: Secondary | ICD-10-CM | POA: Diagnosis not present

## 2022-04-09 DIAGNOSIS — M4316 Spondylolisthesis, lumbar region: Secondary | ICD-10-CM | POA: Diagnosis not present

## 2022-04-09 DIAGNOSIS — M4326 Fusion of spine, lumbar region: Secondary | ICD-10-CM | POA: Diagnosis not present

## 2022-04-10 DIAGNOSIS — I080 Rheumatic disorders of both mitral and aortic valves: Secondary | ICD-10-CM | POA: Diagnosis not present

## 2022-04-11 ENCOUNTER — Ambulatory Visit: Payer: Medicare HMO

## 2022-04-12 ENCOUNTER — Ambulatory Visit: Payer: Medicare HMO

## 2022-04-19 ENCOUNTER — Ambulatory Visit: Payer: Medicare HMO

## 2022-04-23 DIAGNOSIS — Z45010 Encounter for checking and testing of cardiac pacemaker pulse generator [battery]: Secondary | ICD-10-CM | POA: Diagnosis not present

## 2022-05-01 DIAGNOSIS — I509 Heart failure, unspecified: Secondary | ICD-10-CM | POA: Diagnosis not present

## 2022-05-09 ENCOUNTER — Ambulatory Visit: Payer: Medicare HMO

## 2022-05-15 DIAGNOSIS — E875 Hyperkalemia: Secondary | ICD-10-CM | POA: Diagnosis not present

## 2022-05-17 ENCOUNTER — Ambulatory Visit: Payer: Medicare HMO

## 2022-05-30 DIAGNOSIS — M170 Bilateral primary osteoarthritis of knee: Secondary | ICD-10-CM | POA: Diagnosis not present

## 2022-05-30 DIAGNOSIS — M25561 Pain in right knee: Secondary | ICD-10-CM | POA: Diagnosis not present

## 2022-05-30 DIAGNOSIS — M25562 Pain in left knee: Secondary | ICD-10-CM | POA: Diagnosis not present

## 2022-06-06 ENCOUNTER — Ambulatory Visit: Payer: Medicare HMO

## 2022-06-07 DIAGNOSIS — M170 Bilateral primary osteoarthritis of knee: Secondary | ICD-10-CM | POA: Diagnosis not present

## 2022-06-14 ENCOUNTER — Ambulatory Visit: Payer: Medicare HMO

## 2022-06-18 DIAGNOSIS — Z0000 Encounter for general adult medical examination without abnormal findings: Secondary | ICD-10-CM | POA: Diagnosis not present

## 2022-06-18 DIAGNOSIS — E785 Hyperlipidemia, unspecified: Secondary | ICD-10-CM | POA: Diagnosis not present

## 2022-06-18 DIAGNOSIS — R7302 Impaired glucose tolerance (oral): Secondary | ICD-10-CM | POA: Diagnosis not present

## 2022-06-18 DIAGNOSIS — I255 Ischemic cardiomyopathy: Secondary | ICD-10-CM | POA: Diagnosis not present

## 2022-06-18 DIAGNOSIS — I25119 Atherosclerotic heart disease of native coronary artery with unspecified angina pectoris: Secondary | ICD-10-CM | POA: Diagnosis not present

## 2022-06-18 DIAGNOSIS — Z79899 Other long term (current) drug therapy: Secondary | ICD-10-CM | POA: Diagnosis not present

## 2022-06-18 DIAGNOSIS — I5022 Chronic systolic (congestive) heart failure: Secondary | ICD-10-CM | POA: Diagnosis not present

## 2022-06-18 DIAGNOSIS — I420 Dilated cardiomyopathy: Secondary | ICD-10-CM | POA: Diagnosis not present

## 2022-06-18 DIAGNOSIS — I110 Hypertensive heart disease with heart failure: Secondary | ICD-10-CM | POA: Diagnosis not present

## 2022-06-26 ENCOUNTER — Other Ambulatory Visit: Payer: Medicare HMO

## 2022-06-26 ENCOUNTER — Ambulatory Visit: Payer: Medicare HMO | Admitting: Oncology

## 2022-07-04 ENCOUNTER — Ambulatory Visit: Payer: Medicare HMO

## 2022-07-10 ENCOUNTER — Inpatient Hospital Stay: Payer: Medicare HMO

## 2022-07-10 ENCOUNTER — Inpatient Hospital Stay: Payer: Medicare HMO | Attending: Oncology | Admitting: Oncology

## 2022-07-10 ENCOUNTER — Other Ambulatory Visit: Payer: Self-pay

## 2022-07-10 VITALS — BP 133/74 | HR 79 | Temp 98.4°F | Resp 16 | Ht 66.0 in | Wt 157.0 lb

## 2022-07-10 DIAGNOSIS — E538 Deficiency of other specified B group vitamins: Secondary | ICD-10-CM | POA: Diagnosis not present

## 2022-07-10 DIAGNOSIS — C61 Malignant neoplasm of prostate: Secondary | ICD-10-CM

## 2022-07-10 DIAGNOSIS — Z87891 Personal history of nicotine dependence: Secondary | ICD-10-CM | POA: Diagnosis not present

## 2022-07-10 DIAGNOSIS — D649 Anemia, unspecified: Secondary | ICD-10-CM

## 2022-07-10 DIAGNOSIS — D509 Iron deficiency anemia, unspecified: Secondary | ICD-10-CM

## 2022-07-10 LAB — CBC WITH DIFFERENTIAL/PLATELET
Abs Immature Granulocytes: 0.02 10*3/uL (ref 0.00–0.07)
Basophils Absolute: 0 10*3/uL (ref 0.0–0.1)
Basophils Relative: 0 %
Eosinophils Absolute: 0.3 10*3/uL (ref 0.0–0.5)
Eosinophils Relative: 5 %
HCT: 40.7 % (ref 39.0–52.0)
Hemoglobin: 12.6 g/dL — ABNORMAL LOW (ref 13.0–17.0)
Immature Granulocytes: 0 %
Lymphocytes Relative: 23 %
Lymphs Abs: 1.2 10*3/uL (ref 0.7–4.0)
MCH: 30.1 pg (ref 26.0–34.0)
MCHC: 31 g/dL (ref 30.0–36.0)
MCV: 97.1 fL (ref 80.0–100.0)
Monocytes Absolute: 0.7 10*3/uL (ref 0.1–1.0)
Monocytes Relative: 13 %
Neutro Abs: 3.1 10*3/uL (ref 1.7–7.7)
Neutrophils Relative %: 59 %
Platelets: 239 10*3/uL (ref 150–400)
RBC: 4.19 MIL/uL — ABNORMAL LOW (ref 4.22–5.81)
RDW: 14.8 % (ref 11.5–15.5)
WBC: 5.4 10*3/uL (ref 4.0–10.5)
nRBC: 0 % (ref 0.0–0.2)

## 2022-07-10 LAB — COMPREHENSIVE METABOLIC PANEL
ALT: 17 U/L (ref 0–44)
AST: 21 U/L (ref 15–41)
Albumin: 3.9 g/dL (ref 3.5–5.0)
Alkaline Phosphatase: 51 U/L (ref 38–126)
Anion gap: 6 (ref 5–15)
BUN: 24 mg/dL — ABNORMAL HIGH (ref 8–23)
CO2: 27 mmol/L (ref 22–32)
Calcium: 9.3 mg/dL (ref 8.9–10.3)
Chloride: 106 mmol/L (ref 98–111)
Creatinine, Ser: 1.32 mg/dL — ABNORMAL HIGH (ref 0.61–1.24)
GFR, Estimated: 55 mL/min — ABNORMAL LOW (ref >60)
Glucose, Bld: 113 mg/dL — ABNORMAL HIGH (ref 70–99)
Potassium: 4.9 mmol/L (ref 3.5–5.1)
Sodium: 139 mmol/L (ref 135–145)
Total Bilirubin: 0.8 mg/dL (ref 0.3–1.2)
Total Protein: 6.7 g/dL (ref 6.5–8.1)

## 2022-07-10 LAB — PSA: Prostatic Specific Antigen: 0.12 ng/mL (ref 0.00–4.00)

## 2022-07-10 LAB — FOLATE: Folate: 37.8 ng/mL (ref >5.9)

## 2022-07-10 LAB — FERRITIN: Ferritin: 20 ng/mL — ABNORMAL LOW (ref 24–336)

## 2022-07-10 LAB — VITAMIN B12: Vitamin B-12: 3948 pg/mL — ABNORMAL HIGH (ref 180–914)

## 2022-07-10 NOTE — Progress Notes (Signed)
[facility] Cancer Follow up  Visit:  Patient Care Team: Street, Stephanie Coup, MD as PCP - General (Family Medicine)  CHIEF COMPLAINTS/PURPOSE OF CONSULTATION:  Oncology History  Prostate cancer [facility])  08/2014 Tumor Marker   PSA 10.1   11/01/2014 Tumor Marker   PSA 12.22   12/20/2014 Procedure   Prostate biopsy. 10/12 cores (+) for prostate adenoca. Gleason 9 (5+4) disease noted in 4 cores. PNI+. Gleason 9 (4+5) noted in 2 cores. PNI+.  Gleason 8 (5+3) in 1 core and Gleason 8 (4+4) in 1 core. Gleason 7 (4+3) in 2 cores. PNI+.    12/20/2014 Initial Diagnosis   Prostate cancer (HCC)   01/16/2015 Imaging   Bone scan: 2 punctate areas of increased activity noted about left face/skull. 1 punctate area of increased activity noted about right occipital skull. Maxillofacial and head CT with attention to bone windows suggested for further eval.   08/01/2020 Cancer Staging   Staging form: Prostate, AJCC 7th Edition - Clinical: Stage IV (T3b, N1, M0, Gleason 8-10) - Signed by Benjiman Core, MD on 08/01/2020     HISTORY OF PRESENTING ILLNESS: Jesse Mann 82 y.o. male is here because of prostate cancer Medical history notable for ischemic cardiomyopathy, cardiac arrhythmia requiring AICD placement, sleep apnea, MI  December 20, 2014 diagnosed with Gleason 9 (5+4 disease with perineural invasion) January 26, 2015: Bone scan showed 2 punctate areas of increased activity about the left face/skull and a punctate area of increased activity about the right occipital bone March 03, 2015 radical prostatectomy.  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  He declined radiation October 29, 2017 with last dose of Lupron   September 07, 2021: WBC 6.9 hemoglobin 12.3  platelet count 184; 63 segs 18 lymphs 10 monos 8 eos creatinine 1.3 PSA 0.1  January 14 2022:  L2-3 laminectomy and fusion with pedicle screws and TLIF, allograft and bone marrow aspiration, O-arm   March 11 2022:  About 5 weeks ago underwent spine surgery.  Last week AICD went off; he was subsequently admitted for evaluation which included a cardiac cath.    WBC 5.3 hemoglobin 12.5 platelet count 254; 49 segs 26 lymphs 11 monos 13 eos 1 basophil.  Reticulocytes 1.1%. Coombs test negative haptoglobin 163 Ferritin 21 folate 16.9 B12 170 PSA 0.09 CMP notable for creatinine 1.24 BUN 27  March 06 2022:  Cardiac ECHO- LV is moderately dilated. Mild left ventricular hypertrophy. LV ejection fraction = 35-40%. There is mild to moderate global hypokinesis of the left ventricle.  Left heart cath- Coronary Findings Diagnostic Dominance: Right  Left Main: Dist LM to Ost LAD lesion is 20% stenosed. Left Anterior Descending: Prox LAD to Mid LAD lesion is 10% stenosed. First Diagonal Branch: 1st Diag lesion is 20% stenosed. Left Circumflex: Ost Cx to Prox Cx lesion is 20% stenosed. Prox Cx to Mid Cx lesion is 30% stenosed. Right Coronary Artery: Prox RCA to Mid RCA lesion is 100% stenosed. The lesion is chronically occluded. The lesion was not previously treated.  Intervention- No interventions have been documented.   March 7 through March 22, 2022:  Received 1000 mcg of vitamin B12 x 7 doses   March 26 2022:  Patient on ASA 81 mg daily.  Last colonoscopy was in 2011.  Discussed referral to  GI for consideration of endoscopy.  He is hesitant to consider it because of age.  Will continue to follow Hgb, ferritin levels and if downward trend then rediscuss GI consult.  Will switch from B12 sq to sublingual.  Had not previously been taking B12 regularly.  Fatigued since carvedilol dose increased by cardiology.  (I encouraged him to discuss this with cardiology)  July 10 2022:   Scheduled follow up for  management of prostate cancer ane anemia.   Gait improved with recovery from spine surgery.  Taking B12 sublingual.  Remains on ASA 81 mg daily.  Has gotten used to increased carvedilol dose.  Defibrillator has not gone off again.  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       Has lost 5 lbs  HENT:   Negative for mouth sores, nosebleeds, sore throat, tinnitus and trouble swallowing.   Eyes:  Negative for eye problems and icterus.       Vision changes:  None  Respiratory:  Negative for chest tightness, cough, hemoptysis, shortness of breath and wheezing.        PND:  none Orthopnea:  none DOE:    Cardiovascular:  Negative for chest pain, leg swelling and palpitations.       PND:  none Orthopnea:  none  Gastrointestinal:  Negative for abdominal pain, constipation, diarrhea, nausea and vomiting.       Occasional hematochezia attributed to hemorrhoids   Endocrine: Negative for hot flashes.       Cold intolerance:  none Heat intolerance:  none  Genitourinary:  Negative for bladder incontinence, difficulty urinating, dysuria, frequency, hematuria and nocturia.   Musculoskeletal:  Positive for arthralgias, back pain and gait problem. Negative for myalgias.  Skin:  Negative for itching, rash and wound.  Neurological:  Positive for extremity weakness and gait problem. Negative for dizziness, headaches, light-headedness, numbness, seizures and speech difficulty.       Ambulates with a cane  Hematological:  Negative for adenopathy. Does not bruise/bleed easily.  Psychiatric/Behavioral:  Negative for sleep disturbance and suicidal ideas. The patient is not nervous/anxious.     MEDICAL HISTORY: Past Medical History:  Diagnosis Date   AICD (automatic cardioverter/defibrillator) present    Arthritis    fingers- right (2-4 fingers with ROM  issues).knee left   Bifascicular block    Cardiac arrest (HCC) [facility]   Cardiac arrhythmia    Dr. Revankar,cardiology [phone] [facility])   Cardiac murmur    Cardiomyopathy [facility])    Ischemic   CHF (congestive heart failure) (HCC)    Coronary artery disease    Flail chest    GERD (gastroesophageal reflux disease)    Hemorrhoids    History of tracheostomy    History of ventricular fibrillation 07/22/2016   Hypercholesteremia    Hypertension    Left anterior fascicular block (LAFB)    Myocardial infarction (HCC)    7'14[facility]- "feeling strange" eavaluated, heart cath done with stents x2 placed   Pneumonia    history of   Presence of permanent cardiac pacemaker    Prostate cancer (HCC)    PVC (premature ventricular contraction)    RBBB    Recurrent dry cough    intermittent daily -occ. production   Sleep apnea  SURGICAL HISTORY: Past Surgical History:  Procedure Laterality Date   BACK SURGERY     With Rods and screws   CARDIAC CATHETERIZATION     '14- stents placed x2 only.   CARDIAC CATHETERIZATION     COLONOSCOPY  2011   CORONARY ANGIOPLASTY     CYSTOSCOPY W/ URETERAL STENT PLACEMENT Bilateral 04/16/2017   Procedure: CYSTOSCOPY WITH CATHETER  REPLACEMENT FIREFLY;  Surgeon: Crist Fat, MD;  Location: [facility];  Service: Urology;  Laterality: Bilateral;   HAND SURGERY     HERNIA REPAIR  1997 and 1995   inguinal (x2 on one side)   ICD IMPLANT     INGUINAL HERNIA REPAIR     INSERT / REPLACE / REMOVE PACEMAKER     LYMPH NODE DISSECTION Bilateral 03/03/2015   Procedure: BILATERAL PELVIC LYMPH NODE DISSECTION;  Surgeon: Crist Fat, MD;  Location: [facility];  Service: Urology;  Laterality: Bilateral;   PROSTATE BIOPSY     REPAIR EXTENSOR TENDON HAND Right    (3-4 fingers) '65- x3-4 times"trauma injury   ROBOT ASSISTED LAPAROSCOPIC RADICAL PROSTATECTOMY  Bilateral 03/03/2015   Procedure: XI ROBOTIC ASSISTED LAPAROSCOPIC RADICAL PROSTATECTOMY;  Surgeon: Crist Fat, MD;  Location: [facility];  Service: Urology;  Laterality: Bilateral;   Surgery to correct flail chest after CPR     TONSILLECTOMY     TRACHEOSTOMY      SOCIAL HISTORY: Social History   Socioeconomic History   Marital status: Married    Spouse name: Not on file   Number of children: 3   Years of education: Not on file   Highest education level: Not on file  Occupational History   Occupation: Retired  Tobacco Use   Smoking status: Former    Packs/day: 1.00    Years: 10.00    Additional pack years: 0.00    Total pack years: 10.00    Types: Cigarettes    Quit date: 01/07/1966    Years since quitting: 56.5   Smokeless tobacco: Never  Vaping Use   Vaping Use: Never used  Substance and Sexual Activity   Alcohol use: No    Alcohol/week: 0.0 standard drinks of alcohol   Drug use: No   Sexual activity: Never  Other Topics Concern   Not on file  Social History Narrative   Not on file   Social Determinants of Health   Financial Resource Strain: Not on file  Food Insecurity: Not on file  Transportation Needs: Not on file  Physical Activity: Not on file  Stress: Not on file  Social Connections: Not on file  Intimate Partner Violence: Not on file    FAMILY HISTORY Family History  Problem Relation Age of Onset   Heart failure Mother    Cancer Father        colon   Cancer Paternal Grandmother        type unknown    ALLERGIES:  has No Known Allergies.  MEDICATIONS:  Current Outpatient Medications  Medication Sig Dispense Refill   acetaminophen (TYLENOL) 500 MG tablet Take 500 mg by mouth every 6 (six) hours as needed (for pain.).     amiodarone (PACERONE) 200 MG tablet Take 200 mg by mouth daily. 0.5 tab (100 mg total) po daily     aspirin 81 MG tablet Take 81 mg by mouth daily.     carvedilol (COREG) 12.5 MG tablet Take 6.25 mg by mouth 2 (two) times  daily with a meal.  cyanocobalamin (VITAMIN B12) 1000 MCG tablet Take 1 tablet by mouth daily.     famotidine (PEPCID) 40 MG tablet Take 40 mg at bedtime by mouth.     magnesium oxide (MAG-OX) 400 (240 Mg) MG tablet Take 1 tablet by mouth daily.     nitroGLYCERIN (NITROSTAT) 0.4 MG SL tablet Place 0.4 mg under the tongue every 5 (five) minutes as needed for chest pain.      ranolazine (RANEXA) 500 MG 12 hr tablet Take 500 mg by mouth 2 (two) times daily.     rosuvastatin (CRESTOR) 10 MG tablet Take 1 tablet by mouth daily.     No current facility-administered medications for this visit.    PHYSICAL EXAMINATION:  ECOG PERFORMANCE STATUS: 1 - Symptomatic but completely ambulatory   There were no vitals filed for this visit.   There were no vitals filed for this visit.    Physical Exam Vitals and nursing note reviewed.  Constitutional:      Appearance: Normal appearance. He is not diaphoretic.     Comments: Here alone  HENT:     Head: Normocephalic and atraumatic.     Right Ear: External ear normal.     Left Ear: External ear normal.     Nose: Nose normal.  Eyes:     Conjunctiva/sclera: Conjunctivae normal.     Pupils: Pupils are equal, round, and reactive to light.  Cardiovascular:     Rate and Rhythm: Regular rhythm. Tachycardia present.     Heart sounds: Normal heart sounds.     No friction rub. No gallop.  Pulmonary:     Effort: Pulmonary effort is normal. No respiratory distress.     Breath sounds: Normal breath sounds. No stridor. No wheezing or rhonchi.  Abdominal:     Palpations: Abdomen is soft.     Tenderness: There is no abdominal tenderness. There is no guarding or rebound.  Musculoskeletal:        General: No deformity.     Cervical back: Normal range of motion and neck supple. No rigidity or tenderness.     Right lower leg: No edema.     Left lower leg: No edema.     Comments: Deformity of hands.  Ambulates with a cane  Lymphadenopathy:     Head:      Right side of head: No submental, submandibular, tonsillar, preauricular, posterior auricular or occipital adenopathy.     Left side of head: No submental, submandibular, tonsillar, preauricular, posterior auricular or occipital adenopathy.     Cervical: No cervical adenopathy.     Right cervical: No superficial, deep or posterior cervical adenopathy.    Left cervical: No superficial, deep or posterior cervical adenopathy.     Upper Body:     Right upper body: No supraclavicular or axillary adenopathy.     Left upper body: No supraclavicular or axillary adenopathy.     Lower Body: No right inguinal adenopathy. No left inguinal adenopathy.  Skin:    Coloration: Skin is not jaundiced or pale.     Findings: No bruising.  Neurological:     General: No focal deficit present.     Mental Status: He is alert and oriented to person, place, and time.     Cranial Nerves: No cranial nerve deficit.     Gait: Gait abnormal.  Psychiatric:        Mood and Affect: Mood normal.        Behavior: Behavior normal.  Thought Content: Thought content normal.        Judgment: Judgment normal.     LABORATORY DATA: I have personally reviewed the data as listed:  No visits with results within 1 Month(s) from this visit.  Latest known visit with results is:  Office Visit on 03/11/2022  Component Date Value Ref Range Status   WBC 03/11/2022 5.3  4.0 - 10.5 K/uL Final   RBC 03/11/2022 4.42  4.22 - 5.81 MIL/uL Final   Hemoglobin 03/11/2022 12.5 (L)  13.0 - 17.0 g/dL Final   HCT 78/29/5621 40.1  39.0 - 52.0 % Final   MCV 03/11/2022 90.7  80.0 - 100.0 fL Final   MCH 03/11/2022 28.3  26.0 - 34.0 pg Final   MCHC 03/11/2022 31.2  30.0 - 36.0 g/dL Final   RDW 30/86/5784 14.0  11.5 - 15.5 % Final   Platelets 03/11/2022 254  150 - 400 K/uL Final   nRBC 03/11/2022 0.0  0.0 - 0.2 % Final   Neutrophils Relative % 03/11/2022 49  % Final   Neutro Abs 03/11/2022 2.6  1.7 - 7.7 K/uL Final   Lymphocytes Relative  03/11/2022 26  % Final   Lymphs Abs 03/11/2022 1.4  0.7 - 4.0 K/uL Final   Monocytes Relative 03/11/2022 11  % Final   Monocytes Absolute 03/11/2022 0.6  0.1 - 1.0 K/uL Final   Eosinophils Relative 03/11/2022 13  % Final   Eosinophils Absolute 03/11/2022 0.7 (H)  0.0 - 0.5 K/uL Final   Basophils Relative 03/11/2022 1  % Final   Basophils Absolute 03/11/2022 0.0  0.0 - 0.1 K/uL Final   Immature Granulocytes 03/11/2022 0  % Final   Abs Immature Granulocytes 03/11/2022 0.01  0.00 - 0.07 K/uL Final   Performed at [facility], [address], [place], [place] [address]   Sodium 03/11/2022 139  135 - 145 mmol/L Final   Potassium 03/11/2022 4.5  3.5 - 5.1 mmol/L Final   Chloride 03/11/2022 105  98 - 111 mmol/L Final   CO2 03/11/2022 24  22 - 32 mmol/L Final   Glucose, Bld 03/11/2022 95  70 - 99 mg/dL Final   Glucose reference range applies only to samples taken after fasting for at least 8 hours.   BUN 03/11/2022 27 (H)  8 - 23 mg/dL Final   Creatinine, Ser 03/11/2022 1.24  0.61 - 1.24 mg/dL Final   Calcium 52/84/1324 9.1  8.9 - 10.3 mg/dL Final   Total Protein 40/10/2723 6.9  6.5 - 8.1 g/dL Final   Albumin 36/64/4034 4.1  3.5 - 5.0 g/dL Final   AST 74/25/9563 17  15 - 41 U/L Final   ALT 03/11/2022 10  0 - 44 U/L Final   Alkaline Phosphatase 03/11/2022 48  38 - 126 U/L Final   Total Bilirubin 03/11/2022 0.6  0.3 - 1.2 mg/dL Final   GFR, Estimated 03/11/2022 59 (L)  >60 mL/min Final   Comment: (NOTE) Calculated using the CKD-EPI Creatinine Equation (2021)    Anion gap 03/11/2022 10  5 - 15 Final   Performed at [facility], [address] [place], [place] [address]    RADIOGRAPHIC STUDIES: I have personally reviewed the radiological images as listed and agree with the findings in the report  No results found.  ASSESSMENT/PLAN  82 y.o. male is here because of prostate cancer.  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  Declined XRT September 07 2021:  PSA 0.1 March 11 2022:  PSA 0.09 July 10 2022- PSA 0.12; slow elevation.  Continue to follow  Anemia:  Etiology likely multifactorial with possible culprits being 1) Iron deficiency from occult GI blood loss 2) CKD 3) Vitamin B12 deficiency March 14 2022- Began parenteral B12 replacement March 26 2022- Discussed consideration of GI consult.  Patient would like to hold on this for now July 10 2022-  Hgb 12.6 B12 3948 Ferritin 20 Folate 37.8.  Hgb stable.  Can consider IV iron in the future.  B12 and folate adequate     Cancer Staging  Prostate cancer [facility]) Staging form: Prostate, AJCC 7th Edition - Clinical: Stage IV (T3b, N1, M0, Gleason 8-10) - Signed by Benjiman Core, MD on 08/01/2020    No problem-specific Assessment & Plan notes found for this encounter.    No orders of the defined types were placed in this encounter.   20  minutes was spent in patient care.  This included time spent preparing to see the patient (e.g., review of tests), obtaining and/or reviewing separately obtained history, counseling and educating the patient/family/caregiver, ordering medications, tests, or procedures; documenting clinical information in the electronic or other health record, independently interpreting results and communicating results to the patient/family/caregiver as well as coordination of care.       All questions were answered. The patient knows to call the clinic with any problems, questions or concerns.  This note was electronically signed.    Loni Muse, MD  07/10/2022 2:11 PM

## 2022-07-12 ENCOUNTER — Ambulatory Visit: Payer: Medicare HMO

## 2022-07-15 ENCOUNTER — Telehealth: Payer: Self-pay | Admitting: Oncology

## 2022-07-15 NOTE — Telephone Encounter (Signed)
07/15/22 Spoke with patient and scheduled next appt on 10/10/22@930am 

## 2022-07-16 DIAGNOSIS — M170 Bilateral primary osteoarthritis of knee: Secondary | ICD-10-CM | POA: Diagnosis not present

## 2022-07-23 DIAGNOSIS — M4316 Spondylolisthesis, lumbar region: Secondary | ICD-10-CM | POA: Diagnosis not present

## 2022-07-23 DIAGNOSIS — M48062 Spinal stenosis, lumbar region with neurogenic claudication: Secondary | ICD-10-CM | POA: Diagnosis not present

## 2022-07-23 DIAGNOSIS — M170 Bilateral primary osteoarthritis of knee: Secondary | ICD-10-CM | POA: Diagnosis not present

## 2022-07-23 DIAGNOSIS — M4326 Fusion of spine, lumbar region: Secondary | ICD-10-CM | POA: Diagnosis not present

## 2022-08-01 ENCOUNTER — Ambulatory Visit: Payer: Medicare HMO

## 2022-08-05 DIAGNOSIS — Z931 Gastrostomy status: Secondary | ICD-10-CM | POA: Diagnosis not present

## 2022-08-05 DIAGNOSIS — I2510 Atherosclerotic heart disease of native coronary artery without angina pectoris: Secondary | ICD-10-CM | POA: Diagnosis not present

## 2022-08-05 DIAGNOSIS — R079 Chest pain, unspecified: Secondary | ICD-10-CM | POA: Diagnosis not present

## 2022-08-05 DIAGNOSIS — I429 Cardiomyopathy, unspecified: Secondary | ICD-10-CM | POA: Diagnosis not present

## 2022-08-05 DIAGNOSIS — I469 Cardiac arrest, cause unspecified: Secondary | ICD-10-CM | POA: Diagnosis not present

## 2022-08-05 DIAGNOSIS — I4901 Ventricular fibrillation: Secondary | ICD-10-CM | POA: Diagnosis not present

## 2022-08-05 DIAGNOSIS — Z95810 Presence of automatic (implantable) cardiac defibrillator: Secondary | ICD-10-CM | POA: Diagnosis not present

## 2022-08-09 ENCOUNTER — Ambulatory Visit: Payer: Medicare HMO

## 2022-08-12 DIAGNOSIS — M170 Bilateral primary osteoarthritis of knee: Secondary | ICD-10-CM | POA: Diagnosis not present

## 2022-08-16 DIAGNOSIS — I429 Cardiomyopathy, unspecified: Secondary | ICD-10-CM | POA: Diagnosis not present

## 2022-08-19 DIAGNOSIS — M170 Bilateral primary osteoarthritis of knee: Secondary | ICD-10-CM | POA: Diagnosis not present

## 2022-08-27 DIAGNOSIS — M170 Bilateral primary osteoarthritis of knee: Secondary | ICD-10-CM | POA: Diagnosis not present

## 2022-08-29 ENCOUNTER — Ambulatory Visit: Payer: Medicare HMO

## 2022-09-06 ENCOUNTER — Ambulatory Visit: Payer: Medicare HMO

## 2022-09-11 ENCOUNTER — Other Ambulatory Visit: Payer: Medicare HMO

## 2022-09-11 ENCOUNTER — Ambulatory Visit: Payer: Medicare HMO | Admitting: Oncology

## 2022-10-04 ENCOUNTER — Ambulatory Visit: Payer: Medicare HMO

## 2022-10-08 DIAGNOSIS — I469 Cardiac arrest, cause unspecified: Secondary | ICD-10-CM | POA: Diagnosis not present

## 2022-10-08 DIAGNOSIS — I454 Nonspecific intraventricular block: Secondary | ICD-10-CM | POA: Diagnosis not present

## 2022-10-08 DIAGNOSIS — I4901 Ventricular fibrillation: Secondary | ICD-10-CM | POA: Diagnosis not present

## 2022-10-08 DIAGNOSIS — M170 Bilateral primary osteoarthritis of knee: Secondary | ICD-10-CM | POA: Diagnosis not present

## 2022-10-08 DIAGNOSIS — I429 Cardiomyopathy, unspecified: Secondary | ICD-10-CM | POA: Diagnosis not present

## 2022-10-08 DIAGNOSIS — Z79899 Other long term (current) drug therapy: Secondary | ICD-10-CM | POA: Diagnosis not present

## 2022-10-08 DIAGNOSIS — I498 Other specified cardiac arrhythmias: Secondary | ICD-10-CM | POA: Diagnosis not present

## 2022-10-08 DIAGNOSIS — I2510 Atherosclerotic heart disease of native coronary artery without angina pectoris: Secondary | ICD-10-CM | POA: Diagnosis not present

## 2022-10-08 DIAGNOSIS — Z95810 Presence of automatic (implantable) cardiac defibrillator: Secondary | ICD-10-CM | POA: Diagnosis not present

## 2022-10-08 NOTE — Progress Notes (Signed)
[facility] Cancer Follow up  Visit:  Patient Care Team: Street, Stephanie Coup, MD as PCP - General (Family Medicine)  CHIEF COMPLAINTS/PURPOSE OF CONSULTATION:  Oncology History  Prostate cancer [facility])  08/2014 Tumor Marker   PSA 10.1   11/01/2014 Tumor Marker   PSA 12.22   12/20/2014 Procedure   Prostate biopsy. 10/12 cores (+) for prostate adenoca. Gleason 9 (5+4) disease noted in 4 cores. PNI+. Gleason 9 (4+5) noted in 2 cores. PNI+.  Gleason 8 (5+3) in 1 core and Gleason 8 (4+4) in 1 core. Gleason 7 (4+3) in 2 cores. PNI+.    12/20/2014 Initial Diagnosis   Prostate cancer (HCC)   01/16/2015 Imaging   Bone scan: 2 punctate areas of increased activity noted about left face/skull. 1 punctate area of increased activity noted about right occipital skull. Maxillofacial and head CT with attention to bone windows suggested for further eval.   08/01/2020 Cancer Staging   Staging form: Prostate, AJCC 7th Edition - Clinical: Stage IV (T3b, N1, M0, Gleason 8-10) - Signed by Benjiman Core, MD on 08/01/2020     HISTORY OF PRESENTING ILLNESS: Jesse Mann 82 y.o. male is here because of prostate cancer Medical history notable for ischemic cardiomyopathy, cardiac arrhythmia requiring AICD placement, sleep apnea, MI, rheumatoid arthritis  December 20, 2014 diagnosed with Gleason 9 (5+4 disease with perineural invasion) January 26, 2015: Bone scan showed 2 punctate areas of increased activity about the left face/skull and a punctate area of increased activity about the right occipital bone March 03, 2015 radical prostatectomy.  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  He declined radiation October 29, 2017 with last dose of Lupron   September 07, 2021: WBC  6.9 hemoglobin 12.3 platelet count 184; 63 segs 18 lymphs 10 monos 8 eos creatinine 1.3 PSA 0.1  January 14 2022:  L2-3 laminectomy and fusion with pedicle screws and TLIF, allograft and bone marrow aspiration, O-arm   March 11 2022:  About 5 weeks ago underwent spine surgery.  Last week AICD went off; he was subsequently admitted for evaluation which included a cardiac cath.    WBC 5.3 hemoglobin 12.5 platelet count 254; 49 segs 26 lymphs 11 monos 13 eos 1 basophil.  Reticulocytes 1.1%. Coombs test negative haptoglobin 163 Ferritin 21 folate 16.9 B12 170 PSA 0.09 CMP notable for creatinine 1.24 BUN 27  March 06 2022:  Cardiac ECHO- LV is moderately dilated. Mild left ventricular hypertrophy. LV ejection fraction = 35-40%. There is mild to moderate global hypokinesis of the left ventricle.  Left heart cath- Coronary Findings Diagnostic Dominance: Right  Left Main: Dist LM to Ost LAD lesion is 20% stenosed. Left Anterior Descending: Prox LAD to Mid LAD lesion is 10% stenosed. First Diagonal Branch: 1st Diag lesion is 20% stenosed. Left Circumflex: Ost Cx to Prox Cx lesion is 20% stenosed. Prox Cx to Mid Cx lesion is 30% stenosed. Right Coronary Artery: Prox RCA to Mid RCA lesion is 100% stenosed. The lesion is chronically occluded. The lesion was not previously treated.  Intervention- No interventions have been documented.   March 7 through March 22, 2022:  Received 1000 mcg of vitamin B12 x 7 doses   March 26 2022:  Patient on ASA 81 mg daily.  Last colonoscopy was in 2011.  Discussed  referral to GI for consideration of endoscopy.  He is hesitant to consider it because of age.  Will continue to follow Hgb, ferritin levels and if downward trend then rediscuss GI consult.  Will switch from B12 sq to sublingual.  Had not previously been taking B12 regularly.  Fatigued since carvedilol dose increased by cardiology.  (I encouraged him to discuss this with cardiology)  July 10 2022:     Gait  improved with recovery from spine surgery.  Taking B12 sublingual.  Remains on ASA 81 mg daily.  Has gotten used to increased carvedilol dose.  Defibrillator has not gone off again.  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  Is currently being evaluated for knee replacement.  To see orthopedic surgeon this week.   He was inquiring about having both knees simultaneously.  Informed him that is not likely going to be possible.    Hgb 12.4 Ferritin 20 B12 2498 Folate 18.5   PSA 0.15  Cr 1.45    Review of Systems  Constitutional:  Negative for chills, fatigue and fever.       Has lost 5 lbs  HENT:   Negative for mouth sores, nosebleeds, sore throat, tinnitus and trouble swallowing.   Eyes:  Negative for eye problems and icterus.       Vision changes:  None  Respiratory:  Negative for chest tightness, cough, hemoptysis, shortness of breath and wheezing.        PND:  none Orthopnea:  none DOE:    Cardiovascular:  Negative for chest pain, leg swelling and palpitations.       PND:  none Orthopnea:  none  Gastrointestinal:  Negative for abdominal pain, constipation, diarrhea, nausea and vomiting.       Occasional hematochezia attributed to hemorrhoids   Endocrine: Negative for hot flashes.       Cold intolerance:  none Heat intolerance:  none  Genitourinary:  Negative for bladder incontinence, difficulty urinating, dysuria, frequency, hematuria and nocturia.   Musculoskeletal:  Positive for arthralgias, back pain and gait problem. Negative for myalgias.  Skin:  Negative for itching, rash and wound.  Neurological:  Positive for extremity weakness and gait problem. Negative for dizziness, headaches, light-headedness, numbness, seizures and speech difficulty.       Ambulates with a cane  Hematological:  Negative for adenopathy. Does not  bruise/bleed easily.  Psychiatric/Behavioral:  Negative for sleep disturbance and suicidal ideas. The patient is not nervous/anxious.     MEDICAL HISTORY: Past Medical History:  Diagnosis Date   AICD (automatic cardioverter/defibrillator) present    Arthritis    fingers- right (2-4 fingers with ROM issues).knee left   Bifascicular block    Cardiac arrest (HCC) [facility]   Cardiac arrhythmia    Dr. Revankar,cardiology [phone] [facility])   Cardiac murmur    Cardiomyopathy [facility])    Ischemic   CHF (congestive heart failure) (HCC)    Coronary artery disease    Flail chest    GERD (gastroesophageal reflux disease)    Hemorrhoids    History of tracheostomy    History of ventricular fibrillation 07/22/2016   Hypercholesteremia    Hypertension    Left anterior fascicular block (LAFB)    Myocardial infarction (HCC)    7'14[facility]- "  feeling strange" eavaluated, heart cath done with stents x2 placed   Pneumonia    history of   Presence of permanent cardiac pacemaker    Prostate cancer (HCC)    PVC (premature ventricular contraction)    RBBB    Recurrent dry cough    intermittent daily -occ. production   Sleep apnea     SURGICAL HISTORY: Past Surgical History:  Procedure Laterality Date   BACK SURGERY     With Rods and screws   CARDIAC CATHETERIZATION     '14- stents placed x2 only.   CARDIAC CATHETERIZATION     COLONOSCOPY  2011   CORONARY ANGIOPLASTY     CYSTOSCOPY W/ URETERAL STENT PLACEMENT Bilateral 04/16/2017   Procedure: CYSTOSCOPY WITH CATHETER  REPLACEMENT FIREFLY;  Surgeon: Crist Fat, MD;  Location: [facility];  Service: Urology;  Laterality: Bilateral;   HAND SURGERY     HERNIA REPAIR  1997 and 1995   inguinal (x2 on one side)   ICD IMPLANT     INGUINAL HERNIA REPAIR     INSERT / REPLACE / REMOVE PACEMAKER     LYMPH NODE DISSECTION Bilateral 03/03/2015    Procedure: BILATERAL PELVIC LYMPH NODE DISSECTION;  Surgeon: Crist Fat, MD;  Location: [facility];  Service: Urology;  Laterality: Bilateral;   PROSTATE BIOPSY     REPAIR EXTENSOR TENDON HAND Right    (3-4 fingers) '65- x3-4 times"trauma injury   ROBOT ASSISTED LAPAROSCOPIC RADICAL PROSTATECTOMY Bilateral 03/03/2015   Procedure: XI ROBOTIC ASSISTED LAPAROSCOPIC RADICAL PROSTATECTOMY;  Surgeon: Crist Fat, MD;  Location: [facility];  Service: Urology;  Laterality: Bilateral;   Surgery to correct flail chest after CPR     TONSILLECTOMY     TRACHEOSTOMY      SOCIAL HISTORY: Social History   Socioeconomic History   Marital status: Married    Spouse name: Not on file   Number of children: 3   Years of education: Not on file   Highest education level: Not on file  Occupational History   Occupation: Retired  Tobacco Use   Smoking status: Former    Current packs/day: 0.00    Average packs/day: 1 pack/day for 10.0 years (10.0 ttl pk-yrs)    Types: Cigarettes    Start date: 01/08/1956    Quit date: 01/07/1966    Years since quitting: 56.7   Smokeless tobacco: Never  Vaping Use   Vaping status: Never Used  Substance and Sexual Activity   Alcohol use: No    Alcohol/week: 0.0 standard drinks of alcohol   Drug use: No   Sexual activity: Never  Other Topics Concern   Not on file  Social History Narrative   Not on file   Social Determinants of Health   Financial Resource Strain: Not on file  Food Insecurity: Low Risk  (03/06/2022)   [facility]   Hunger Vital Sign    Worried About Running Out of Food in the Last Year: Never true    Within the past 12 months, the food you bought just didn't last and you didn't have money to get more: Not on file  Transportation Needs: No Transportation Needs (03/06/2022)   [facility]   Transportation    In the past 12 months, has lack of reliable transportation kept you from  medical appointments, meetings, work or from getting things needed for daily living? : No  Physical Activity: Not on file  Stress: Not on  file  Social Connections: Not on file  Intimate Partner Violence: Low Risk  (03/06/2022)   Received from [facility] visits prior to 03/09/2022., [facility] visits prior to 03/09/2022.   Safety    How often does anyone, including family and friends, physically hurt you?: Never    How often does anyone, including family and friends, insult or talk down to you?: Never    How often does anyone, including family and friends, threaten you with harm?: Never    How often does anyone, including family and friends, scream or curse at you?: Never    FAMILY HISTORY Family History  Problem Relation Age of Onset   Heart failure Mother    Cancer Father        colon   Cancer Paternal Grandmother        type unknown    ALLERGIES:  has No Known Allergies.  MEDICATIONS:  Current Outpatient Medications  Medication Sig Dispense Refill   acetaminophen (TYLENOL) 500 MG tablet Take 500 mg by mouth every 6 (six) hours as needed (for pain.).     amiodarone (PACERONE) 200 MG tablet Take 200 mg by mouth daily. 0.5 tab (100 mg total) po daily     aspirin 81 MG tablet Take 81 mg by mouth daily.     carvedilol (COREG) 12.5 MG tablet Take 6.25 mg by mouth 2 (two) times daily with a meal.      cyanocobalamin (VITAMIN B12) 1000 MCG tablet Take 1 tablet by mouth daily.     famotidine (PEPCID) 40 MG tablet Take 40 mg at bedtime by mouth.     magnesium oxide (MAG-OX) 400 (240 Mg) MG tablet Take 1 tablet by mouth daily.     nitroGLYCERIN (NITROSTAT) 0.4 MG SL tablet Place 0.4 mg under the tongue every 5 (five) minutes as needed for chest pain.      ranolazine (RANEXA) 500 MG 12 hr tablet Take 500 mg by mouth 2 (two) times daily. (Patient not taking: Reported on 07/10/2022)     rosuvastatin (CRESTOR) 10 MG tablet Take 1 tablet by mouth daily.      No current facility-administered medications for this visit.    PHYSICAL EXAMINATION:  ECOG PERFORMANCE STATUS: 1 - Symptomatic but completely ambulatory   There were no vitals filed for this visit.   There were no vitals filed for this visit.    Physical Exam Vitals and nursing note reviewed.  Constitutional:      Appearance: Normal appearance. He is not diaphoretic.     Comments: Here alone  HENT:     Head: Normocephalic and atraumatic.     Right Ear: External ear normal.     Left Ear: External ear normal.     Nose: Nose normal.  Eyes:     Conjunctiva/sclera: Conjunctivae normal.     Pupils: Pupils are equal, round, and reactive to light.  Cardiovascular:     Rate and Rhythm: Regular rhythm. Tachycardia present.     Heart sounds: Normal heart sounds.     No friction rub. No gallop.  Pulmonary:     Effort: Pulmonary effort is normal. No respiratory distress.     Breath sounds: Normal breath sounds. No stridor. No wheezing or rhonchi.  Abdominal:     Palpations: Abdomen is soft.     Tenderness: There is no abdominal tenderness. There is no guarding or rebound.  Musculoskeletal:        General: Deformity present.  Cervical back: Normal range of motion and neck supple. No rigidity or tenderness.     Right lower leg: No edema.     Left lower leg: No edema.     Comments: Deformity of hands secondary RA.  Ambulates with a cane  Lymphadenopathy:     Head:     Right side of head: No submental, submandibular, tonsillar, preauricular, posterior auricular or occipital adenopathy.     Left side of head: No submental, submandibular, tonsillar, preauricular, posterior auricular or occipital adenopathy.     Cervical: No cervical adenopathy.     Right cervical: No superficial, deep or posterior cervical adenopathy.    Left cervical: No superficial, deep or posterior cervical adenopathy.     Upper Body:     Right upper body: No supraclavicular or axillary adenopathy.      Left upper body: No supraclavicular or axillary adenopathy.     Lower Body: No right inguinal adenopathy. No left inguinal adenopathy.  Skin:    Coloration: Skin is not jaundiced or pale.     Findings: No bruising.  Neurological:     General: No focal deficit present.     Mental Status: He is alert and oriented to person, place, and time.     Cranial Nerves: No cranial nerve deficit.     Gait: Gait abnormal.  Psychiatric:        Mood and Affect: Mood normal.        Behavior: Behavior normal.        Thought Content: Thought content normal.        Judgment: Judgment normal.     LABORATORY DATA: I have personally reviewed the data as listed:  No visits with results within 1 Month(s) from this visit.  Latest known visit with results is:  Orders Only on 07/10/2022  Component Date Value Ref Range Status   Ferritin 07/10/2022 20 (L)  24 - 336 ng/mL Final   Performed at [facility], [address], [place], [place] [address]   Folate 07/10/2022 37.8  >5.9 ng/mL Final   Comment: RESULT CONFIRMED BY MANUAL DILUTION Performed at [facility], [address], [place], [place] [address]    Vitamin B-12 07/10/2022 3,948 (H)  180 - 914 pg/mL Final   Comment: RESULT CONFIRMED BY MANUAL DILUTION (NOTE) This assay is not validated for testing neonatal or myeloproliferative syndrome specimens for Vitamin B12 levels. Performed at [facility], [address], [place], [place] [address]     RADIOGRAPHIC STUDIES: I have personally reviewed the radiological images as listed and agree with the findings in the report  No results found.  ASSESSMENT/PLAN  82 y.o. male is here because of prostate cancer.  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  Declined XRT September 07 2021:  PSA 0.1 March 11 2022:  PSA 0.09 July 10 2022- PSA 0.12; slow elevation.  Continue to follow October 09 2022- PSA 0.15 Continue to follow  Anemia:  Etiology likely multifactorial with possible culprits being 1) Iron deficiency from occult GI blood loss 2) CKD 3) Vitamin B12 deficiency March 14 2022- Began parenteral B12 replacement  March 26 2022- Discussed consideration of GI consult.  Patient would like to hold on this for now July 10 2022-  Hgb 12.6 B12 3948 Ferritin 20 Folate 37.8.  Hgb stable.  Can consider IV iron in the future.  B12 and folate adequate  October 09 2022- Hgb 12.4.  Ferritin 20.  If patient to undergo knee replacement then I would have low threshold to give IV iron to minimize risk of transfusion.    Arthritis of knees  October 09 2022- To be evaluate for knee arthroplasty.  It is unlikely that both knees would be operated upon in the same session due to risk of VTE.      Cancer Staging  Prostate cancer [facility]) Staging form: Prostate, AJCC 7th Edition - Clinical: Stage IV (T3b, N1, M0, Gleason 8-10) - Signed by Benjiman Core, MD on 08/01/2020    No problem-specific Assessment & Plan notes found for this encounter.    No orders of the defined types were placed in this encounter.   30  minutes was spent in patient care.  This included time spent preparing to see the patient (e.g., review of tests), obtaining and/or reviewing separately obtained history, counseling and educating the patient/family/caregiver, ordering medications, tests, or procedures; documenting clinical information in the electronic or other health record, independently interpreting results and communicating results to the patient/family/caregiver as well as coordination of care.       All questions were answered. The patient  knows to call the clinic with any problems, questions or concerns.  This note was electronically signed.    Loni Muse, MD  10/08/2022 10:12 AM

## 2022-10-09 ENCOUNTER — Inpatient Hospital Stay: Payer: Medicare HMO | Attending: Oncology | Admitting: Oncology

## 2022-10-09 ENCOUNTER — Inpatient Hospital Stay: Payer: Medicare HMO | Attending: Oncology

## 2022-10-09 VITALS — BP 123/82 | HR 86 | Temp 98.8°F | Resp 14 | Ht 66.0 in | Wt 156.3 lb

## 2022-10-09 DIAGNOSIS — M170 Bilateral primary osteoarthritis of knee: Secondary | ICD-10-CM

## 2022-10-09 DIAGNOSIS — E538 Deficiency of other specified B group vitamins: Secondary | ICD-10-CM

## 2022-10-09 DIAGNOSIS — D500 Iron deficiency anemia secondary to blood loss (chronic): Secondary | ICD-10-CM | POA: Diagnosis not present

## 2022-10-09 DIAGNOSIS — D649 Anemia, unspecified: Secondary | ICD-10-CM | POA: Diagnosis not present

## 2022-10-09 DIAGNOSIS — Z87891 Personal history of nicotine dependence: Secondary | ICD-10-CM | POA: Insufficient documentation

## 2022-10-09 DIAGNOSIS — D509 Iron deficiency anemia, unspecified: Secondary | ICD-10-CM | POA: Diagnosis not present

## 2022-10-09 DIAGNOSIS — Z9079 Acquired absence of other genital organ(s): Secondary | ICD-10-CM | POA: Diagnosis not present

## 2022-10-09 DIAGNOSIS — M069 Rheumatoid arthritis, unspecified: Secondary | ICD-10-CM | POA: Diagnosis not present

## 2022-10-09 DIAGNOSIS — C61 Malignant neoplasm of prostate: Secondary | ICD-10-CM | POA: Diagnosis not present

## 2022-10-09 LAB — CBC WITH DIFFERENTIAL/PLATELET
Abs Immature Granulocytes: 0.02 10*3/uL (ref 0.00–0.07)
Basophils Absolute: 0.1 10*3/uL (ref 0.0–0.1)
Basophils Relative: 1 %
Eosinophils Absolute: 0.6 10*3/uL — ABNORMAL HIGH (ref 0.0–0.5)
Eosinophils Relative: 9 %
HCT: 40 % (ref 39.0–52.0)
Hemoglobin: 12.4 g/dL — ABNORMAL LOW (ref 13.0–17.0)
Immature Granulocytes: 0 %
Lymphocytes Relative: 22 %
Lymphs Abs: 1.4 10*3/uL (ref 0.7–4.0)
MCH: 29 pg (ref 26.0–34.0)
MCHC: 31 g/dL (ref 30.0–36.0)
MCV: 93.7 fL (ref 80.0–100.0)
Monocytes Absolute: 0.6 10*3/uL (ref 0.1–1.0)
Monocytes Relative: 10 %
Neutro Abs: 3.5 10*3/uL (ref 1.7–7.7)
Neutrophils Relative %: 58 %
Platelets: 245 10*3/uL (ref 150–400)
RBC: 4.27 MIL/uL (ref 4.22–5.81)
RDW: 13.4 % (ref 11.5–15.5)
WBC: 6.1 10*3/uL (ref 4.0–10.5)
nRBC: 0 % (ref 0.0–0.2)

## 2022-10-09 LAB — COMPREHENSIVE METABOLIC PANEL
ALT: 13 U/L (ref 0–44)
AST: 16 U/L (ref 15–41)
Albumin: 3.7 g/dL (ref 3.5–5.0)
Alkaline Phosphatase: 51 U/L (ref 38–126)
Anion gap: 8 (ref 5–15)
BUN: 22 mg/dL (ref 8–23)
CO2: 25 mmol/L (ref 22–32)
Calcium: 8.9 mg/dL (ref 8.9–10.3)
Chloride: 106 mmol/L (ref 98–111)
Creatinine, Ser: 1.45 mg/dL — ABNORMAL HIGH (ref 0.61–1.24)
GFR, Estimated: 49 mL/min — ABNORMAL LOW (ref >60)
Glucose, Bld: 84 mg/dL (ref 70–99)
Potassium: 4.3 mmol/L (ref 3.5–5.1)
Sodium: 139 mmol/L (ref 135–145)
Total Bilirubin: 0.4 mg/dL (ref 0.3–1.2)
Total Protein: 6.3 g/dL — ABNORMAL LOW (ref 6.5–8.1)

## 2022-10-09 LAB — FOLATE: Folate: 18.5 ng/mL (ref >5.9)

## 2022-10-09 LAB — FERRITIN: Ferritin: 20 ng/mL — ABNORMAL LOW (ref 24–336)

## 2022-10-09 LAB — VITAMIN B12: Vitamin B-12: 2498 pg/mL — ABNORMAL HIGH (ref 180–914)

## 2022-10-09 LAB — PSA: Prostatic Specific Antigen: 0.15 ng/mL (ref 0.00–4.00)

## 2022-10-10 ENCOUNTER — Ambulatory Visit: Payer: Medicare HMO | Admitting: Oncology

## 2022-10-10 ENCOUNTER — Other Ambulatory Visit: Payer: Medicare HMO

## 2022-10-11 DIAGNOSIS — M25562 Pain in left knee: Secondary | ICD-10-CM | POA: Diagnosis not present

## 2022-10-11 DIAGNOSIS — M25561 Pain in right knee: Secondary | ICD-10-CM | POA: Diagnosis not present

## 2022-10-14 ENCOUNTER — Encounter: Payer: Self-pay | Admitting: Oncology

## 2022-10-14 DIAGNOSIS — D509 Iron deficiency anemia, unspecified: Secondary | ICD-10-CM | POA: Insufficient documentation

## 2022-10-15 ENCOUNTER — Telehealth: Payer: Self-pay | Admitting: Oncology

## 2022-10-15 DIAGNOSIS — M25562 Pain in left knee: Secondary | ICD-10-CM | POA: Diagnosis not present

## 2022-10-15 DIAGNOSIS — M25561 Pain in right knee: Secondary | ICD-10-CM | POA: Diagnosis not present

## 2022-10-15 NOTE — Telephone Encounter (Signed)
10/15/22 Spoke with patient and confirmed iron appts.

## 2022-10-16 DIAGNOSIS — I5022 Chronic systolic (congestive) heart failure: Secondary | ICD-10-CM | POA: Diagnosis not present

## 2022-10-16 DIAGNOSIS — I420 Dilated cardiomyopathy: Secondary | ICD-10-CM | POA: Diagnosis not present

## 2022-10-16 DIAGNOSIS — I255 Ischemic cardiomyopathy: Secondary | ICD-10-CM | POA: Diagnosis not present

## 2022-10-16 DIAGNOSIS — N1831 Chronic kidney disease, stage 3a: Secondary | ICD-10-CM | POA: Diagnosis not present

## 2022-10-16 DIAGNOSIS — M1711 Unilateral primary osteoarthritis, right knee: Secondary | ICD-10-CM | POA: Diagnosis not present

## 2022-10-16 DIAGNOSIS — I110 Hypertensive heart disease with heart failure: Secondary | ICD-10-CM | POA: Diagnosis not present

## 2022-10-16 DIAGNOSIS — C61 Malignant neoplasm of prostate: Secondary | ICD-10-CM | POA: Diagnosis not present

## 2022-10-16 DIAGNOSIS — M47819 Spondylosis without myelopathy or radiculopathy, site unspecified: Secondary | ICD-10-CM | POA: Diagnosis not present

## 2022-10-16 DIAGNOSIS — I25119 Atherosclerotic heart disease of native coronary artery with unspecified angina pectoris: Secondary | ICD-10-CM | POA: Diagnosis not present

## 2022-10-24 ENCOUNTER — Encounter: Payer: Self-pay | Admitting: Oncology

## 2022-10-24 DIAGNOSIS — M170 Bilateral primary osteoarthritis of knee: Secondary | ICD-10-CM | POA: Insufficient documentation

## 2022-10-28 ENCOUNTER — Encounter: Payer: Self-pay | Admitting: Oncology

## 2022-10-28 ENCOUNTER — Inpatient Hospital Stay: Payer: Medicare HMO

## 2022-10-28 VITALS — BP 96/58 | HR 62 | Temp 98.5°F | Resp 18

## 2022-10-28 DIAGNOSIS — C61 Malignant neoplasm of prostate: Secondary | ICD-10-CM | POA: Diagnosis not present

## 2022-10-28 DIAGNOSIS — E538 Deficiency of other specified B group vitamins: Secondary | ICD-10-CM | POA: Diagnosis not present

## 2022-10-28 DIAGNOSIS — D509 Iron deficiency anemia, unspecified: Secondary | ICD-10-CM

## 2022-10-28 DIAGNOSIS — Z87891 Personal history of nicotine dependence: Secondary | ICD-10-CM | POA: Diagnosis not present

## 2022-10-28 MED ORDER — ACETAMINOPHEN 325 MG PO TABS
650.0000 mg | ORAL_TABLET | Freq: Once | ORAL | Status: AC
  Administered 2022-10-28: 650 mg via ORAL
  Filled 2022-10-28: qty 2

## 2022-10-28 MED ORDER — SODIUM CHLORIDE 0.9 % IV SOLN: Freq: Once | INTRAVENOUS | Status: AC

## 2022-10-28 MED ORDER — SODIUM CHLORIDE 0.9 % IV SOLN
200.0000 mg | Freq: Once | INTRAVENOUS | Status: AC
  Administered 2022-10-28: 200 mg via INTRAVENOUS
  Filled 2022-10-28: qty 200

## 2022-10-28 MED ORDER — LORATADINE 10 MG PO TABS
10.0000 mg | ORAL_TABLET | Freq: Once | ORAL | Status: AC
  Administered 2022-10-28: 10 mg via ORAL
  Filled 2022-10-28: qty 1

## 2022-10-28 NOTE — Patient Instructions (Signed)
Iron Sucrose Injection What is this medication? IRON SUCROSE (EYE ern SOO krose) treats low levels of iron (iron deficiency anemia) in people with kidney disease. Iron is a mineral that plays an important role in making red blood cells, which carry oxygen from your lungs to the rest of your body. This medicine may be used for other purposes; ask your health care provider or pharmacist if you have questions. COMMON BRAND NAME(S): Venofer What should I tell my care team before I take this medication? They need to know if you have any of these conditions: Anemia not caused by low iron levels Heart disease High levels of iron in the blood Kidney disease Liver disease An unusual or allergic reaction to iron, other medications, foods, dyes, or preservatives Pregnant or trying to get pregnant Breastfeeding How should I use this medication? This medication is for infusion into a vein. It is given in a hospital or clinic setting. Talk to your care team about the use of this medication in children. While this medication may be prescribed for children as young as 2 years for selected conditions, precautions do apply. Overdosage: If you think you have taken too much of this medicine contact a poison control center or emergency room at once. NOTE: This medicine is only for you. Do not share this medicine with others. What if I miss a dose? Keep appointments for follow-up doses. It is important not to miss your dose. Call your care team if you are unable to keep an appointment. What may interact with this medication? Do not take this medication with any of the following: Deferoxamine Dimercaprol Other iron products This medication may also interact with the following: Chloramphenicol Deferasirox This list may not describe all possible interactions. Give your health care provider a list of all the medicines, herbs, non-prescription drugs, or dietary supplements you use. Also tell them if you smoke,  drink alcohol, or use illegal drugs. Some items may interact with your medicine. What should I watch for while using this medication? Visit your care team regularly. Tell your care team if your symptoms do not start to get better or if they get worse. You may need blood work done while you are taking this medication. You may need to follow a special diet. Talk to your care team. Foods that contain iron include: whole grains/cereals, dried fruits, beans, or peas, leafy green vegetables, and organ meats (liver, kidney). What side effects may I notice from receiving this medication? Side effects that you should report to your care team as soon as possible: Allergic reactions--skin rash, itching, hives, swelling of the face, lips, tongue, or throat Low blood pressure--dizziness, feeling faint or lightheaded, blurry vision Shortness of breath Side effects that usually do not require medical attention (report to your care team if they continue or are bothersome): Flushing Headache Joint pain Muscle pain Nausea Pain, redness, or irritation at injection site This list may not describe all possible side effects. Call your doctor for medical advice about side effects. You may report side effects to FDA at 1-800-FDA-1088. Where should I keep my medication? This medication is given in a hospital or clinic. It will not be stored at home. NOTE: This sheet is a summary. It may not cover all possible information. If you have questions about this medicine, talk to your doctor, pharmacist, or health care provider.  2024 Elsevier/Gold Standard (2022-05-31 00:00:00)

## 2022-10-29 ENCOUNTER — Inpatient Hospital Stay: Payer: Medicare HMO

## 2022-10-29 ENCOUNTER — Encounter: Payer: Self-pay | Admitting: Oncology

## 2022-10-29 VITALS — BP 119/67 | HR 75 | Temp 98.0°F | Resp 18 | Ht 66.0 in | Wt 156.0 lb

## 2022-10-29 DIAGNOSIS — C61 Malignant neoplasm of prostate: Secondary | ICD-10-CM | POA: Diagnosis not present

## 2022-10-29 DIAGNOSIS — Z87891 Personal history of nicotine dependence: Secondary | ICD-10-CM | POA: Diagnosis not present

## 2022-10-29 DIAGNOSIS — D509 Iron deficiency anemia, unspecified: Secondary | ICD-10-CM | POA: Diagnosis not present

## 2022-10-29 DIAGNOSIS — E538 Deficiency of other specified B group vitamins: Secondary | ICD-10-CM | POA: Diagnosis not present

## 2022-10-29 MED ORDER — LORATADINE 10 MG PO TABS
10.0000 mg | ORAL_TABLET | Freq: Once | ORAL | Status: AC
  Administered 2022-10-29: 10 mg via ORAL
  Filled 2022-10-29: qty 1

## 2022-10-29 MED ORDER — ACETAMINOPHEN 325 MG PO TABS
650.0000 mg | ORAL_TABLET | Freq: Once | ORAL | Status: AC
  Administered 2022-10-29: 650 mg via ORAL
  Filled 2022-10-29: qty 2

## 2022-10-29 MED ORDER — IRON SUCROSE 20 MG/ML IV SOLN
200.0000 mg | Freq: Once | INTRAVENOUS | Status: AC
  Administered 2022-10-29: 200 mg via INTRAVENOUS

## 2022-10-29 MED ORDER — SODIUM CHLORIDE 0.9% FLUSH
10.0000 mL | Freq: Once | INTRAVENOUS | Status: AC | PRN
  Administered 2022-10-29: 10 mL

## 2022-10-29 MED ORDER — SODIUM CHLORIDE 0.9 % IV SOLN: Freq: Once | INTRAVENOUS | Status: DC

## 2022-10-29 NOTE — Patient Instructions (Signed)
Iron Sucrose Injection What is this medication? IRON SUCROSE (EYE ern SOO krose) treats low levels of iron (iron deficiency anemia) in people with kidney disease. Iron is a mineral that plays an important role in making red blood cells, which carry oxygen from your lungs to the rest of your body. This medicine may be used for other purposes; ask your health care provider or pharmacist if you have questions. COMMON BRAND NAME(S): Venofer What should I tell my care team before I take this medication? They need to know if you have any of these conditions: Anemia not caused by low iron levels Heart disease High levels of iron in the blood Kidney disease Liver disease An unusual or allergic reaction to iron, other medications, foods, dyes, or preservatives Pregnant or trying to get pregnant Breastfeeding How should I use this medication? This medication is for infusion into a vein. It is given in a hospital or clinic setting. Talk to your care team about the use of this medication in children. While this medication may be prescribed for children as young as 2 years for selected conditions, precautions do apply. Overdosage: If you think you have taken too much of this medicine contact a poison control center or emergency room at once. NOTE: This medicine is only for you. Do not share this medicine with others. What if I miss a dose? Keep appointments for follow-up doses. It is important not to miss your dose. Call your care team if you are unable to keep an appointment. What may interact with this medication? Do not take this medication with any of the following: Deferoxamine Dimercaprol Other iron products This medication may also interact with the following: Chloramphenicol Deferasirox This list may not describe all possible interactions. Give your health care provider a list of all the medicines, herbs, non-prescription drugs, or dietary supplements you use. Also tell them if you smoke,  drink alcohol, or use illegal drugs. Some items may interact with your medicine. What should I watch for while using this medication? Visit your care team regularly. Tell your care team if your symptoms do not start to get better or if they get worse. You may need blood work done while you are taking this medication. You may need to follow a special diet. Talk to your care team. Foods that contain iron include: whole grains/cereals, dried fruits, beans, or peas, leafy green vegetables, and organ meats (liver, kidney). What side effects may I notice from receiving this medication? Side effects that you should report to your care team as soon as possible: Allergic reactions--skin rash, itching, hives, swelling of the face, lips, tongue, or throat Low blood pressure--dizziness, feeling faint or lightheaded, blurry vision Shortness of breath Side effects that usually do not require medical attention (report to your care team if they continue or are bothersome): Flushing Headache Joint pain Muscle pain Nausea Pain, redness, or irritation at injection site This list may not describe all possible side effects. Call your doctor for medical advice about side effects. You may report side effects to FDA at 1-800-FDA-1088. Where should I keep my medication? This medication is given in a hospital or clinic. It will not be stored at home. NOTE: This sheet is a summary. It may not cover all possible information. If you have questions about this medicine, talk to your doctor, pharmacist, or health care provider.  2024 Elsevier/Gold Standard (2022-05-31 00:00:00)

## 2022-10-31 ENCOUNTER — Inpatient Hospital Stay: Payer: Medicare HMO

## 2022-10-31 VITALS — BP 103/63 | HR 76 | Temp 98.2°F | Resp 18

## 2022-10-31 DIAGNOSIS — D509 Iron deficiency anemia, unspecified: Secondary | ICD-10-CM

## 2022-10-31 DIAGNOSIS — E538 Deficiency of other specified B group vitamins: Secondary | ICD-10-CM | POA: Diagnosis not present

## 2022-10-31 DIAGNOSIS — Z87891 Personal history of nicotine dependence: Secondary | ICD-10-CM | POA: Diagnosis not present

## 2022-10-31 DIAGNOSIS — C61 Malignant neoplasm of prostate: Secondary | ICD-10-CM | POA: Diagnosis not present

## 2022-10-31 MED ORDER — LORATADINE 10 MG PO TABS
10.0000 mg | ORAL_TABLET | Freq: Once | ORAL | Status: AC
  Administered 2022-10-31: 10 mg via ORAL
  Filled 2022-10-31: qty 1

## 2022-10-31 MED ORDER — ACETAMINOPHEN 325 MG PO TABS
650.0000 mg | ORAL_TABLET | Freq: Once | ORAL | Status: AC
  Administered 2022-10-31: 650 mg via ORAL
  Filled 2022-10-31: qty 2

## 2022-10-31 MED ORDER — SODIUM CHLORIDE 0.9 % IV SOLN: Freq: Once | INTRAVENOUS | Status: AC

## 2022-10-31 MED ORDER — IRON SUCROSE 20 MG/ML IV SOLN
200.0000 mg | Freq: Once | INTRAVENOUS | Status: AC
  Administered 2022-10-31: 200 mg via INTRAVENOUS

## 2022-10-31 NOTE — Patient Instructions (Signed)
Iron Sucrose Injection What is this medication? IRON SUCROSE (EYE ern SOO krose) treats low levels of iron (iron deficiency anemia) in people with kidney disease. Iron is a mineral that plays an important role in making red blood cells, which carry oxygen from your lungs to the rest of your body. This medicine may be used for other purposes; ask your health care provider or pharmacist if you have questions. COMMON BRAND NAME(S): Venofer What should I tell my care team before I take this medication? They need to know if you have any of these conditions: Anemia not caused by low iron levels Heart disease High levels of iron in the blood Kidney disease Liver disease An unusual or allergic reaction to iron, other medications, foods, dyes, or preservatives Pregnant or trying to get pregnant Breastfeeding How should I use this medication? This medication is for infusion into a vein. It is given in a hospital or clinic setting. Talk to your care team about the use of this medication in children. While this medication may be prescribed for children as young as 2 years for selected conditions, precautions do apply. Overdosage: If you think you have taken too much of this medicine contact a poison control center or emergency room at once. NOTE: This medicine is only for you. Do not share this medicine with others. What if I miss a dose? Keep appointments for follow-up doses. It is important not to miss your dose. Call your care team if you are unable to keep an appointment. What may interact with this medication? Do not take this medication with any of the following: Deferoxamine Dimercaprol Other iron products This medication may also interact with the following: Chloramphenicol Deferasirox This list may not describe all possible interactions. Give your health care provider a list of all the medicines, herbs, non-prescription drugs, or dietary supplements you use. Also tell them if you smoke,  drink alcohol, or use illegal drugs. Some items may interact with your medicine. What should I watch for while using this medication? Visit your care team regularly. Tell your care team if your symptoms do not start to get better or if they get worse. You may need blood work done while you are taking this medication. You may need to follow a special diet. Talk to your care team. Foods that contain iron include: whole grains/cereals, dried fruits, beans, or peas, leafy green vegetables, and organ meats (liver, kidney). What side effects may I notice from receiving this medication? Side effects that you should report to your care team as soon as possible: Allergic reactions--skin rash, itching, hives, swelling of the face, lips, tongue, or throat Low blood pressure--dizziness, feeling faint or lightheaded, blurry vision Shortness of breath Side effects that usually do not require medical attention (report to your care team if they continue or are bothersome): Flushing Headache Joint pain Muscle pain Nausea Pain, redness, or irritation at injection site This list may not describe all possible side effects. Call your doctor for medical advice about side effects. You may report side effects to FDA at 1-800-FDA-1088. Where should I keep my medication? This medication is given in a hospital or clinic. It will not be stored at home. NOTE: This sheet is a summary. It may not cover all possible information. If you have questions about this medicine, talk to your doctor, pharmacist, or health care provider.  2024 Elsevier/Gold Standard (2022-05-31 00:00:00)

## 2022-11-01 ENCOUNTER — Encounter: Payer: Self-pay | Admitting: Oncology

## 2022-11-04 ENCOUNTER — Ambulatory Visit: Payer: Medicare HMO

## 2022-11-06 ENCOUNTER — Inpatient Hospital Stay: Payer: Medicare HMO | Admitting: Oncology

## 2022-11-06 ENCOUNTER — Inpatient Hospital Stay: Payer: Medicare HMO

## 2022-11-06 VITALS — BP 124/85 | HR 85 | Temp 98.2°F | Resp 14 | Ht 66.0 in | Wt 156.9 lb

## 2022-11-06 VITALS — BP 93/59 | HR 62 | Temp 98.1°F | Resp 16

## 2022-11-06 DIAGNOSIS — M170 Bilateral primary osteoarthritis of knee: Secondary | ICD-10-CM

## 2022-11-06 DIAGNOSIS — D500 Iron deficiency anemia secondary to blood loss (chronic): Secondary | ICD-10-CM | POA: Diagnosis not present

## 2022-11-06 DIAGNOSIS — Z87891 Personal history of nicotine dependence: Secondary | ICD-10-CM | POA: Diagnosis not present

## 2022-11-06 DIAGNOSIS — D509 Iron deficiency anemia, unspecified: Secondary | ICD-10-CM | POA: Diagnosis not present

## 2022-11-06 DIAGNOSIS — Z4502 Encounter for adjustment and management of automatic implantable cardiac defibrillator: Secondary | ICD-10-CM | POA: Diagnosis not present

## 2022-11-06 DIAGNOSIS — C61 Malignant neoplasm of prostate: Secondary | ICD-10-CM | POA: Diagnosis not present

## 2022-11-06 DIAGNOSIS — E538 Deficiency of other specified B group vitamins: Secondary | ICD-10-CM | POA: Diagnosis not present

## 2022-11-06 MED ORDER — ACETAMINOPHEN 325 MG PO TABS
650.0000 mg | ORAL_TABLET | Freq: Once | ORAL | Status: AC
  Administered 2022-11-06: 650 mg via ORAL
  Filled 2022-11-06: qty 2

## 2022-11-06 MED ORDER — LORATADINE 10 MG PO TABS
10.0000 mg | ORAL_TABLET | Freq: Once | ORAL | Status: AC
  Administered 2022-11-06: 10 mg via ORAL
  Filled 2022-11-06: qty 1

## 2022-11-06 MED ORDER — IRON SUCROSE 20 MG/ML IV SOLN
200.0000 mg | Freq: Once | INTRAVENOUS | Status: AC
  Administered 2022-11-06: 200 mg via INTRAVENOUS
  Filled 2022-11-06: qty 10

## 2022-11-06 NOTE — Patient Instructions (Signed)
Iron Sucrose Injection What is this medication? IRON SUCROSE (EYE ern SOO krose) treats low levels of iron (iron deficiency anemia) in people with kidney disease. Iron is a mineral that plays an important role in making red blood cells, which carry oxygen from your lungs to the rest of your body. This medicine may be used for other purposes; ask your health care provider or pharmacist if you have questions. COMMON BRAND NAME(S): Venofer What should I tell my care team before I take this medication? They need to know if you have any of these conditions: Anemia not caused by low iron levels Heart disease High levels of iron in the blood Kidney disease Liver disease An unusual or allergic reaction to iron, other medications, foods, dyes, or preservatives Pregnant or trying to get pregnant Breastfeeding How should I use this medication? This medication is for infusion into a vein. It is given in a hospital or clinic setting. Talk to your care team about the use of this medication in children. While this medication may be prescribed for children as young as 2 years for selected conditions, precautions do apply. Overdosage: If you think you have taken too much of this medicine contact a poison control center or emergency room at once. NOTE: This medicine is only for you. Do not share this medicine with others. What if I miss a dose? Keep appointments for follow-up doses. It is important not to miss your dose. Call your care team if you are unable to keep an appointment. What may interact with this medication? Do not take this medication with any of the following: Deferoxamine Dimercaprol Other iron products This medication may also interact with the following: Chloramphenicol Deferasirox This list may not describe all possible interactions. Give your health care provider a list of all the medicines, herbs, non-prescription drugs, or dietary supplements you use. Also tell them if you smoke,  drink alcohol, or use illegal drugs. Some items may interact with your medicine. What should I watch for while using this medication? Visit your care team regularly. Tell your care team if your symptoms do not start to get better or if they get worse. You may need blood work done while you are taking this medication. You may need to follow a special diet. Talk to your care team. Foods that contain iron include: whole grains/cereals, dried fruits, beans, or peas, leafy green vegetables, and organ meats (liver, kidney). What side effects may I notice from receiving this medication? Side effects that you should report to your care team as soon as possible: Allergic reactions--skin rash, itching, hives, swelling of the face, lips, tongue, or throat Low blood pressure--dizziness, feeling faint or lightheaded, blurry vision Shortness of breath Side effects that usually do not require medical attention (report to your care team if they continue or are bothersome): Flushing Headache Joint pain Muscle pain Nausea Pain, redness, or irritation at injection site This list may not describe all possible side effects. Call your doctor for medical advice about side effects. You may report side effects to FDA at 1-800-FDA-1088. Where should I keep my medication? This medication is given in a hospital or clinic. It will not be stored at home. NOTE: This sheet is a summary. It may not cover all possible information. If you have questions about this medicine, talk to your doctor, pharmacist, or health care provider.  2024 Elsevier/Gold Standard (2022-05-31 00:00:00)

## 2022-11-06 NOTE — Progress Notes (Signed)
[facility] Cancer Follow up  Visit:  Patient Care Team: Street, Stephanie Coup, MD as PCP - General (Family Medicine)  CHIEF COMPLAINTS/PURPOSE OF CONSULTATION:  Oncology History  Prostate cancer [facility])  08/2014 Tumor Marker   PSA 10.1   11/01/2014 Tumor Marker   PSA 12.22   12/20/2014 Procedure   Prostate biopsy. 10/12 cores (+) for prostate adenoca. Gleason 9 (5+4) disease noted in 4 cores. PNI+. Gleason 9 (4+5) noted in 2 cores. PNI+.  Gleason 8 (5+3) in 1 core and Gleason 8 (4+4) in 1 core. Gleason 7 (4+3) in 2 cores. PNI+.    12/20/2014 Initial Diagnosis   Prostate cancer (HCC)   01/16/2015 Imaging   Bone scan: 2 punctate areas of increased activity noted about left face/skull. 1 punctate area of increased activity noted about right occipital skull. Maxillofacial and head CT with attention to bone windows suggested for further eval.   08/01/2020 Cancer Staging   Staging form: Prostate, AJCC 7th Edition - Clinical: Stage IV (T3b, N1, M0, Gleason 8-10) - Signed by Benjiman Core, MD on 08/01/2020     HISTORY OF PRESENTING ILLNESS: Jesse Mann 82 y.o. male is here because of prostate cancer Medical history notable for ischemic cardiomyopathy, cardiac arrhythmia requiring AICD placement, sleep apnea, MI, rheumatoid arthritis  December 20, 2014 diagnosed with Gleason 9 (5+4 disease with perineural invasion) January 26, 2015: Bone scan showed 2 punctate areas of increased activity about the left face/skull and a punctate area of increased activity about the right occipital bone March 03, 2015 radical prostatectomy.  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  He declined radiation October 29, 2017 with last dose of Lupron   September 07, 2021: WBC  6.9 hemoglobin 12.3 platelet count 184; 63 segs 18 lymphs 10 monos 8 eos creatinine 1.3 PSA 0.1  January 14 2022:  L2-3 laminectomy and fusion with pedicle screws and TLIF, allograft and bone marrow aspiration, O-arm   March 11 2022:  About 5 weeks ago underwent spine surgery.  Last week AICD went off; he was subsequently admitted for evaluation which included a cardiac cath.    WBC 5.3 hemoglobin 12.5 platelet count 254; 49 segs 26 lymphs 11 monos 13 eos 1 basophil.  Reticulocytes 1.1%. Coombs test negative haptoglobin 163 Ferritin 21 folate 16.9 B12 170 PSA 0.09 CMP notable for creatinine 1.24 BUN 27  March 06 2022:  Cardiac ECHO- LV is moderately dilated. Mild left ventricular hypertrophy. LV ejection fraction = 35-40%. There is mild to moderate global hypokinesis of the left ventricle.  Left heart cath- Coronary Findings Diagnostic Dominance: Right  Left Main: Dist LM to Ost LAD lesion is 20% stenosed. Left Anterior Descending: Prox LAD to Mid LAD lesion is 10% stenosed. First Diagonal Branch: 1st Diag lesion is 20% stenosed. Left Circumflex: Ost Cx to Prox Cx lesion is 20% stenosed. Prox Cx to Mid Cx lesion is 30% stenosed. Right Coronary Artery: Prox RCA to Mid RCA lesion is 100% stenosed. The lesion is chronically occluded. The lesion was not previously treated.  Intervention- No interventions have been documented.   March 7 through March 22, 2022:  Received 1000 mcg of vitamin B12 x 7 doses   March 26 2022:  Patient on ASA 81 mg daily.  Last colonoscopy was in 2011.  Discussed  referral to GI for consideration of endoscopy.  He is hesitant to consider it because of age.  Will continue to follow Hgb, ferritin levels and if downward trend then rediscuss GI consult.  Will switch from B12 sq to sublingual.  Had not previously been taking B12 regularly.  Fatigued since carvedilol dose increased by cardiology.  (I encouraged him to discuss this with cardiology)  July 10 2022:     Gait  improved with recovery from spine surgery.  Taking B12 sublingual.  Remains on ASA 81 mg daily.  Has gotten used to increased carvedilol dose.  Defibrillator has not gone off again.  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  To see orthopedic surgeon this week.   He was inquiring about having both knees simultaneously.  Informed him that is not likely going to be possible.    Hgb 12.4 Ferritin 20 B12 2498 Folate 18.5   PSA 0.15  Cr 1.45    October 21 through October 31 2022:  Received a total of 600 mg Venofer  November 06 2022:  Scheduled follow up for management of prostate cancer and anemia.  Feels well.  Reviewed results of labs with patient.   Venofer 200 mg  November 07 2022:  Venofer 200 mg   December 11 2022:  Right knee arthroplasty.     Review of Systems  Constitutional:  Negative for chills, fatigue and fever.  HENT:   Negative for mouth sores, nosebleeds, sore throat, tinnitus and trouble swallowing.   Eyes:  Negative for eye problems and icterus.       Vision changes:  None  Respiratory:  Negative for chest tightness, cough, hemoptysis, shortness of breath and wheezing.        PND:  none Orthopnea:  none DOE:    Cardiovascular:  Negative for chest pain, leg swelling and palpitations.       PND:  none Orthopnea:  none  Gastrointestinal:  Negative for abdominal pain, constipation, diarrhea, nausea and vomiting.       Occasional hematochezia attributed to hemorrhoids   Endocrine: Negative for hot flashes.       Cold intolerance:  none Heat intolerance:  none  Genitourinary:  Negative for bladder incontinence, difficulty urinating, dysuria, frequency, hematuria and nocturia.   Musculoskeletal:  Positive for arthralgias, back pain and gait problem. Negative for myalgias.  Skin:  Negative for itching, rash and wound.   Neurological:  Positive for extremity weakness and gait problem. Negative for dizziness, headaches, light-headedness, numbness, seizures and speech difficulty.       Ambulates with a cane  Hematological:  Negative for adenopathy. Does not bruise/bleed easily.  Psychiatric/Behavioral:  Negative for sleep disturbance and suicidal ideas. The patient is not nervous/anxious.     MEDICAL HISTORY: Past Medical History:  Diagnosis Date   AICD (automatic cardioverter/defibrillator) present    Arthritis    fingers- right (2-4 fingers with ROM issues).knee left   Bifascicular block    Cardiac arrest (HCC) [facility]/2016   [facility]   Cardiac arrhythmia    Dr. Revankar,cardiology [phone] [facility])   Cardiac murmur    Cardiomyopathy [facility])    Ischemic   CHF (congestive heart failure) (HCC)    Coronary artery disease    Flail chest    GERD (gastroesophageal reflux disease)  Hemorrhoids    History of tracheostomy    History of ventricular fibrillation 07/22/2016   Hypercholesteremia    Hypertension    Left anterior fascicular block (LAFB)    Myocardial infarction (HCC)    7'14[facility]- "feeling strange" eavaluated, heart cath done with stents x2 placed   Pneumonia    history of   Presence of permanent cardiac pacemaker    Prostate cancer (HCC)    PVC (premature ventricular contraction)    RBBB    Recurrent dry cough    intermittent daily -occ. production   Sleep apnea     SURGICAL HISTORY: Past Surgical History:  Procedure Laterality Date   BACK SURGERY     With Rods and screws   CARDIAC CATHETERIZATION     '14- stents placed x2 only.   CARDIAC CATHETERIZATION     COLONOSCOPY  2011   CORONARY ANGIOPLASTY     CYSTOSCOPY W/ URETERAL STENT PLACEMENT Bilateral 04/16/2017   Procedure: CYSTOSCOPY WITH CATHETER  REPLACEMENT FIREFLY;  Surgeon: Crist Fat, MD;  Location: [facility];  Service: Urology;   Laterality: Bilateral;   HAND SURGERY     HERNIA REPAIR  1997 and 1995   inguinal (x2 on one side)   ICD IMPLANT     INGUINAL HERNIA REPAIR     INSERT / REPLACE / REMOVE PACEMAKER     LYMPH NODE DISSECTION Bilateral 03/03/2015   Procedure: BILATERAL PELVIC LYMPH NODE DISSECTION;  Surgeon: Crist Fat, MD;  Location: [facility];  Service: Urology;  Laterality: Bilateral;   PROSTATE BIOPSY     REPAIR EXTENSOR TENDON HAND Right    (3-4 fingers) '65- x3-4 times"trauma injury   ROBOT ASSISTED LAPAROSCOPIC RADICAL PROSTATECTOMY Bilateral 03/03/2015   Procedure: XI ROBOTIC ASSISTED LAPAROSCOPIC RADICAL PROSTATECTOMY;  Surgeon: Crist Fat, MD;  Location: [facility];  Service: Urology;  Laterality: Bilateral;   Surgery to correct flail chest after CPR     TONSILLECTOMY     TRACHEOSTOMY      SOCIAL HISTORY: Social History   Socioeconomic History   Marital status: Married    Spouse name: Not on file   Number of children: 3   Years of education: Not on file   Highest education level: Not on file  Occupational History   Occupation: Retired  Tobacco Use   Smoking status: Former    Current packs/day: 0.00    Average packs/day: 1 pack/day for 10.0 years (10.0 ttl pk-yrs)    Types: Cigarettes    Start date: 01/08/1956    Quit date: 01/07/1966    Years since quitting: 56.8   Smokeless tobacco: Never  Vaping Use   Vaping status: Never Used  Substance and Sexual Activity   Alcohol use: No    Alcohol/week: 0.0 standard drinks of alcohol   Drug use: No   Sexual activity: Never  Other Topics Concern   Not on file  Social History Narrative   Not on file   Social Determinants of Health   Financial Resource Strain: Not on file  Food Insecurity: Low Risk  (03/06/2022)   [facility]   Hunger Vital Sign    Worried About Running Out of Food in the Last Year: Never true    Within the past 12 months, the food you bought just didn't last and you didn't have  money to get more: Not on file  Transportation Needs: No Transportation Needs (03/06/2022)   Received from [facility]  Transportation    In the past 12 months, has lack of reliable transportation kept you from medical appointments, meetings, work or from getting things needed for daily living? : No  Physical Activity: Not on file  Stress: Not on file  Social Connections: Not on file  Intimate Partner Violence: Low Risk  (03/06/2022)   [facility] from [facility] visits prior to 03/09/2022., [facility] visits prior to 03/09/2022.   Safety    How often does anyone, including family and friends, physically hurt you?: Never    How often does anyone, including family and friends, insult or talk down to you?: Never    How often does anyone, including family and friends, threaten you with harm?: Never    How often does anyone, including family and friends, scream or curse at you?: Never    FAMILY HISTORY Family History  Problem Relation Age of Onset   Heart failure Mother    Cancer Father        colon   Cancer Paternal Grandmother        type unknown    ALLERGIES:  has No Known Allergies.  MEDICATIONS:  Current Outpatient Medications  Medication Sig Dispense Refill   acetaminophen (TYLENOL) 500 MG tablet Take 500 mg by mouth every 6 (six) hours as needed (for pain.).     amiodarone (PACERONE) 200 MG tablet Take 200 mg by mouth daily. 0.5 tab (100 mg total) po daily     aspirin 81 MG tablet Take 81 mg by mouth daily.     carvedilol (COREG) 12.5 MG tablet Take 6.25 mg by mouth 2 (two) times daily with a meal.      cyanocobalamin (VITAMIN B12) 1000 MCG tablet Take 1 tablet by mouth daily.     famotidine (PEPCID) 40 MG tablet Take 40 mg at bedtime by mouth.     FARXIGA 10 MG TABS tablet Take 10 mg by mouth daily.     magnesium oxide (MAG-OX) 400 (240 Mg) MG tablet Take 1 tablet by mouth daily.     nitroGLYCERIN (NITROSTAT) 0.4 MG SL  tablet Place 0.4 mg under the tongue every 5 (five) minutes as needed for chest pain.      ranolazine (RANEXA) 500 MG 12 hr tablet Take 500 mg by mouth 2 (two) times daily.     rosuvastatin (CRESTOR) 10 MG tablet Take 1 tablet by mouth daily.     No current facility-administered medications for this visit.    PHYSICAL EXAMINATION:  ECOG PERFORMANCE STATUS: 1 - Symptomatic but completely ambulatory   Vitals:   11/06/22 1029  BP: 124/85  Pulse: 85  Resp: 14  Temp: 98.2 F (36.8 C)  SpO2: 99%     Filed Weights   11/06/22 1029  Weight: 156 lb 14.4 oz (71.2 kg)      Physical Exam Vitals and nursing note reviewed.  Constitutional:      Appearance: Normal appearance. He is not diaphoretic.     Comments: Here alone  HENT:     Head: Normocephalic and atraumatic.     Right Ear: External ear normal.     Left Ear: External ear normal.     Nose: Nose normal.  Eyes:     Conjunctiva/sclera: Conjunctivae normal.     Pupils: Pupils are equal, round, and reactive to light.  Cardiovascular:     Rate and Rhythm: Regular rhythm. Tachycardia present.     Heart sounds: Normal heart sounds.  No friction rub. No gallop.  Pulmonary:     Effort: Pulmonary effort is normal. No respiratory distress.     Breath sounds: Normal breath sounds. No stridor. No wheezing or rhonchi.  Abdominal:     Palpations: Abdomen is soft.     Tenderness: There is no abdominal tenderness. There is no guarding or rebound.  Musculoskeletal:        General: Deformity present.     Cervical back: Normal range of motion and neck supple. No rigidity or tenderness.     Right lower leg: No edema.     Left lower leg: No edema.     Comments: Deformity of hands secondary RA.  Ambulates with a cane  Lymphadenopathy:     Head:     Right side of head: No submental, submandibular, tonsillar, preauricular, posterior auricular or occipital adenopathy.     Left side of head: No submental, submandibular, tonsillar,  preauricular, posterior auricular or occipital adenopathy.     Cervical: No cervical adenopathy.     Right cervical: No superficial, deep or posterior cervical adenopathy.    Left cervical: No superficial, deep or posterior cervical adenopathy.     Upper Body:     Right upper body: No supraclavicular or axillary adenopathy.     Left upper body: No supraclavicular or axillary adenopathy.     Lower Body: No right inguinal adenopathy. No left inguinal adenopathy.  Skin:    Coloration: Skin is not jaundiced or pale.     Findings: No bruising.  Neurological:     General: No focal deficit present.     Mental Status: He is alert and oriented to person, place, and time.     Cranial Nerves: No cranial nerve deficit.     Gait: Gait abnormal.  Psychiatric:        Mood and Affect: Mood normal.        Behavior: Behavior normal.        Thought Content: Thought content normal.        Judgment: Judgment normal.     LABORATORY DATA: I have personally reviewed the data as listed:  Appointment on 10/09/2022  Component Date Value Ref Range Status   Prostatic Specific Antigen 10/09/2022 0.15  0.00 - 4.00 ng/mL Final   Comment: (NOTE) While PSA levels of <=4.00 ng/ml are reported as reference range, some men with levels below 4.00 ng/ml can have prostate cancer and many men with PSA above 4.00 ng/ml do not have prostate cancer.  Other tests such as free PSA, age specific reference ranges, PSA velocity and PSA doubling time may be helpful especially in men less than 80 years old. Performed at [facility], [address], [place], [place] [address]    Folate 10/09/2022 18.5  >5.9 ng/mL Final   Performed at [facility], [address] [place], [place] [address]   Vitamin B-12 10/09/2022 2,498 (H)  180 - 914 pg/mL Final   Comment: RESULT CONFIRMED BY MANUAL DILUTION (NOTE) This assay is not validated for testing neonatal or myeloproliferative syndrome  specimens for Vitamin B12 levels. Performed at [facility], [address] [place], [place] [address]    Ferritin 10/09/2022 20 (L)  24 - 336 ng/mL Final   Performed at [facility], [address], [place], [place] [address]   Sodium 10/09/2022 139  135 - 145 mmol/L Final   Potassium 10/09/2022 4.3  3.5 - 5.1 mmol/L Final   Chloride  10/09/2022 106  98 - 111 mmol/L Final   CO2 10/09/2022 25  22 - 32 mmol/L Final   Glucose, Bld 10/09/2022 84  70 - 99 mg/dL Final   Glucose reference range applies only to samples taken after fasting for at least 8 hours.   BUN 10/09/2022 22  8 - 23 mg/dL Final   Creatinine, Ser 10/09/2022 1.45 (H)  0.61 - 1.24 mg/dL Final   Calcium 86/57/8469 8.9  8.9 - 10.3 mg/dL Final   Total Protein 62/95/2841 6.3 (L)  6.5 - 8.1 g/dL Final   Albumin 32/44/0102 3.7  3.5 - 5.0 g/dL Final   AST 72/53/6644 16  15 - 41 U/L Final   ALT 10/09/2022 13  0 - 44 U/L Final   Alkaline Phosphatase 10/09/2022 51  38 - 126 U/L Final   Total Bilirubin 10/09/2022 0.4  0.3 - 1.2 mg/dL Final   GFR, Estimated 10/09/2022 49 (L)  >60 mL/min Final   Comment: (NOTE) Calculated using the CKD-EPI Creatinine Equation (2021)    Anion gap 10/09/2022 8  5 - 15 Final   Performed at [facility], [address] [place], [place] [address]   WBC 10/09/2022 6.1  4.0 - 10.5 K/uL Final   RBC 10/09/2022 4.27  4.22 - 5.81 MIL/uL Final   Hemoglobin 10/09/2022 12.4 (L)  13.0 - 17.0 g/dL Final   HCT 25/95/6387 40.0  39.0 - 52.0 % Final   MCV 10/09/2022 93.7  80.0 - 100.0 fL Final   MCH 10/09/2022 29.0  26.0 - 34.0 pg Final   MCHC 10/09/2022 31.0  30.0 - 36.0 g/dL Final   RDW 56/43/3295 13.4  11.5 - 15.5 % Final   Platelets 10/09/2022 245  150 - 400 K/uL Final   nRBC 10/09/2022 0.0  0.0 - 0.2 % Final   Neutrophils Relative % 10/09/2022 58  % Final   Neutro Abs 10/09/2022 3.5  1.7 - 7.7 K/uL Final   Lymphocytes Relative 10/09/2022 22  % Final    Lymphs Abs 10/09/2022 1.4  0.7 - 4.0 K/uL Final   Monocytes Relative 10/09/2022 10  % Final   Monocytes Absolute 10/09/2022 0.6  0.1 - 1.0 K/uL Final   Eosinophils Relative 10/09/2022 9  % Final   Eosinophils Absolute 10/09/2022 0.6 (H)  0.0 - 0.5 K/uL Final   Basophils Relative 10/09/2022 1  % Final   Basophils Absolute 10/09/2022 0.1  0.0 - 0.1 K/uL Final   Immature Granulocytes 10/09/2022 0  % Final   Abs Immature Granulocytes 10/09/2022 0.02  0.00 - 0.07 K/uL Final   Performed at [facility], [address], [place], [place] [address]    RADIOGRAPHIC STUDIES: I have personally reviewed the radiological images as listed and agree with the findings in the report  No results found.  ASSESSMENT/PLAN  82 y.o. male is here because of prostate cancer.  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  Declined XRT September 07 2021:  PSA 0.1 March 11 2022:  PSA 0.09 July 10 2022- PSA 0.12; slow elevation.  Continue to follow October 09 2022- PSA 0.15 Continue to follow  Anemia:  Etiology likely  multifactorial with possible culprits being 1) Iron deficiency from occult GI blood loss 2) CKD 3) Vitamin B12 deficiency March 14 2022- Began parenteral B12 replacement March 26 2022- Discussed consideration of GI consult.  Patient would like to hold on this for now July 10 2022-  Hgb 12.6 B12 3948 Ferritin 20 Folate 37.8.  Hgb stable.  Can consider IV iron in the future.  B12 and folate adequate  October 09 2022- Hgb 12.4.  Ferritin 20.  If patient to undergo knee replacement then I would have low threshold to give IV iron to minimize risk of transfusion.     October 21 through November 07 2022- Receiving total of Venofer 1000 mg IV   November 06 2022- Will see in follow up following surgery to assess if additional IV iron required postoperatively Arthritis of knees  October 09 2022- To be evaluate for knee arthroplasty.  It is unlikely that both knees would be operated upon in the same session due to risk of VTE.    December 11 2022- Right knee arthroplasty   Cancer Staging  Prostate cancer [facility]) Staging form: Prostate, AJCC 7th Edition - Clinical: Stage IV (T3b, N1, M0, Gleason 8-10) - Signed by Benjiman Core, MD on 08/01/2020    No problem-specific Assessment & Plan notes found for this encounter.    No orders of the defined types were placed in this encounter.   20  minutes was spent in patient care.  This included time spent preparing to see the patient (e.g., review of tests), obtaining and/or reviewing separately obtained history, counseling and educating the patient/family/caregiver, ordering medications, tests, or procedures; documenting clinical information in the electronic or other health record, independently interpreting results and communicating results to the patient/family/caregiver as well as coordination of care.       All questions were answered. The patient knows to call the clinic with any problems, questions or concerns.  This note was electronically signed.    Loni Muse, MD  11/06/2022 10:37 AM

## 2022-11-07 ENCOUNTER — Inpatient Hospital Stay: Payer: Medicare HMO

## 2022-11-07 VITALS — BP 108/67 | HR 77 | Temp 98.0°F | Resp 16 | Ht 66.0 in | Wt 156.0 lb

## 2022-11-07 DIAGNOSIS — Z87891 Personal history of nicotine dependence: Secondary | ICD-10-CM | POA: Diagnosis not present

## 2022-11-07 DIAGNOSIS — D509 Iron deficiency anemia, unspecified: Secondary | ICD-10-CM | POA: Diagnosis not present

## 2022-11-07 DIAGNOSIS — E538 Deficiency of other specified B group vitamins: Secondary | ICD-10-CM | POA: Diagnosis not present

## 2022-11-07 DIAGNOSIS — C61 Malignant neoplasm of prostate: Secondary | ICD-10-CM | POA: Diagnosis not present

## 2022-11-07 MED ORDER — IRON SUCROSE 20 MG/ML IV SOLN
200.0000 mg | Freq: Once | INTRAVENOUS | Status: AC
  Administered 2022-11-07: 200 mg via INTRAVENOUS
  Filled 2022-11-07: qty 10

## 2022-11-07 MED ORDER — ACETAMINOPHEN 325 MG PO TABS
650.0000 mg | ORAL_TABLET | Freq: Once | ORAL | Status: AC
  Administered 2022-11-07: 650 mg via ORAL
  Filled 2022-11-07: qty 2

## 2022-11-07 MED ORDER — LORATADINE 10 MG PO TABS
10.0000 mg | ORAL_TABLET | Freq: Once | ORAL | Status: AC
  Administered 2022-11-07: 10 mg via ORAL
  Filled 2022-11-07: qty 1

## 2022-11-07 MED ORDER — SODIUM CHLORIDE 0.9 % IV SOLN: Freq: Once | INTRAVENOUS | Status: DC

## 2022-11-07 NOTE — Patient Instructions (Signed)
Iron Sucrose Injection What is this medication? IRON SUCROSE (EYE ern SOO krose) treats low levels of iron (iron deficiency anemia) in people with kidney disease. Iron is a mineral that plays an important role in making red blood cells, which carry oxygen from your lungs to the rest of your body. This medicine may be used for other purposes; ask your health care provider or pharmacist if you have questions. COMMON BRAND NAME(S): Venofer What should I tell my care team before I take this medication? They need to know if you have any of these conditions: Anemia not caused by low iron levels Heart disease High levels of iron in the blood Kidney disease Liver disease An unusual or allergic reaction to iron, other medications, foods, dyes, or preservatives Pregnant or trying to get pregnant Breastfeeding How should I use this medication? This medication is for infusion into a vein. It is given in a hospital or clinic setting. Talk to your care team about the use of this medication in children. While this medication may be prescribed for children as young as 2 years for selected conditions, precautions do apply. Overdosage: If you think you have taken too much of this medicine contact a poison control center or emergency room at once. NOTE: This medicine is only for you. Do not share this medicine with others. What if I miss a dose? Keep appointments for follow-up doses. It is important not to miss your dose. Call your care team if you are unable to keep an appointment. What may interact with this medication? Do not take this medication with any of the following: Deferoxamine Dimercaprol Other iron products This medication may also interact with the following: Chloramphenicol Deferasirox This list may not describe all possible interactions. Give your health care provider a list of all the medicines, herbs, non-prescription drugs, or dietary supplements you use. Also tell them if you smoke,  drink alcohol, or use illegal drugs. Some items may interact with your medicine. What should I watch for while using this medication? Visit your care team regularly. Tell your care team if your symptoms do not start to get better or if they get worse. You may need blood work done while you are taking this medication. You may need to follow a special diet. Talk to your care team. Foods that contain iron include: whole grains/cereals, dried fruits, beans, or peas, leafy green vegetables, and organ meats (liver, kidney). What side effects may I notice from receiving this medication? Side effects that you should report to your care team as soon as possible: Allergic reactions--skin rash, itching, hives, swelling of the face, lips, tongue, or throat Low blood pressure--dizziness, feeling faint or lightheaded, blurry vision Shortness of breath Side effects that usually do not require medical attention (report to your care team if they continue or are bothersome): Flushing Headache Joint pain Muscle pain Nausea Pain, redness, or irritation at injection site This list may not describe all possible side effects. Call your doctor for medical advice about side effects. You may report side effects to FDA at 1-800-FDA-1088. Where should I keep my medication? This medication is given in a hospital or clinic. It will not be stored at home. NOTE: This sheet is a summary. It may not cover all possible information. If you have questions about this medicine, talk to your doctor, pharmacist, or health care provider.  2024 Elsevier/Gold Standard (2022-05-31 00:00:00)

## 2022-11-14 DIAGNOSIS — M25561 Pain in right knee: Secondary | ICD-10-CM | POA: Diagnosis not present

## 2022-11-18 NOTE — Care Plan (Signed)
Ortho Bundle Case Management Note  Patient Details  Name: Jesse Mann MRN: [id number] Date of Birth: May 30, 1940  met with patient in the office for H&P. will discharge to home with family to assist. has rolling walker. OPPT set up with [facility]. discharge instructions discussed and questions answered.  Patient and MD in agreement with plan. Choice offered                     DME Arranged:    DME Agency:     HH Arranged:    HH Agency:     Additional Comments: Please contact me with any questions of if this plan should need to change.  Shauna Hugh,  RN,BSN,MHA,CCM  [facility] Orthopaedic Specialist  [phone] 11/18/2022, 3:47 PM

## 2022-11-19 ENCOUNTER — Ambulatory Visit: Payer: Self-pay | Admitting: Emergency Medicine

## 2022-11-19 DIAGNOSIS — G8929 Other chronic pain: Secondary | ICD-10-CM

## 2022-11-19 NOTE — H&P (View-Only) (Signed)
 TOTAL KNEE ADMISSION H&P  Patient is being admitted for right total knee arthroplasty.  Subjective:  Chief Complaint:right knee pain.  HPI: Jesse Mann, 82 y.o. male, has a history of pain and functional disability in the right knee due to arthritis and has failed non-surgical conservative treatments for greater than 12 weeks to includeNSAID's and/or analgesics, corticosteriod injections, viscosupplementation injections, use of assistive devices, and activity modification.  Onset of symptoms was gradual, starting 1 years ago with gradually worsening course since that time. The patient noted no past surgery on the right knee(s).  Patient currently rates pain in the right knee(s) at 10 out of 10 with activity. Patient has night pain, worsening of pain with activity and weight bearing, pain that interferes with activities of daily living, and pain with passive range of motion.  Patient has evidence of periarticular osteophytes and joint space narrowing by imaging studies.  There is no active infection.  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 Revankar,cardiology [phone] [facility])   Cardiac murmur    Cardiomyopathy [facility])    Ischemic   CHF (congestive heart failure) (HCC)    Coronary artery  disease    Flail chest    GERD (gastroesophageal reflux disease)    Hemorrhoids    History of tracheostomy    History of ventricular fibrillation 07/22/2016   Hypercholesteremia    Hypertension    Left anterior fascicular block (LAFB)    Myocardial infarction (HCC)    [facility]14[facility]- "feeling strange" eavaluated, heart cath done with stents x2 placed   Pneumonia    history of   Presence of permanent cardiac pacemaker    Prostate cancer (HCC)    PVC (premature ventricular contraction)    RBBB    Recurrent dry cough    intermittent daily -occ. production   Sleep apnea     Past Surgical History:  Procedure Laterality Date   BACK SURGERY     With Rods and screws   CARDIAC CATHETERIZATION     '14- stents placed x2 only.   CARDIAC CATHETERIZATION     COLONOSCOPY  2011   CORONARY ANGIOPLASTY     CYSTOSCOPY W/ URETERAL STENT PLACEMENT Bilateral 04/16/2017   Procedure: CYSTOSCOPY WITH CATHETER  REPLACEMENT FIREFLY;  Surgeon: Crist Fat, MD;  Location: [facility];  Service: Urology;  Laterality: Bilateral;   HAND SURGERY     HERNIA REPAIR  1997 and 1995   inguinal (x2 on one side)   ICD IMPLANT     INGUINAL HERNIA REPAIR     INSERT / REPLACE / REMOVE PACEMAKER     LYMPH NODE DISSECTION Bilateral 03/03/2015   Procedure: BILATERAL PELVIC LYMPH NODE DISSECTION;  Surgeon: Crist Fat, MD;  Location: [facility];  Service: Urology;  Laterality: Bilateral;  PROSTATE BIOPSY     REPAIR EXTENSOR TENDON HAND Right    (3-4 fingers) '65- x3-4 times"trauma injury   ROBOT ASSISTED LAPAROSCOPIC RADICAL PROSTATECTOMY Bilateral 03/03/2015   Procedure: XI ROBOTIC ASSISTED LAPAROSCOPIC RADICAL PROSTATECTOMY;  Surgeon: Crist Fat, MD;  Location: [facility];  Service: Urology;  Laterality: Bilateral;   Surgery to correct flail chest after CPR     TONSILLECTOMY     TRACHEOSTOMY      Current Outpatient Medications  Medication Sig Dispense Refill Last Dose   acetaminophen  (TYLENOL) 500 MG tablet Take 500 mg by mouth every 6 (six) hours as needed (for pain.).      amiodarone (PACERONE) 200 MG tablet Take 200 mg by mouth daily. 0.5 tab (100 mg total) po daily      aspirin 81 MG tablet Take 81 mg by mouth daily.      carvedilol (COREG) 12.5 MG tablet Take 6.25 mg by mouth 2 (two) times daily with a meal.       cyanocobalamin (VITAMIN B12) 1000 MCG tablet Take 1 tablet by mouth daily.      famotidine (PEPCID) 40 MG tablet Take 40 mg at bedtime by mouth.      FARXIGA 10 MG TABS tablet Take 10 mg by mouth daily.      magnesium oxide (MAG-OX) 400 (240 Mg) MG tablet Take 1 tablet by mouth daily.      nitroGLYCERIN (NITROSTAT) 0.4 MG SL tablet Place 0.4 mg under the tongue every 5 (five) minutes as needed for chest pain.       ranolazine (RANEXA) 500 MG 12 hr tablet Take 500 mg by mouth 2 (two) times daily.      rosuvastatin (CRESTOR) 10 MG tablet Take 1 tablet by mouth daily.      No current facility-administered medications for this visit.   No Known Allergies  Social History   Tobacco Use   Smoking status: Former    Current packs/day: 0.00    Average packs/day: 1 pack/day for 10.0 years (10.0 ttl pk-yrs)    Types: Cigarettes    Start date: 01/08/1956    Quit date: 01/07/1966    Years since quitting: 56.9   Smokeless tobacco: Never  Substance Use Topics   Alcohol use: No    Alcohol/week: 0.0 standard drinks of alcohol    Family History  Problem Relation Age of Onset   Heart failure Mother    Cancer Father        colon   Cancer Paternal Grandmother        type unknown     Review of Systems  Musculoskeletal:  Positive for arthralgias.  All other systems reviewed and are negative.   Objective:  Physical Exam Constitutional:      General: He is not in acute distress.    Appearance: Normal appearance. He is normal weight.  HENT:     Head: Normocephalic and atraumatic.  Eyes:     Extraocular Movements: Extraocular movements intact.      Conjunctiva/sclera: Conjunctivae normal.     Pupils: Pupils are equal, round, and reactive to light.  Cardiovascular:     Rate and Rhythm: Normal rate and regular rhythm.     Pulses: Normal pulses.     Heart sounds: Murmur (at baseline) heard.  Pulmonary:     Effort: Pulmonary effort is normal. No respiratory distress.     Breath sounds: Normal breath sounds.  Abdominal:     General: Bowel sounds are normal. There is  no distension.     Palpations: Abdomen is soft.     Tenderness: There is no abdominal tenderness.  Musculoskeletal:        General: Tenderness present.     Cervical back: Normal range of motion and neck supple.     Comments: TTP over medial and lateral joint line.  No calf tenderness, swelling, or erythema.  No overlying lesions of area of chief complaint.  Decreased strength and ROM due to elicited pain.  Pre-operative ROM 5-115.  Dorsiflexion and plantarflexion intact.  Stable to varus and valgus stress.  BLE appear grossly neurovascularly intact.  Gait mildly antalgic.   Lymphadenopathy:     Cervical: No cervical adenopathy.  Skin:    General: Skin is warm and dry.     Capillary Refill: Capillary refill takes less than 2 seconds.     Findings: No erythema or rash.  Neurological:     General: No focal deficit present.     Mental Status: He is alert and oriented to person, place, and time.  Psychiatric:        Mood and Affect: Mood normal.        Behavior: Behavior normal.     Vital signs in last 24 hours: @VSRANGES @  Labs:   Estimated body mass index is 25.18 kg/m as calculated from the following:   Height as of 11/07/22: 5\' 6"  (1.676 m).   Weight as of 11/07/22: 70.8 kg.   Imaging Review Plain radiographs demonstrate severe degenerative joint disease of the right knee(s). The overall alignment issignificant varus. The bone quality appears to be fair for age and reported activity level.      Assessment/Plan:  End stage arthritis, right knee   The  patient history, physical examination, clinical judgment of the provider and imaging studies are consistent with end stage degenerative joint disease of the right knee(s) and total knee arthroplasty is deemed medically necessary. The treatment options including medical management, injection therapy arthroscopy and arthroplasty were discussed at length. The risks and benefits of total knee arthroplasty were presented and reviewed. The risks due to aseptic loosening, infection, stiffness, patella tracking problems, thromboembolic complications and other imponderables were discussed. The patient acknowledged the explanation, agreed to proceed with the plan and consent was signed. Patient is being admitted for inpatient treatment for surgery, pain control, PT, OT, prophylactic antibiotics, VTE prophylaxis, progressive ambulation and ADL's and discharge planning. The patient is planning to be discharged home with outpatient PT.    Anticipated LOS equal to or greater than 2 midnights due to - Age 77 and older with one or more of the following:  - Obesity  - Expected need for hospital services (PT, OT, Nursing) required for safe  discharge  - Anticipated need for postoperative skilled nursing care or inpatient rehab  - Active co-morbidities: CAD, prior MI, prior heart catheterization, HTN, HLD, CKD 3a, GERD, RBBB, prior cardiac arrest with subsequent ICD placement, anemia, OSA, ischemic cardiomyopathy, prior prostate cancer (2016 with subsequent prostatectomy), cardiac murmur OR   - Unanticipated findings during/Post Surgery: None  - Patient is a high risk of re-admission due to: None

## 2022-11-19 NOTE — H&P (Signed)
TOTAL KNEE ADMISSION H&P  Patient is being admitted for right total knee arthroplasty.  Subjective:  Chief Complaint:right knee pain.  HPI: Jesse HIDROGO, 82 y.o. male, has a history of pain and functional disability in the right knee due to arthritis and has failed non-surgical conservative treatments for greater than 12 weeks to includeNSAID's and/or analgesics, corticosteriod injections, viscosupplementation injections, use of assistive devices, and activity modification.  Onset of symptoms was gradual, starting 1 years ago with gradually worsening course since that time. The patient noted no past surgery on the right knee(s).  Patient currently rates pain in the right knee(s) at 10 out of 10 with activity. Patient has night pain, worsening of pain with activity and weight bearing, pain that interferes with activities of daily living, and pain with passive range of motion.  Patient has evidence of periarticular osteophytes and joint space narrowing by imaging studies.  There is no active infection.  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 Revankar,cardiology [phone] [facility])   Cardiac murmur    Cardiomyopathy [facility])    Ischemic   CHF (congestive heart failure) (HCC)    Coronary artery  disease    Flail chest    GERD (gastroesophageal reflux disease)    Hemorrhoids    History of tracheostomy    History of ventricular fibrillation 07/22/2016   Hypercholesteremia    Hypertension    Left anterior fascicular block (LAFB)    Myocardial infarction (HCC)    [facility]- "feeling strange" eavaluated, heart cath done with stents x2 placed   Pneumonia    history of   Presence of permanent cardiac pacemaker    Prostate cancer (HCC)    PVC (premature ventricular contraction)    RBBB    Recurrent dry cough    intermittent daily -occ. production   Sleep apnea     Past Surgical History:  Procedure Laterality Date   BACK SURGERY     With Rods and screws   CARDIAC CATHETERIZATION     '14- stents placed x2 only.   CARDIAC CATHETERIZATION     COLONOSCOPY  2011   CORONARY ANGIOPLASTY     CYSTOSCOPY W/ URETERAL STENT PLACEMENT Bilateral 04/16/2017   Procedure: CYSTOSCOPY WITH CATHETER  REPLACEMENT FIREFLY;  Surgeon: Crist Fat, MD;  Location: [facility];  Service: Urology;  Laterality: Bilateral;   HAND SURGERY     HERNIA REPAIR  1997 and 1995   inguinal (x2 on one side)   ICD IMPLANT     INGUINAL HERNIA REPAIR     INSERT / REPLACE / REMOVE PACEMAKER     LYMPH NODE DISSECTION Bilateral 03/03/2015   Procedure: BILATERAL PELVIC LYMPH NODE DISSECTION;  Surgeon: Crist Fat, MD;  Location: [facility];  Service: Urology;  Laterality: Bilateral;  PROSTATE BIOPSY     REPAIR EXTENSOR TENDON HAND Right    (3-4 fingers) '65- x3-4 times"trauma injury   ROBOT ASSISTED LAPAROSCOPIC RADICAL PROSTATECTOMY Bilateral 03/03/2015   Procedure: XI ROBOTIC ASSISTED LAPAROSCOPIC RADICAL PROSTATECTOMY;  Surgeon: Crist Fat, MD;  Location: [facility];  Service: Urology;  Laterality: Bilateral;   Surgery to correct flail chest after CPR     TONSILLECTOMY     TRACHEOSTOMY      Current Outpatient Medications  Medication Sig Dispense Refill Last Dose   acetaminophen  (TYLENOL) 500 MG tablet Take 500 mg by mouth every 6 (six) hours as needed (for pain.).      amiodarone (PACERONE) 200 MG tablet Take 200 mg by mouth daily. 0.5 tab (100 mg total) po daily      aspirin 81 MG tablet Take 81 mg by mouth daily.      carvedilol (COREG) 12.5 MG tablet Take 6.25 mg by mouth 2 (two) times daily with a meal.       cyanocobalamin (VITAMIN B12) 1000 MCG tablet Take 1 tablet by mouth daily.      famotidine (PEPCID) 40 MG tablet Take 40 mg at bedtime by mouth.      FARXIGA 10 MG TABS tablet Take 10 mg by mouth daily.      magnesium oxide (MAG-OX) 400 (240 Mg) MG tablet Take 1 tablet by mouth daily.      nitroGLYCERIN (NITROSTAT) 0.4 MG SL tablet Place 0.4 mg under the tongue every 5 (five) minutes as needed for chest pain.       ranolazine (RANEXA) 500 MG 12 hr tablet Take 500 mg by mouth 2 (two) times daily.      rosuvastatin (CRESTOR) 10 MG tablet Take 1 tablet by mouth daily.      No current facility-administered medications for this visit.   No Known Allergies  Social History   Tobacco Use   Smoking status: Former    Current packs/day: 0.00    Average packs/day: 1 pack/day for 10.0 years (10.0 ttl pk-yrs)    Types: Cigarettes    Start date: 01/08/1956    Quit date: 01/07/1966    Years since quitting: 56.9   Smokeless tobacco: Never  Substance Use Topics   Alcohol use: No    Alcohol/week: 0.0 standard drinks of alcohol    Family History  Problem Relation Age of Onset   Heart failure Mother    Cancer Father        colon   Cancer Paternal Grandmother        type unknown     Review of Systems  Musculoskeletal:  Positive for arthralgias.  All other systems reviewed and are negative.   Objective:  Physical Exam Constitutional:      General: He is not in acute distress.    Appearance: Normal appearance. He is normal weight.  HENT:     Head: Normocephalic and atraumatic.  Eyes:     Extraocular Movements: Extraocular movements intact.      Conjunctiva/sclera: Conjunctivae normal.     Pupils: Pupils are equal, round, and reactive to light.  Cardiovascular:     Rate and Rhythm: Normal rate and regular rhythm.     Pulses: Normal pulses.     Heart sounds: Murmur (at baseline) heard.  Pulmonary:     Effort: Pulmonary effort is normal. No respiratory distress.     Breath sounds: Normal breath sounds.  Abdominal:     General: Bowel sounds are normal. There is  no distension.     Palpations: Abdomen is soft.     Tenderness: There is no abdominal tenderness.  Musculoskeletal:        General: Tenderness present.     Cervical back: Normal range of motion and neck supple.     Comments: TTP over medial and lateral joint line.  No calf tenderness, swelling, or erythema.  No overlying lesions of area of chief complaint.  Decreased strength and ROM due to elicited pain.  Pre-operative ROM 5-115.  Dorsiflexion and plantarflexion intact.  Stable to varus and valgus stress.  BLE appear grossly neurovascularly intact.  Gait mildly antalgic.   Lymphadenopathy:     Cervical: No cervical adenopathy.  Skin:    General: Skin is warm and dry.     Capillary Refill: Capillary refill takes less than 2 seconds.     Findings: No erythema or rash.  Neurological:     General: No focal deficit present.     Mental Status: He is alert and oriented to person, place, and time.  Psychiatric:        Mood and Affect: Mood normal.        Behavior: Behavior normal.     Vital signs in last 24 hours: @VSRANGES @  Labs:   Estimated body mass index is 25.18 kg/m as calculated from the following:   Height as of 11/07/22: 5\' 6"  (1.676 m).   Weight as of 11/07/22: 70.8 kg.   Imaging Review Plain radiographs demonstrate severe degenerative joint disease of the right knee(s). The overall alignment issignificant varus. The bone quality appears to be fair for age and reported activity level.      Assessment/Plan:  End stage arthritis, right knee   The  patient history, physical examination, clinical judgment of the provider and imaging studies are consistent with end stage degenerative joint disease of the right knee(s) and total knee arthroplasty is deemed medically necessary. The treatment options including medical management, injection therapy arthroscopy and arthroplasty were discussed at length. The risks and benefits of total knee arthroplasty were presented and reviewed. The risks due to aseptic loosening, infection, stiffness, patella tracking problems, thromboembolic complications and other imponderables were discussed. The patient acknowledged the explanation, agreed to proceed with the plan and consent was signed. Patient is being admitted for inpatient treatment for surgery, pain control, PT, OT, prophylactic antibiotics, VTE prophylaxis, progressive ambulation and ADL's and discharge planning. The patient is planning to be discharged home with outpatient PT.    Anticipated LOS equal to or greater than 2 midnights due to - Age 79 and older with one or more of the following:  - Obesity  - Expected need for hospital services (PT, OT, Nursing) required for safe  discharge  - Anticipated need for postoperative skilled nursing care or inpatient rehab  - Active co-morbidities: CAD, prior MI, prior heart catheterization, HTN, HLD, CKD 3a, GERD, RBBB, prior cardiac arrest with subsequent ICD placement, anemia, OSA, ischemic cardiomyopathy, prior prostate cancer (2016 with subsequent prostatectomy), cardiac murmur OR   - Unanticipated findings during/Post Surgery: None  - Patient is a high risk of re-admission due to: None

## 2022-11-27 NOTE — Progress Notes (Signed)
COVID Vaccine received:  []  No [x]  Yes Date of any COVID positive Test in last 90 days:  None  PCP - Maryjean Ka, MD [phone] (Work) [phone] (Fax)    Medical clearance scanned to Epic 11-14-2022  Cardiologist - Maryln Gottron, MD  Cardiac clearance scanned in Epic 11-14-2022    Cathlean Cower, MD at [facility] [phone]   F) [phone] Oncology- Leatha Gilding, MD at [facility]   Chest x-ray - 03-06-2022  1v   [facility] EKG - 10-08-2022  [facility]   requested fax copy  Stress Test -  ECHO - TTE  04-10-2022  CEW  Cardiac Cath - 03-07-2022 [facility] at [facility].  PCR screen: [x]  Ordered & Completed []   No Order but Needs PROFEND     []   N/A for this surgery  Surgery Plan:  []  Ambulatory   [x]  Outpatient in bed  []  Admit Anesthesia:    []  General  [x]  Spinal  []   Choice []   MAC  Pacemaker / ICD device []  No [x]  Yes   [employer] Evera MRI XT DR -  Device orders- [facility] requested 11-28-2022   Fax) [phone] Spinal Cord Stimulator:[x]  No []  Yes       History of Sleep Apnea? []  No [x]  Yes   CPAP used?- [x]  No []  Yes    can't tolerate  Does the patient monitor blood sugar?   [x]  N/A   []  No []  Yes  Patient has: [x]  NO Hx DM   []  Pre-DM   []  DM1  []   DM2   FARXIGA- for renal and CHF, 10 mg daily,  Hold x 72 Hours  Patient is aware  Blood Thinner / Instructions:  none Aspirin Instructions:  ASA 81 mg   hold x 1 week per Dr. Terri Skains  ERAS Protocol Ordered: []  No  [x]  Yes PRE-SURGERY [x]  ENSURE  []  G2   Patient is to be NPO after: 05:45  Dental hx: [x]  Dentures:  full set of dentures []  N/A      []  Bridge or Partial:                   []  Loose or Damaged teeth:   Comments: Patient was given the 5 CHG shower / bath instructions for TKA surgery along with 2 bottles of the CHG soap. Patient will start this on: 12-07-22   All questions were asked and answered, Patient voiced understanding of this process.   Activity level: Patient is able to climb a flight of stairs without difficulty;  [x]  No CP  [x]  No SOB, but would have leg pain.  Patient can perform ADLs without assistance.   Anesthesia review: V.fib Cardiac Arrest (AICD placed 2018)- flail chest d/t CPR, Tracheostomy, CHF, GERD, CKD3, OSA- no CPAP , s/p prostatectomy   Patient denies shortness of breath, fever, cough and chest pain at PAT appointment.  Patient verbalized understanding and agreement to the Pre-Surgical Instructions that were given to them at this PAT appointment. Patient was also educated of the need to review these PAT instructions again prior to his surgery.I reviewed the appropriate phone numbers to call if they have any and questions or concerns.

## 2022-11-27 NOTE — Patient Instructions (Signed)
SURGICAL WAITING ROOM VISITATION Patients having surgery or a procedure may have no more than 2 support people in the waiting area - these visitors may rotate in the visitor waiting room.   Due to an increase in RSV and influenza rates and associated hospitalizations, children ages 18 and under may not visit patients in [facility] hospitals. If the patient needs to stay at the hospital during part of their recovery, the visitor guidelines for inpatient rooms apply.  PRE-OP VISITATION  Pre-op nurse will coordinate an appropriate time for 1 support person to accompany the patient in pre-op.  This support person may not rotate.  This visitor will be contacted when the time is appropriate for the visitor to come back in the pre-op area.  Please refer to the [facility] website for the visitor guidelines for Inpatients (after your surgery is over and you are in a regular room).  You are not required to quarantine at this time prior to your surgery. However, you must do this: Hand Hygiene often Do NOT share personal items Notify your provider if you are in close contact with someone who has COVID or you develop fever 100.4 or greater, new onset of sneezing, cough, sore throat, shortness of breath or body aches.  If you test positive for Covid or have been in contact with anyone that has tested positive in the last 10 days please notify you surgeon.    Your procedure is scheduled on:  Wednesday  December 11, 2022  Report to [facility] Main Entrance: [facility] entrance where the [employer] is available.   Report to admitting at: 06:15  AM  Call this number if you have any questions or problems the morning of surgery [phone]  Do not eat food after Midnight the night prior to your surgery/procedure.  After Midnight you may have the following liquids until  05:45  AM DAY OF SURGERY  Clear Liquid Diet Water Black Coffee (sugar ok, NO MILK/CREAM OR CREAMERS)  Tea (sugar ok, NO  MILK/CREAM OR CREAMERS) regular and decaf                             Plain Jell-O  with no fruit (NO RED)                                           Fruit ices (not with fruit pulp, NO RED)                                     Popsicles (NO RED)                                                                  Juice: NO CITRUS JUICES: only apple, WHITE grape, WHITE cranberry Sports drinks like Gatorade or Powerade (NO RED)                   The day of surgery:  Drink ONE (1) Pre-Surgery Clear Ensure at   05:45  AM the morning of surgery.  Drink in one sitting. Do not sip.  This drink was given to you during your hospital pre-op appointment visit. Nothing else to drink after completing the Pre-Surgery Clear Ensure : No candy, chewing gum or throat lozenges.    FOLLOW ANY ADDITIONAL PRE OP INSTRUCTIONS YOU RECEIVED FROM YOUR SURGEON'S OFFICE!!!   Oral Hygiene is also important to reduce your risk of infection.        Remember - BRUSH YOUR TEETH THE MORNING OF SURGERY WITH YOUR REGULAR TOOTHPASTE  Do NOT smoke after Midnight the night before surgery.   FARXIGA- for renal and CHF, 10 mg daily,  Hold x 72 Hours  Last dose will be taken on Saturday  12-07-22  STOP TAKING all Vitamins, Herbs and supplements 1 week before your surgery.   STOP TAKING Aspirin 7 days before your surgery. Last dose: Tuesday 12-03-22   Take ONLY these medicines the morning of surgery with A SIP OF WATER: Carvedilol, amiodarone, and you may take Tylenol if needed for pain,                    You may not have any metal on your body including jewelry, and body piercing  Do not wear  lotions, powders, perfumes  or deodorant  Men may shave face and neck.  Contacts, Hearing Aids, dentures or bridgework may not be worn into surgery. DENTURES WILL BE REMOVED PRIOR TO SURGERY PLEASE DO NOT APPLY "Poly grip" OR ADHESIVES!!!  You may bring a small overnight bag with you on the day of surgery, only pack items that are  not valuable. [facility] IS NOT RESPONSIBLE   FOR VALUABLES THAT ARE LOST OR STOLEN.   Do not bring your home medications to the hospital. The Pharmacy will dispense medications listed on your medication list to you during your admission in the Hospital.  Special Instructions: Bring a copy of your healthcare power of attorney and living will documents the day of surgery, if you wish to have them scanned into your [facility]- EPIC  Please read over the following fact sheets you were given: IF YOU HAVE QUESTIONS ABOUT YOUR PRE-OP INSTRUCTIONS, PLEASE CALL [phone]  KAY Breyon Sigg, RN     Pre-operative 5 CHG Bath Instructions   You can play a key role in reducing the risk of infection after surgery. Your skin needs to be as free of germs as possible. You can reduce the number of germs on your skin by washing with CHG (chlorhexidine gluconate) soap before surgery. CHG is an antiseptic soap that kills germs and continues to kill germs even after washing.   DO NOT use if you have an allergy to chlorhexidine/CHG or antibacterial soaps. If your skin becomes reddened or irritated, stop using the CHG and notify one of our RNs at [phone]  Please shower with the CHG soap starting 4 days before surgery using the following schedule: START SHOWERS ON Saturday  December 07, 2022  Please keep in mind the following:  DO NOT shave, including legs and underarms, starting the day of your first shower.   You may shave your face at any point before/day of surgery.   Place clean sheets on your bed the day you start using CHG soap. Use a clean washcloth (not used since being washed) for each shower. DO NOT sleep with pets once you start using the CHG.   CHG Shower Instructions:  If you choose to wash your  hair and private area, wash first with your normal shampoo/soap.  After you use shampoo/soap, rinse your hair and body thoroughly to remove shampoo/soap residue.  Turn the water OFF and apply about 3 tablespoons (45 ml) of CHG soap to a CLEAN washcloth.  Apply CHG soap ONLY FROM YOUR NECK DOWN TO YOUR TOES (washing for 3-5 minutes)  DO NOT use CHG soap on face, private areas, open wounds, or sores.  Pay special attention to the area where your surgery is being performed.  If you are having back surgery, having someone wash your back for you may be helpful.  Wait 2 minutes after CHG soap is applied, then you may rinse off the CHG soap.  Pat dry with a clean towel  Put on clean clothes/pajamas   If you choose to wear lotion, please use ONLY the CHG-compatible lotions on the back of this paper.     Additional instructions for the day of surgery: DO NOT APPLY any lotions, deodorants, cologne, or perfumes.   Put on clean/comfortable clothes.  Brush your teeth.  Ask your nurse before applying any prescription medications to the skin.      CHG Compatible Lotions   Aveeno Moisturizing lotion  Cetaphil Moisturizing Cream  Cetaphil Moisturizing Lotion  Clairol Herbal Essence Moisturizing Lotion, Dry Skin  Clairol Herbal Essence Moisturizing Lotion, Extra Dry Skin  Clairol Herbal Essence Moisturizing Lotion, Normal Skin  Curel Age Defying Therapeutic Moisturizing Lotion with Alpha Hydroxy  Curel Extreme Care Body Lotion  Curel Soothing Hands Moisturizing Hand Lotion  Curel Therapeutic Moisturizing Cream, Fragrance-Free  Curel Therapeutic Moisturizing Lotion, Fragrance-Free  Curel Therapeutic Moisturizing Lotion, Original Formula  Eucerin Daily Replenishing Lotion  Eucerin Dry Skin Therapy Plus Alpha Hydroxy Crme  Eucerin Dry Skin Therapy Plus Alpha Hydroxy Lotion  Eucerin Original Crme  Eucerin Original Lotion  Eucerin Plus Crme Eucerin Plus Lotion  Eucerin TriLipid Replenishing  Lotion  Keri Anti-Bacterial Hand Lotion  Keri Deep Conditioning Original Lotion Dry Skin Formula Softly Scented  Keri Deep Conditioning Original Lotion, Fragrance Free Sensitive Skin Formula  Keri Lotion Fast Absorbing Fragrance Free Sensitive Skin Formula  Keri Lotion Fast Absorbing Softly Scented Dry Skin Formula  Keri Original Lotion  Keri Skin Renewal Lotion Keri Silky Smooth Lotion  Keri Silky Smooth Sensitive Skin Lotion  Nivea Body Creamy Conditioning Oil  Nivea Body Extra Enriched Lotion  Nivea Body Original Lotion  Nivea Body Sheer Moisturizing Lotion Nivea Crme  Nivea Skin Firming Lotion  NutraDerm 30 Skin Lotion  NutraDerm Skin Lotion  NutraDerm Therapeutic Skin Cream  NutraDerm Therapeutic Skin Lotion  ProShield Protective Hand Cream  Provon moisturizing lotion   FAILURE TO FOLLOW THESE INSTRUCTIONS MAY RESULT IN THE CANCELLATION OF YOUR SURGERY  PATIENT SIGNATURE_________________________________  NURSE SIGNATURE__________________________________  ________________________________________________________________________      Rogelia Mire    An incentive spirometer is a tool that can help keep your lungs clear and active. This tool measures how well you are filling your lungs with each breath. Taking long  deep breaths may help reverse or decrease the chance of developing breathing (pulmonary) problems (especially infection) following: A long period of time when you are unable to move or be active. BEFORE THE PROCEDURE  If the spirometer includes an indicator to show your best effort, your nurse or respiratory therapist will set it to a desired goal. If possible, sit up straight or lean slightly forward. Try not to slouch. Hold the incentive spirometer in an upright position. INSTRUCTIONS FOR USE  Sit on the edge of your bed if possible, or sit up as far as you can in bed or on a chair. Hold the incentive spirometer in an upright position. Breathe out  normally. Place the mouthpiece in your mouth and seal your lips tightly around it. Breathe in slowly and as deeply as possible, raising the piston or the ball toward the top of the column. Hold your breath for 3-5 seconds or for as long as possible. Allow the piston or ball to fall to the bottom of the column. Remove the mouthpiece from your mouth and breathe out normally. Rest for a few seconds and repeat Steps 1 through 7 at least 10 times every 1-2 hours when you are awake. Take your time and take a few normal breaths between deep breaths. The spirometer may include an indicator to show your best effort. Use the indicator as a goal to work toward during each repetition. After each set of 10 deep breaths, practice coughing to be sure your lungs are clear. If you have an incision (the cut made at the time of surgery), support your incision when coughing by placing a pillow or rolled up towels firmly against it. Once you are able to get out of bed, walk around indoors and cough well. You may stop using the incentive spirometer when instructed by your caregiver.  RISKS AND COMPLICATIONS Take your time so you do not get dizzy or light-headed. If you are in pain, you may need to take or ask for pain medication before doing incentive spirometry. It is harder to take a deep breath if you are having pain. AFTER USE Rest and breathe slowly and easily. It can be helpful to keep track of a log of your progress. Your caregiver can provide you with a simple table to help with this. If you are using the spirometer at home, follow these instructions: SEEK MEDICAL CARE IF:  You are having difficultly using the spirometer. You have trouble using the spirometer as often as instructed. Your pain medication is not giving enough relief while using the spirometer. You develop fever of 100.5 F (38.1 C) or higher.                                                                                                     SEEK IMMEDIATE MEDICAL CARE IF:  You cough up bloody sputum that had not been present before. You develop fever of 102 F (38.9 C) or greater. You develop worsening pain at or near the incision site. MAKE SURE YOU:  Understand these instructions. Will watch your condition. Will  get help right away if you are not doing well or get worse. Document Released: 05/06/2006 Document Revised: 03/18/2011 Document Reviewed: 07/07/2006 [facility] Patient Information 2014 [place], [place].     WHAT IS A BLOOD TRANSFUSION? Blood Transfusion Information  A transfusion is the replacement of blood or some of its parts. Blood is made up of multiple cells which provide different functions. Red blood cells carry oxygen and are used for blood loss replacement. White blood cells fight against infection. Platelets control bleeding. Plasma helps clot blood. Other blood products are available for specialized needs, such as hemophilia or other clotting disorders. BEFORE THE TRANSFUSION  Who gives blood for transfusions?  Healthy volunteers who are fully evaluated to make sure their blood is safe. This is blood bank blood. Transfusion therapy is the safest it has ever been in the practice of medicine. Before blood is taken from a donor, a complete history is taken to make sure that person has no history of diseases nor engages in risky social behavior (examples are intravenous drug use or sexual activity with multiple partners). The donor's travel history is screened to minimize risk of transmitting infections, such as malaria. The donated blood is tested for signs of infectious diseases, such as HIV and hepatitis. The blood is then tested to be sure it is compatible with you in order to minimize the chance of a transfusion reaction. If you or a relative donates blood, this is often done in anticipation of surgery and is not appropriate for emergency situations. It takes many days to process the donated blood. RISKS  AND COMPLICATIONS Although transfusion therapy is very safe and saves many lives, the main dangers of transfusion include:  Getting an infectious disease. Developing a transfusion reaction. This is an allergic reaction to something in the blood you were given. Every precaution is taken to prevent this. The decision to have a blood transfusion has been considered carefully by your caregiver before blood is given. Blood is not given unless the benefits outweigh the risks. AFTER THE TRANSFUSION Right after receiving a blood transfusion, you will usually feel much better and more energetic. This is especially true if your red blood cells have gotten low (anemic). The transfusion raises the level of the red blood cells which carry oxygen, and this usually causes an energy increase. The nurse administering the transfusion will monitor you carefully for complications. HOME CARE INSTRUCTIONS  No special instructions are needed after a transfusion. You may find your energy is better. Speak with your caregiver about any limitations on activity for underlying diseases you may have. SEEK MEDICAL CARE IF:  Your condition is not improving after your transfusion. You develop redness or irritation at the intravenous (IV) site. SEEK IMMEDIATE MEDICAL CARE IF:  Any of the following symptoms occur over the next 12 hours: Shaking chills. You have a temperature by mouth above 102 F (38.9 C), not controlled by medicine. Chest, back, or muscle pain. People around you feel you are not acting correctly or are confused. Shortness of breath or difficulty breathing. Dizziness and fainting. You get a rash or develop hives. You have a decrease in urine output. Your urine turns a dark color or changes to pink, red, or brown. Any of the following symptoms occur over the next 10 days: You have a temperature by mouth above 102 F (38.9 C), not controlled by medicine. Shortness of breath. Weakness after normal  activity. The white part of the eye turns yellow (jaundice). You have a decrease in  the amount of urine or are urinating less often. Your urine turns a dark color or changes to pink, red, or brown. Document Released: 12/22/1999 Document Revised: 03/18/2011 Document Reviewed: 08/10/2007 [facility] Patient Information 2014 [place], [place].  _______________________________________________________________________

## 2022-11-28 ENCOUNTER — Other Ambulatory Visit: Payer: Self-pay

## 2022-11-28 ENCOUNTER — Encounter (HOSPITAL_COMMUNITY)
Admission: RE | Admit: 2022-11-28 | Discharge: 2022-11-28 | Disposition: A | Discharge status: Home / Self Care | Payer: Medicare HMO | Source: Ambulatory Visit | Attending: Orthopedic Surgery | Admitting: Orthopedic Surgery

## 2022-11-28 ENCOUNTER — Encounter (HOSPITAL_COMMUNITY): Payer: Self-pay

## 2022-11-28 VITALS — BP 117/67 | HR 66 | Temp 98.2°F | Resp 16 | Ht 66.0 in | Wt 158.0 lb

## 2022-11-28 DIAGNOSIS — Z01812 Encounter for preprocedural laboratory examination: Secondary | ICD-10-CM | POA: Insufficient documentation

## 2022-11-28 DIAGNOSIS — Z01818 Encounter for other preprocedural examination: Secondary | ICD-10-CM

## 2022-11-28 DIAGNOSIS — G8929 Other chronic pain: Secondary | ICD-10-CM | POA: Insufficient documentation

## 2022-11-28 DIAGNOSIS — M25561 Pain in right knee: Secondary | ICD-10-CM | POA: Insufficient documentation

## 2022-11-28 HISTORY — DX: Anemia, unspecified: D64.9

## 2022-11-28 HISTORY — DX: Chronic kidney disease, unspecified: N18.9

## 2022-11-28 LAB — CBC WITH DIFFERENTIAL/PLATELET
Abs Immature Granulocytes: 0.01 10*3/uL (ref 0.00–0.07)
Basophils Absolute: 0 10*3/uL (ref 0.0–0.1)
Basophils Relative: 1 %
Eosinophils Absolute: 0.7 10*3/uL — ABNORMAL HIGH (ref 0.0–0.5)
Eosinophils Relative: 12 %
HCT: 44 % (ref 39.0–52.0)
Hemoglobin: 13.6 g/dL (ref 13.0–17.0)
Immature Granulocytes: 0 %
Lymphocytes Relative: 20 %
Lymphs Abs: 1.2 10*3/uL (ref 0.7–4.0)
MCH: 29.5 pg (ref 26.0–34.0)
MCHC: 30.9 g/dL (ref 30.0–36.0)
MCV: 95.4 fL (ref 80.0–100.0)
Monocytes Absolute: 0.5 10*3/uL (ref 0.1–1.0)
Monocytes Relative: 9 %
Neutro Abs: 3.4 10*3/uL (ref 1.7–7.7)
Neutrophils Relative %: 58 %
Platelets: 234 10*3/uL (ref 150–400)
RBC: 4.61 MIL/uL (ref 4.22–5.81)
RDW: 15.7 % — ABNORMAL HIGH (ref 11.5–15.5)
WBC: 5.9 10*3/uL (ref 4.0–10.5)
nRBC: 0 % (ref 0.0–0.2)

## 2022-11-28 LAB — SURGICAL PCR SCREEN
MRSA, PCR: NEGATIVE
Staphylococcus aureus: POSITIVE — AB

## 2022-11-28 LAB — COMPREHENSIVE METABOLIC PANEL WITH GFR
ALT: 14 U/L (ref 0–44)
AST: 19 U/L (ref 15–41)
Albumin: 3.8 g/dL (ref 3.5–5.0)
Alkaline Phosphatase: 54 U/L (ref 38–126)
Anion gap: 5 (ref 5–15)
BUN: 21 mg/dL (ref 8–23)
CO2: 25 mmol/L (ref 22–32)
Calcium: 8.9 mg/dL (ref 8.9–10.3)
Chloride: 105 mmol/L (ref 98–111)
Creatinine, Ser: 1.29 mg/dL — ABNORMAL HIGH (ref 0.61–1.24)
GFR, Estimated: 56 mL/min — ABNORMAL LOW (ref >60)
Glucose, Bld: 103 mg/dL — ABNORMAL HIGH (ref 70–99)
Potassium: 4.6 mmol/L (ref 3.5–5.1)
Sodium: 135 mmol/L (ref 135–145)
Total Bilirubin: 0.6 mg/dL (ref <1.2)
Total Protein: 6.2 g/dL — ABNORMAL LOW (ref 6.5–8.1)

## 2022-11-28 LAB — TYPE AND SCREEN
ABO/RH(D): O POS
Antibody Screen: NEGATIVE

## 2022-12-02 NOTE — Anesthesia Preprocedure Evaluation (Addendum)
Anesthesia Evaluation  Patient identified by MRN, date of birth, ID band Patient awake    Reviewed: Allergy & Precautions, NPO status , Patient's Chart, lab work & pertinent test results  Airway Mallampati: III  TM Distance: >3 FB Neck ROM: Full    Dental  (+) Dental Advisory Given   Pulmonary sleep apnea , former smoker   breath sounds clear to auscultation       Cardiovascular hypertension, Pt. on medications + CAD, + Past MI, + Cardiac Stents and +CHF  + dysrhythmias + pacemaker + Cardiac Defibrillator  Rhythm:Regular Rate:Normal  Echo 04/10/2022 SUMMARY  Left ventricular systolic function is moderately reduced.  There is moderate global hypokinesis of the left ventricle.  LV ejection fraction = 35-40%.  Device lead in the right ventricle  There is aortic valve sclerosis.  There is no aortic stenosis.  There is trace aortic regurgitation.  There is mild to moderate mitral regurgitation.  The aortic sinus is mildly dilated.  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 with faint collateral  filling  Otherwise mild nonobstructive coronary disease with no significant lesions  seen  LVEDP is normal     Neuro/Psych  Neuromuscular disease (s/p lumbar fusion)    GI/Hepatic Neg liver ROS,GERD  ,,  Endo/Other  negative endocrine ROS    Renal/GU Renal disease     Musculoskeletal  (+) Arthritis ,    Abdominal   Peds  Hematology  (+) Blood dyscrasia, anemia   Anesthesia Other Findings   Reproductive/Obstetrics                             Lab Results  Component Value Date   WBC 5.5 12/03/2022   HGB 13.8 12/03/2022   HCT 42.7 12/03/2022   MCV 92.0 12/03/2022   PLT 208 12/03/2022   Lab Results  Component Value Date   NA 135 11/28/2022   CL 105 11/28/2022   K 4.6  11/28/2022   CO2 25 11/28/2022   BUN 21 11/28/2022   CREATININE 1.29 (H) 11/28/2022   GFRNONAA 56 (L) 11/28/2022   CALCIUM 8.9 11/28/2022   ALBUMIN 3.8 11/28/2022   GLUCOSE 103 (H) 11/28/2022    Anesthesia Physical Anesthesia Plan  ASA: 4  Anesthesia Plan: General   Post-op Pain Management: Tylenol PO (pre-op)* and Regional block*   Induction:   PONV Risk Score and Plan: 1 and Propofol infusion, Dexamethasone, Ondansetron and Treatment may vary due to age or medical condition  Airway Management Planned: Natural Airway and Simple Face Mask  Additional Equipment:   Intra-op Plan:   Post-operative Plan: Extubation in OR  Informed Consent: I have reviewed the patients History and Physical, chart, labs and discussed the procedure including the risks, benefits and alternatives for the proposed anesthesia with the patient or authorized representative who has indicated his/her understanding and acceptance.       Plan Discussed with: CRNA  Anesthesia Plan Comments: (Pt refuses SAB. Plan GA with LMA)       Anesthesia Quick Evaluation

## 2022-12-02 NOTE — Progress Notes (Signed)
Anesthesia Chart Review   Case: [id number] Date/Time: 12/11/22 0830   Procedure: TOTAL KNEE ARTHROPLASTY (Right: Knee)   Anesthesia type: Spinal   Pre-op diagnosis: OA RIGHT KNEE   Location: [facility] / [facility]   Surgeons: Joen Laura, MD       DISCUSSION:82 y.o. former smoker with h/o sleep apnea, HTN, ischemic cardiomyopathy, cardiac arrest with V fib in 2018, s/p AICD, CKD Stage III, right knee OA scheduled for above procedure 12/11/2022 with Dr. Weber Cooks.   Pt last seen by cardiology 10/08/2022. Per OV note pt stable, echo 04/2022 with EF 35-40%, pt euvolemic, blood pressure stable.   Clearance received from cardiology which states pt is optimized from cardiac standpoint.   Clearance received from PCP which states pt is optimized for planned procedure.   Device orders requested.  VS: BP 117/67 Comment: left arm sitting  Pulse 66   Temp 36.8 C (Oral)   Resp 16   Ht 5\' 6"  (1.676 m)   Wt 71.7 kg   SpO2 99%   BMI 25.50 kg/m   PROVIDERS: Street, Stephanie Coup, MD is PCP    LABS: Labs reviewed: Acceptable for surgery. (all labs ordered are listed, but only abnormal results are displayed)  Labs Reviewed  SURGICAL PCR SCREEN - Abnormal; Notable for the following components:      Result Value   Staphylococcus aureus POSITIVE (*)    All other components within normal limits  CBC WITH DIFFERENTIAL/PLATELET - Abnormal; Notable for the following components:   RDW 15.7 (*)    Eosinophils Absolute 0.7 (*)    All other components within normal limits  COMPREHENSIVE METABOLIC PANEL - Abnormal; Notable for the following components:   Glucose, Bld 103 (*)    Creatinine, Ser 1.29 (*)    Total Protein 6.2 (*)    GFR, Estimated 56 (*)    All other components within normal limits  TYPE AND SCREEN     IMAGES:   EKG:   CV: Echo 04/10/2022 SUMMARY  Left ventricular systolic function is moderately reduced.  There is moderate global hypokinesis of the left  ventricle.  LV ejection fraction = 35-40%.  Device lead in the right ventricle  There is aortic valve sclerosis.  There is no aortic stenosis.  There is trace aortic regurgitation.  There is mild to moderate mitral regurgitation.  The aortic sinus is mildly dilated.  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 Revankar,cardiology [phone] [facility])   Cardiac murmur    Cardiomyopathy [facility])    Ischemic   CHF (congestive heart failure) (HCC)    Chronic kidney disease    CKD3   Coronary artery disease    Flail chest    GERD (gastroesophageal reflux disease)    Hemorrhoids  History of tracheostomy    History of ventricular fibrillation 07/22/2016   Hypercholesteremia    Hypertension    Left anterior fascicular block (LAFB)    Myocardial infarction (HCC)    [facility]'14[facility]- "feeling strange" eavaluated, heart cath done with stents x2 placed   Pneumonia    history of   Presence of permanent cardiac pacemaker    Prostate cancer (HCC)    PVC (premature ventricular contraction)    RBBB    Recurrent dry cough    intermittent daily -occ. production   Sleep apnea     Past Surgical History:  Procedure Laterality Date   BACK SURGERY     With Rods and  screws   CARDIAC CATHETERIZATION     '14- stents placed x2 only.   CARDIAC CATHETERIZATION     COLONOSCOPY  2011   CORONARY ANGIOPLASTY     CYSTOSCOPY W/ URETERAL STENT PLACEMENT Bilateral 04/16/2017   Procedure: CYSTOSCOPY WITH CATHETER  REPLACEMENT FIREFLY;  Surgeon: Crist Fat, MD;  Location: [facility];  Service: Urology;  Laterality: Bilateral;   EYE SURGERY Bilateral    cataract extraction   HAND SURGERY     HERNIA REPAIR  1997 and 1995   inguinal (x2 on one side)   ICD IMPLANT     INGUINAL HERNIA REPAIR     INSERT / REPLACE / REMOVE PACEMAKER     LYMPH NODE DISSECTION Bilateral 03/03/2015   Procedure: BILATERAL PELVIC LYMPH NODE DISSECTION;  Surgeon: Crist Fat, MD;  Location: [facility];  Service: Urology;  Laterality: Bilateral;   PROSTATE BIOPSY     REPAIR EXTENSOR TENDON HAND Right    (3-4 fingers) '65- x3-4 times"trauma injury   ROBOT ASSISTED LAPAROSCOPIC RADICAL PROSTATECTOMY Bilateral 03/03/2015   Procedure: XI ROBOTIC ASSISTED LAPAROSCOPIC RADICAL PROSTATECTOMY;  Surgeon: Crist Fat, MD;  Location: [facility];  Service: Urology;  Laterality: Bilateral;   Surgery to correct flail chest after CPR     TONSILLECTOMY     TRACHEOSTOMY      MEDICATIONS:  acetaminophen (TYLENOL) 500 MG tablet   amiodarone (PACERONE) 200 MG tablet   aspirin 81 MG tablet   carvedilol (COREG) 12.5 MG tablet   cyanocobalamin (VITAMIN B12) 1000 MCG tablet   famotidine (PEPCID) 40 MG tablet   FARXIGA 10 MG TABS tablet   magnesium oxide (MAG-OX) 400 (240 Mg) MG tablet   nitroGLYCERIN (NITROSTAT) 0.4 MG SL tablet   rosuvastatin (CRESTOR) 10 MG tablet   No current facility-administered medications for this encounter.     Jodell Cipro Ward, PA-C [facility] [phone]

## 2022-12-03 ENCOUNTER — Telehealth: Payer: Self-pay | Admitting: Oncology

## 2022-12-03 ENCOUNTER — Inpatient Hospital Stay: Payer: Medicare HMO

## 2022-12-03 ENCOUNTER — Inpatient Hospital Stay: Payer: Medicare HMO | Attending: Oncology | Admitting: Oncology

## 2022-12-03 VITALS — BP 113/76 | HR 89 | Resp 18 | Ht 66.0 in | Wt 158.8 lb

## 2022-12-03 DIAGNOSIS — C61 Malignant neoplasm of prostate: Secondary | ICD-10-CM | POA: Diagnosis not present

## 2022-12-03 DIAGNOSIS — Z01818 Encounter for other preprocedural examination: Secondary | ICD-10-CM | POA: Insufficient documentation

## 2022-12-03 DIAGNOSIS — Z87891 Personal history of nicotine dependence: Secondary | ICD-10-CM | POA: Diagnosis not present

## 2022-12-03 DIAGNOSIS — M170 Bilateral primary osteoarthritis of knee: Secondary | ICD-10-CM | POA: Diagnosis not present

## 2022-12-03 DIAGNOSIS — I130 Hypertensive heart and chronic kidney disease with heart failure and stage 1 through stage 4 chronic kidney disease, or unspecified chronic kidney disease: Secondary | ICD-10-CM | POA: Insufficient documentation

## 2022-12-03 DIAGNOSIS — D500 Iron deficiency anemia secondary to blood loss (chronic): Secondary | ICD-10-CM | POA: Diagnosis not present

## 2022-12-03 DIAGNOSIS — N183 Chronic kidney disease, stage 3 unspecified: Secondary | ICD-10-CM | POA: Diagnosis not present

## 2022-12-03 DIAGNOSIS — D649 Anemia, unspecified: Secondary | ICD-10-CM | POA: Diagnosis not present

## 2022-12-03 DIAGNOSIS — E538 Deficiency of other specified B group vitamins: Secondary | ICD-10-CM | POA: Insufficient documentation

## 2022-12-03 DIAGNOSIS — D509 Iron deficiency anemia, unspecified: Secondary | ICD-10-CM | POA: Diagnosis not present

## 2022-12-03 LAB — CBC WITH DIFFERENTIAL (CANCER CENTER ONLY)
Abs Immature Granulocytes: 0.01 10*3/uL (ref 0.00–0.07)
Basophils Absolute: 0 10*3/uL (ref 0.0–0.1)
Basophils Relative: 1 %
Eosinophils Absolute: 0.5 10*3/uL (ref 0.0–0.5)
Eosinophils Relative: 9 %
HCT: 42.7 % (ref 39.0–52.0)
Hemoglobin: 13.8 g/dL (ref 13.0–17.0)
Immature Granulocytes: 0 %
Lymphocytes Relative: 17 %
Lymphs Abs: 1 10*3/uL (ref 0.7–4.0)
MCH: 29.7 pg (ref 26.0–34.0)
MCHC: 32.3 g/dL (ref 30.0–36.0)
MCV: 92 fL (ref 80.0–100.0)
Monocytes Absolute: 0.7 10*3/uL (ref 0.1–1.0)
Monocytes Relative: 12 %
Neutro Abs: 3.3 10*3/uL (ref 1.7–7.7)
Neutrophils Relative %: 61 %
Platelet Count: 208 10*3/uL (ref 150–400)
RBC: 4.64 MIL/uL (ref 4.22–5.81)
RDW: 15.9 % — ABNORMAL HIGH (ref 11.5–15.5)
WBC Count: 5.5 10*3/uL (ref 4.0–10.5)
nRBC: 0 % (ref 0.0–0.2)
nRBC: 0 /100{WBCs}

## 2022-12-03 LAB — VITAMIN B12: Vitamin B-12: 1055 pg/mL — ABNORMAL HIGH (ref 180–914)

## 2022-12-03 LAB — FOLATE: Folate: 21.6 ng/mL (ref >5.9)

## 2022-12-03 LAB — FERRITIN: Ferritin: 277 ng/mL (ref 24–336)

## 2022-12-03 NOTE — Progress Notes (Signed)
[facility] Cancer Follow up  Visit:  Patient Care Team: Street, Stephanie Coup, MD as PCP - General (Family Medicine)  CHIEF COMPLAINTS/PURPOSE OF CONSULTATION:  Oncology History  Prostate cancer [facility])  08/2014 Tumor Marker   PSA 10.1   11/01/2014 Tumor Marker   PSA 12.22   12/20/2014 Procedure   Prostate biopsy. 10/12 cores (+) for prostate adenoca. Gleason 9 (5+4) disease noted in 4 cores. PNI+. Gleason 9 (4+5) noted in 2 cores. PNI+.  Gleason 8 (5+3) in 1 core and Gleason 8 (4+4) in 1 core. Gleason 7 (4+3) in 2 cores. PNI+.    12/20/2014 Initial Diagnosis   Prostate cancer (HCC)   01/16/2015 Imaging   Bone scan: 2 punctate areas of increased activity noted about left face/skull. 1 punctate area of increased activity noted about right occipital skull. Maxillofacial and head CT with attention to bone windows suggested for further eval.   08/01/2020 Cancer Staging   Staging form: Prostate, AJCC 7th Edition - Clinical: Stage IV (T3b, N1, M0, Gleason 8-10) - Signed by Benjiman Core, MD on 08/01/2020     HISTORY OF PRESENTING ILLNESS: Jesse Mann 82 y.o. male is here because of prostate cancer Medical history notable for ischemic cardiomyopathy, cardiac arrhythmia requiring AICD placement, sleep apnea, MI, rheumatoid arthritis  December 20, 2014 diagnosed with Gleason 9 (5+4 disease with perineural invasion) January 26, 2015: Bone scan showed 2 punctate areas of increased activity about the left face/skull and a punctate area of increased activity about the right occipital bone March 03, 2015 radical prostatectomy.  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  He declined radiation October 29, 2017 with last dose of Lupron   September 07, 2021: WBC  6.9 hemoglobin 12.3 platelet count 184; 63 segs 18 lymphs 10 monos 8 eos creatinine 1.3 PSA 0.1  January 14 2022:  L2-3 laminectomy and fusion with pedicle screws and TLIF, allograft and bone marrow aspiration, O-arm   March 11 2022:  About 5 weeks ago underwent spine surgery.  Last week AICD went off; he was subsequently admitted for evaluation which included a cardiac cath.    WBC 5.3 hemoglobin 12.5 platelet count 254; 49 segs 26 lymphs 11 monos 13 eos 1 basophil.  Reticulocytes 1.1%. Coombs test negative haptoglobin 163 Ferritin 21 folate 16.9 B12 170 PSA 0.09 CMP notable for creatinine 1.24 BUN 27  March 06 2022:  Cardiac ECHO- LV is moderately dilated. Mild left ventricular hypertrophy. LV ejection fraction = 35-40%. There is mild to moderate global hypokinesis of the left ventricle.  Left heart cath- Coronary Findings Diagnostic Dominance: Right  Left Main: Dist LM to Ost LAD lesion is 20% stenosed. Left Anterior Descending: Prox LAD to Mid LAD lesion is 10% stenosed. First Diagonal Branch: 1st Diag lesion is 20% stenosed. Left Circumflex: Ost Cx to Prox Cx lesion is 20% stenosed. Prox Cx to Mid Cx lesion is 30% stenosed. Right Coronary Artery: Prox RCA to Mid RCA lesion is 100% stenosed. The lesion is chronically occluded. The lesion was not previously treated.  Intervention- No interventions have been documented.   March 7 through March 22, 2022:  Received 1000 mcg of vitamin B12 x 7 doses   March 26 2022:  Patient on ASA 81 mg daily.  Last colonoscopy was in 2011.  Discussed  referral to GI for consideration of endoscopy.  He is hesitant to consider it because of age.  Will continue to follow Hgb, ferritin levels and if downward trend then rediscuss GI consult.  Will switch from B12 sq to sublingual.  Had not previously been taking B12 regularly.  Fatigued since carvedilol dose increased by cardiology.  (I encouraged him to discuss this with cardiology)  July 10 2022:     Gait  improved with recovery from spine surgery.  Taking B12 sublingual.  Remains on ASA 81 mg daily.  Has gotten used to increased carvedilol dose.  Defibrillator has not gone off again.  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  To see orthopedic surgeon this week.   He was inquiring about having both knees simultaneously.  Informed him that is not likely going to be possible.    Hgb 12.4 Ferritin 20 B12 2498 Folate 18.5   PSA 0.15  Cr 1.45    October 21 through October 31 2022:  Received a total of 600 mg Venofer  November 06 2022:  Scheduled follow up for management of prostate cancer and anemia.  Feels well.  Reviewed results of labs with patient.   Venofer 200 mg  November 07 2022:  Venofer 200 mg   December 03 2022:  Scheduled follow up for anemia.  Feels well.  Anticipates that left knee will be done several months after recovering from the right.   Hgb 13.8   December 11 2022:  Right knee arthroplasty.     Review of Systems  Constitutional:  Negative for chills, fatigue and fever.  HENT:   Negative for mouth sores, nosebleeds, sore throat, tinnitus and trouble swallowing.   Eyes:  Negative for eye problems and icterus.       Vision changes:  None  Respiratory:  Negative for chest tightness, cough, hemoptysis, shortness of breath and wheezing.        PND:  none Orthopnea:  none DOE:    Cardiovascular:  Negative for chest pain, leg swelling and palpitations.       PND:  none Orthopnea:  none  Gastrointestinal:  Negative for abdominal pain, constipation, diarrhea, nausea and vomiting.       Occasional hematochezia attributed to hemorrhoids   Endocrine: Negative for hot flashes.       Cold intolerance:  none Heat intolerance:  none  Genitourinary:  Negative for bladder incontinence, difficulty urinating, dysuria, frequency, hematuria  and nocturia.   Musculoskeletal:  Positive for arthralgias, back pain and gait problem. Negative for myalgias.  Skin:  Negative for itching, rash and wound.  Neurological:  Positive for extremity weakness and gait problem. Negative for dizziness, headaches, light-headedness, numbness, seizures and speech difficulty.       Ambulates with a cane  Hematological:  Negative for adenopathy. Does not bruise/bleed easily.  Psychiatric/Behavioral:  Negative for sleep disturbance and suicidal ideas. The patient is not nervous/anxious.     MEDICAL HISTORY: Past Medical History:  Diagnosis Date   AICD (automatic cardioverter/defibrillator) present    Anemia    Arthritis    fingers- right (2-4 fingers with ROM issues).knee left   Bifascicular block    Cardiac arrest (HCC) [facility]   Cardiac arrhythmia    Dr. Revankar,cardiology [phone] [facility])  Cardiac murmur    Cardiomyopathy (HCC)    Ischemic   CHF (congestive heart failure) (HCC)    Chronic kidney disease    CKD3   Coronary artery disease    Flail chest    GERD (gastroesophageal reflux disease)    Hemorrhoids    History of tracheostomy    History of ventricular fibrillation 07/22/2016   Hypercholesteremia    Hypertension    Left anterior fascicular block (LAFB)    Myocardial infarction (HCC)    7'14[facility]- "feeling strange" eavaluated, heart cath done with stents x2 placed   Pneumonia    history of   Presence of permanent cardiac pacemaker    Prostate cancer (HCC)    PVC (premature ventricular contraction)    RBBB    Recurrent dry cough    intermittent daily -occ. production   Sleep apnea     SURGICAL HISTORY: Past Surgical History:  Procedure Laterality Date   BACK SURGERY     With Rods and screws   CARDIAC CATHETERIZATION     '14- stents placed x2 only.   CARDIAC CATHETERIZATION     COLONOSCOPY  2011   CORONARY  ANGIOPLASTY     CYSTOSCOPY W/ URETERAL STENT PLACEMENT Bilateral 04/16/2017   Procedure: CYSTOSCOPY WITH CATHETER  REPLACEMENT FIREFLY;  Surgeon: Crist Fat, MD;  Location: [facility];  Service: Urology;  Laterality: Bilateral;   EYE SURGERY Bilateral    cataract extraction   HAND SURGERY     HERNIA REPAIR  1997 and 1995   inguinal (x2 on one side)   ICD IMPLANT     INGUINAL HERNIA REPAIR     INSERT / REPLACE / REMOVE PACEMAKER     LYMPH NODE DISSECTION Bilateral 03/03/2015   Procedure: BILATERAL PELVIC LYMPH NODE DISSECTION;  Surgeon: Crist Fat, MD;  Location: [facility];  Service: Urology;  Laterality: Bilateral;   PROSTATE BIOPSY     REPAIR EXTENSOR TENDON HAND Right    (3-4 fingers) '65- x3-4 times"trauma injury   ROBOT ASSISTED LAPAROSCOPIC RADICAL PROSTATECTOMY Bilateral 03/03/2015   Procedure: XI ROBOTIC ASSISTED LAPAROSCOPIC RADICAL PROSTATECTOMY;  Surgeon: Crist Fat, MD;  Location: [facility];  Service: Urology;  Laterality: Bilateral;   Surgery to correct flail chest after CPR     TONSILLECTOMY     TRACHEOSTOMY      SOCIAL HISTORY: Social History   Socioeconomic History   Marital status: Married    Spouse name: Not on file   Number of children: 3   Years of education: Not on file   Highest education level: Not on file  Occupational History   Occupation: Retired  Tobacco Use   Smoking status: Former    Current packs/day: 0.00    Average packs/day: 1 pack/day for 10.0 years (10.0 ttl pk-yrs)    Types: Cigarettes    Start date: 01/08/1956    Quit date: 01/07/1966    Years since quitting: 56.9   Smokeless tobacco: Never  Vaping Use   Vaping status: Never Used  Substance and Sexual Activity   Alcohol use: No    Alcohol/week: 0.0 standard drinks of alcohol   Drug use: No   Sexual activity: Not Currently  Other Topics Concern   Not on file  Social History Narrative   Not on file   Social Determinants of Health   Financial Resource Strain: Not on  file  Food Insecurity: Low Risk  (03/06/2022)   [facility], [facility]  Hunger Vital Sign    Worried About Running Out of Food in the Last Year: Never true    Within the past 12 months, the food you bought just didn't last and you didn't have money to get more: Not on file  Transportation Needs: No Transportation Needs (03/06/2022)   [facility]   Transportation    In the past 12 months, has lack of reliable transportation kept you from medical appointments, meetings, work or from getting things needed for daily living? : No  Physical Activity: Not on file  Stress: Not on file  Social Connections: Not on file  Intimate Partner Violence: Low Risk  (03/06/2022)   Received from [facility] visits prior to 03/09/2022., [facility] visits prior to 03/09/2022.   Safety    How often does anyone, including family and friends, physically hurt you?: Never    How often does anyone, including family and friends, insult or talk down to you?: Never    How often does anyone, including family and friends, threaten you with harm?: Never    How often does anyone, including family and friends, scream or curse at you?: Never    FAMILY HISTORY Family History  Problem Relation Age of Onset   Heart failure Mother    Cancer Father        colon   Cancer Paternal Grandmother        type unknown    ALLERGIES:  has No Known Allergies.  MEDICATIONS:  Current Outpatient Medications  Medication Sig Dispense Refill   acetaminophen (TYLENOL) 500 MG tablet Take 500 mg by mouth every 6 (six) hours as needed for moderate pain (pain score 4-6).     amiodarone (PACERONE) 200 MG tablet Take 200 mg by mouth daily.     aspirin 81 MG tablet Take 81 mg by mouth daily.     carvedilol (COREG) 12.5 MG tablet Take 6.25 mg by mouth 2 (two) times daily with a meal.     cyanocobalamin (VITAMIN B12) 1000 MCG tablet Take 1,000 mcg by mouth  daily.     famotidine (PEPCID) 40 MG tablet Take 40 mg at bedtime by mouth.     FARXIGA 10 MG TABS tablet Take 10 mg by mouth daily.     magnesium oxide (MAG-OX) 400 (240 Mg) MG tablet Take 400 mg by mouth daily.     nitroGLYCERIN (NITROSTAT) 0.4 MG SL tablet Place 0.4 mg under the tongue every 5 (five) minutes as needed for chest pain.      rosuvastatin (CRESTOR) 10 MG tablet Take 10 mg by mouth daily.     No current facility-administered medications for this visit.    PHYSICAL EXAMINATION:  ECOG PERFORMANCE STATUS: 1 - Symptomatic but completely ambulatory   There were no vitals filed for this visit.    There were no vitals filed for this visit.     Physical Exam Vitals and nursing note reviewed.  Constitutional:      Appearance: Normal appearance. He is not diaphoretic.     Comments: Here alone  HENT:     Head: Normocephalic and atraumatic.     Right Ear: External ear normal.     Left Ear: External ear normal.     Nose: Nose normal.  Eyes:     Conjunctiva/sclera: Conjunctivae normal.     Pupils: Pupils are equal, round, and reactive to light.  Cardiovascular:     Rate and Rhythm: Regular  rhythm. Tachycardia present.     Heart sounds: Normal heart sounds.     No friction rub. No gallop.  Pulmonary:     Effort: Pulmonary effort is normal. No respiratory distress.     Breath sounds: Normal breath sounds. No stridor. No wheezing or rhonchi.  Abdominal:     Palpations: Abdomen is soft.     Tenderness: There is no abdominal tenderness. There is no guarding or rebound.  Musculoskeletal:        General: Deformity present.     Cervical back: Normal range of motion and neck supple. No rigidity or tenderness.     Right lower leg: No edema.     Left lower leg: No edema.     Comments: Deformity of hands secondary RA.  Ambulates with a cane  Lymphadenopathy:     Head:     Right side of head: No submental, submandibular, tonsillar, preauricular, posterior auricular or  occipital adenopathy.     Left side of head: No submental, submandibular, tonsillar, preauricular, posterior auricular or occipital adenopathy.     Cervical: No cervical adenopathy.     Right cervical: No superficial, deep or posterior cervical adenopathy.    Left cervical: No superficial, deep or posterior cervical adenopathy.     Upper Body:     Right upper body: No supraclavicular or axillary adenopathy.     Left upper body: No supraclavicular or axillary adenopathy.     Lower Body: No right inguinal adenopathy. No left inguinal adenopathy.  Skin:    Coloration: Skin is not jaundiced or pale.     Findings: No bruising.  Neurological:     General: No focal deficit present.     Mental Status: He is alert and oriented to person, place, and time.     Cranial Nerves: No cranial nerve deficit.     Gait: Gait abnormal.  Psychiatric:        Mood and Affect: Mood normal.        Behavior: Behavior normal.        Thought Content: Thought content normal.        Judgment: Judgment normal.    LABORATORY DATA: I have personally reviewed the data as listed:  Hospital Outpatient Visit on 11/28/2022  Component Date Value Ref Range Status   MRSA, PCR 11/28/2022 NEGATIVE  NEGATIVE Final   Staphylococcus aureus 11/28/2022 POSITIVE (A)  NEGATIVE Final   Comment: (NOTE) The Xpert SA Assay (FDA approved for NASAL specimens in patients 29 years of age and older), is one component of a comprehensive surveillance program. It is not intended to diagnose infection nor to guide or monitor treatment. Performed at [facility], [address] [place], [place] [address]    WBC 11/28/2022 5.9  4.0 - 10.5 K/uL Final   RBC 11/28/2022 4.61  4.22 - 5.81 MIL/uL Final   Hemoglobin 11/28/2022 13.6  13.0 - 17.0 g/dL Final   HCT 82/95/6213 44.0  39.0 - 52.0 % Final   MCV 11/28/2022 95.4  80.0 - 100.0 fL Final   MCH 11/28/2022 29.5  26.0 - 34.0 pg Final   MCHC 11/28/2022 30.9  30.0 - 36.0  g/dL Final   RDW 08/65/7846 15.7 (H)  11.5 - 15.5 % Final   Platelets 11/28/2022 234  150 - 400 K/uL Final   nRBC 11/28/2022 0.0  0.0 - 0.2 % Final   Neutrophils Relative % 11/28/2022 58  % Final   Neutro Abs 11/28/2022 3.4  1.7 -  7.7 K/uL Final   Lymphocytes Relative 11/28/2022 20  % Final   Lymphs Abs 11/28/2022 1.2  0.7 - 4.0 K/uL Final   Monocytes Relative 11/28/2022 9  % Final   Monocytes Absolute 11/28/2022 0.5  0.1 - 1.0 K/uL Final   Eosinophils Relative 11/28/2022 12  % Final   Eosinophils Absolute 11/28/2022 0.7 (H)  0.0 - 0.5 K/uL Final   Basophils Relative 11/28/2022 1  % Final   Basophils Absolute 11/28/2022 0.0  0.0 - 0.1 K/uL Final   Immature Granulocytes 11/28/2022 0  % Final   Abs Immature Granulocytes 11/28/2022 0.01  0.00 - 0.07 K/uL Final   Performed at [facility], [address] [place], [place] [address]   Sodium 11/28/2022 135  135 - 145 mmol/L Final   Potassium 11/28/2022 4.6  3.5 - 5.1 mmol/L Final   Chloride 11/28/2022 105  98 - 111 mmol/L Final   CO2 11/28/2022 25  22 - 32 mmol/L Final   Glucose, Bld 11/28/2022 103 (H)  70 - 99 mg/dL Final   Glucose reference range applies only to samples taken after fasting for at least 8 hours.   BUN 11/28/2022 21  8 - 23 mg/dL Final   Creatinine, Ser 11/28/2022 1.29 (H)  0.61 - 1.24 mg/dL Final   Calcium 91/47/8295 8.9  8.9 - 10.3 mg/dL Final   Total Protein 62/13/0865 6.2 (L)  6.5 - 8.1 g/dL Final   Albumin 78/46/9629 3.8  3.5 - 5.0 g/dL Final   AST 52/84/1324 19  15 - 41 U/L Final   ALT 11/28/2022 14  0 - 44 U/L Final   Alkaline Phosphatase 11/28/2022 54  38 - 126 U/L Final   Total Bilirubin 11/28/2022 0.6  <1.2 mg/dL Final   GFR, Estimated 11/28/2022 56 (L)  >60 mL/min Final   Comment: (NOTE) Calculated using the CKD-EPI Creatinine Equation (2021)    Anion gap 11/28/2022 5  5 - 15 Final   Performed at [facility], [address] [place], [place] [address]   ABO/RH(D)  11/28/2022 O POS   Final   Antibody Screen 11/28/2022 NEG   Final   Sample Expiration 11/28/2022 12/12/2022,2359   Final   Extend sample reason 11/28/2022    Final                   Value:NO TRANSFUSIONS OR PREGNANCY IN THE PAST 3 MONTHS Performed at [facility], [address], [place], [place] [address]     RADIOGRAPHIC STUDIES: I have personally reviewed the radiological images as listed and agree with the findings in the report  No results found.  ASSESSMENT/PLAN  82 y.o. male is here because of prostate cancer.  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  Declined XRT September 07 2021:  PSA 0.1 March 11 2022:  PSA 0.09 July 10 2022- PSA 0.12; slow elevation.  Continue to follow October 09 2022- PSA 0.15 Continue to follow  Anemia:  Etiology likely multifactorial with possible culprits being 1) Iron deficiency from occult GI blood loss 2) CKD 3) Vitamin B12 deficiency March 14 2022- Began parenteral B12 replacement March 26 2022- Discussed consideration of GI consult.  Patient would like to hold on this for now July 10 2022-  Hgb 12.6 B12 3948 Ferritin 20 Folate 37.8.  Hgb stable.  Can consider IV iron in the future.  B12 and folate adequate  October 09 2022- Hgb 12.4.  Ferritin 20.  If patient to undergo knee replacement then I would have low threshold to give IV iron to minimize risk of transfusion.    October 21 through November 07 2022- Receiving total of Venofer 1000 mg IV   November 06 2022- Will see in follow up following surgery to assess if additional IV iron required postoperatively  December 03 2022- Hgb 13.8.  Awaiting  results of ferritin.  Should have adequate iron stores to recover well from surgery  Arthritis of knees  October 09 2022- To be evaluate for knee arthroplasty.  It is unlikely that both knees would be operated upon in the same session due to risk of VTE.    December 11 2022- Right knee arthroplasty   Cancer Staging  Prostate cancer [facility]) Staging form: Prostate, AJCC 7th Edition - Clinical: Stage IV (T3b, N1, M0, Gleason 8-10) - Signed by Benjiman Core, MD on 08/01/2020    No problem-specific Assessment & Plan notes found for this encounter.    No orders of the defined types were placed in this encounter.   20  minutes was spent in patient care.  This included time spent preparing to see the patient (e.g., review of tests), obtaining and/or reviewing separately obtained history, counseling and educating the patient/family/caregiver, ordering medications, tests, or procedures; documenting clinical information in the electronic or other health record, independently interpreting results and communicating results to the patient/family/caregiver as well as coordination of care.       All questions were answered. The patient knows to call the clinic with any problems, questions or concerns.  This note was electronically signed.    Loni Muse, MD  12/03/2022 9:19 AM

## 2022-12-03 NOTE — Telephone Encounter (Signed)
Patient has been scheduled for follow-up visit per 12/03/22 LOS.  Pt given an appt calendar with date and time.

## 2022-12-11 ENCOUNTER — Ambulatory Visit (HOSPITAL_COMMUNITY): Payer: Self-pay | Admitting: Anesthesiology

## 2022-12-11 ENCOUNTER — Encounter (HOSPITAL_COMMUNITY)
Admission: RE | Disposition: A | Discharge status: Home / Self Care | Payer: Self-pay | Source: Home / Self Care | Attending: Orthopedic Surgery

## 2022-12-11 ENCOUNTER — Other Ambulatory Visit: Payer: Self-pay

## 2022-12-11 ENCOUNTER — Observation Stay (HOSPITAL_COMMUNITY): Payer: Medicare HMO

## 2022-12-11 ENCOUNTER — Encounter (HOSPITAL_COMMUNITY): Payer: Self-pay | Admitting: Orthopedic Surgery

## 2022-12-11 ENCOUNTER — Observation Stay (HOSPITAL_COMMUNITY)
Admission: RE | Admit: 2022-12-11 | Discharge: 2022-12-12 | Disposition: A | Discharge status: Home / Self Care | Payer: Medicare HMO | Attending: Orthopedic Surgery | Admitting: Orthopedic Surgery

## 2022-12-11 ENCOUNTER — Ambulatory Visit (HOSPITAL_COMMUNITY): Payer: Medicare HMO | Admitting: Physician Assistant

## 2022-12-11 DIAGNOSIS — R609 Edema, unspecified: Secondary | ICD-10-CM | POA: Diagnosis not present

## 2022-12-11 DIAGNOSIS — Z79899 Other long term (current) drug therapy: Secondary | ICD-10-CM | POA: Diagnosis not present

## 2022-12-11 DIAGNOSIS — Z96651 Presence of right artificial knee joint: Secondary | ICD-10-CM | POA: Diagnosis not present

## 2022-12-11 DIAGNOSIS — M1711 Unilateral primary osteoarthritis, right knee: Secondary | ICD-10-CM | POA: Diagnosis not present

## 2022-12-11 DIAGNOSIS — G8918 Other acute postprocedural pain: Secondary | ICD-10-CM | POA: Diagnosis not present

## 2022-12-11 DIAGNOSIS — Z950 Presence of cardiac pacemaker: Secondary | ICD-10-CM | POA: Insufficient documentation

## 2022-12-11 DIAGNOSIS — Z7982 Long term (current) use of aspirin: Secondary | ICD-10-CM | POA: Insufficient documentation

## 2022-12-11 DIAGNOSIS — N183 Chronic kidney disease, stage 3 unspecified: Secondary | ICD-10-CM | POA: Insufficient documentation

## 2022-12-11 DIAGNOSIS — I110 Hypertensive heart disease with heart failure: Secondary | ICD-10-CM | POA: Diagnosis not present

## 2022-12-11 DIAGNOSIS — I2510 Atherosclerotic heart disease of native coronary artery without angina pectoris: Secondary | ICD-10-CM | POA: Diagnosis not present

## 2022-12-11 DIAGNOSIS — Z8546 Personal history of malignant neoplasm of prostate: Secondary | ICD-10-CM | POA: Insufficient documentation

## 2022-12-11 DIAGNOSIS — Z87891 Personal history of nicotine dependence: Secondary | ICD-10-CM | POA: Insufficient documentation

## 2022-12-11 DIAGNOSIS — Z471 Aftercare following joint replacement surgery: Secondary | ICD-10-CM | POA: Diagnosis not present

## 2022-12-11 DIAGNOSIS — Z96641 Presence of right artificial hip joint: Secondary | ICD-10-CM | POA: Diagnosis not present

## 2022-12-11 DIAGNOSIS — I130 Hypertensive heart and chronic kidney disease with heart failure and stage 1 through stage 4 chronic kidney disease, or unspecified chronic kidney disease: Secondary | ICD-10-CM | POA: Insufficient documentation

## 2022-12-11 DIAGNOSIS — I509 Heart failure, unspecified: Secondary | ICD-10-CM | POA: Diagnosis not present

## 2022-12-11 HISTORY — PX: TOTAL KNEE ARTHROPLASTY: SHX125

## 2022-12-11 SURGERY — ARTHROPLASTY, KNEE, TOTAL
Anesthesia: Spinal | Site: Knee | Laterality: Right

## 2022-12-11 MED ORDER — AMIODARONE HCL 200 MG PO TABS
200.0000 mg | ORAL_TABLET | Freq: Every day | ORAL | Status: DC
  Administered 2022-12-12: 200 mg via ORAL
  Filled 2022-12-11: qty 1

## 2022-12-11 MED ORDER — CLONIDINE HCL (ANALGESIA) 100 MCG/ML EP SOLN
EPIDURAL | Status: DC | PRN
  Administered 2022-12-11: 50 ug

## 2022-12-11 MED ORDER — LACTATED RINGERS IV SOLN: INTRAVENOUS | Status: DC

## 2022-12-11 MED ORDER — PANTOPRAZOLE SODIUM 40 MG PO TBEC
40.0000 mg | DELAYED_RELEASE_TABLET | Freq: Every day | ORAL | Status: DC
  Administered 2022-12-11 – 2022-12-12 (×2): 40 mg via ORAL
  Filled 2022-12-11 (×2): qty 1

## 2022-12-11 MED ORDER — ONDANSETRON HCL 4 MG/2ML IJ SOLN
INTRAMUSCULAR | Status: DC | PRN
  Administered 2022-12-11: 4 mg via INTRAVENOUS

## 2022-12-11 MED ORDER — TRANEXAMIC ACID-NACL 1000-0.7 MG/100ML-% IV SOLN
1000.0000 mg | INTRAVENOUS | Status: AC
  Administered 2022-12-11: 1000 mg via INTRAVENOUS
  Filled 2022-12-11: qty 100

## 2022-12-11 MED ORDER — DOCUSATE SODIUM 100 MG PO CAPS
100.0000 mg | ORAL_CAPSULE | Freq: Two times a day (BID) | ORAL | Status: DC
  Administered 2022-12-11 – 2022-12-12 (×3): 100 mg via ORAL
  Filled 2022-12-11 (×4): qty 1

## 2022-12-11 MED ORDER — 0.9 % SODIUM CHLORIDE (POUR BTL) OPTIME
TOPICAL | Status: DC | PRN
  Administered 2022-12-11: 1000 mL

## 2022-12-11 MED ORDER — ACETAMINOPHEN 500 MG PO TABS: 1000.0000 mg | ORAL_TABLET | Freq: Three times a day (TID) | ORAL | Status: AC | PRN

## 2022-12-11 MED ORDER — PHENYLEPHRINE HCL-NACL 20-0.9 MG/250ML-% IV SOLN
INTRAVENOUS | Status: AC
  Filled 2022-12-11: qty 250

## 2022-12-11 MED ORDER — PROPOFOL 10 MG/ML IV BOLUS
INTRAVENOUS | Status: AC
Start: 2022-12-11 — End: ?
  Filled 2022-12-11: qty 20

## 2022-12-11 MED ORDER — PHENYLEPHRINE 80 MCG/ML (10ML) SYRINGE FOR IV PUSH (FOR BLOOD PRESSURE SUPPORT)
PREFILLED_SYRINGE | INTRAVENOUS | Status: AC
  Filled 2022-12-11: qty 10

## 2022-12-11 MED ORDER — NITROGLYCERIN 0.4 MG SL SUBL: 0.4000 mg | SUBLINGUAL_TABLET | SUBLINGUAL | Status: DC | PRN

## 2022-12-11 MED ORDER — BUPIVACAINE LIPOSOME 1.3 % IJ SUSP: 20.0000 mL | Freq: Once | INTRAMUSCULAR | Status: DC

## 2022-12-11 MED ORDER — PHENOL 1.4 % MT LIQD: 1.0000 | OROMUCOSAL | Status: DC | PRN

## 2022-12-11 MED ORDER — OXYCODONE HCL 5 MG PO TABS: 5.0000 mg | ORAL_TABLET | ORAL | 0 refills | Status: AC | PRN

## 2022-12-11 MED ORDER — ONDANSETRON HCL 4 MG/2ML IJ SOLN: 4.0000 mg | Freq: Four times a day (QID) | INTRAMUSCULAR | Status: DC | PRN

## 2022-12-11 MED ORDER — AMISULPRIDE (ANTIEMETIC) 5 MG/2ML IV SOLN: 10.0000 mg | Freq: Once | INTRAVENOUS | Status: DC | PRN

## 2022-12-11 MED ORDER — LACTATED RINGERS IV SOLN: INTRAVENOUS | Status: AC

## 2022-12-11 MED ORDER — ACETAMINOPHEN 500 MG PO TABS
1000.0000 mg | ORAL_TABLET | Freq: Once | ORAL | Status: AC
  Administered 2022-12-11: 1000 mg via ORAL
  Filled 2022-12-11: qty 2

## 2022-12-11 MED ORDER — DEXAMETHASONE SODIUM PHOSPHATE 10 MG/ML IJ SOLN
8.0000 mg | Freq: Once | INTRAMUSCULAR | Status: AC
  Administered 2022-12-11: 8 mg via INTRAVENOUS

## 2022-12-11 MED ORDER — KETOROLAC TROMETHAMINE 15 MG/ML IJ SOLN
INTRAMUSCULAR | Status: AC
  Filled 2022-12-11: qty 1

## 2022-12-11 MED ORDER — METHOCARBAMOL 500 MG PO TABS: 500.0000 mg | ORAL_TABLET | Freq: Three times a day (TID) | ORAL | 0 refills | Status: AC | PRN

## 2022-12-11 MED ORDER — SODIUM CHLORIDE (PF) 0.9 % IJ SOLN
INTRAMUSCULAR | Status: DC | PRN
  Administered 2022-12-11: 80 mL

## 2022-12-11 MED ORDER — POVIDONE-IODINE 10 % EX SWAB: 2.0000 | Freq: Once | CUTANEOUS | Status: DC

## 2022-12-11 MED ORDER — FAMOTIDINE 20 MG PO TABS
40.0000 mg | ORAL_TABLET | Freq: Every day | ORAL | Status: DC
  Administered 2022-12-11: 40 mg via ORAL
  Filled 2022-12-11: qty 2

## 2022-12-11 MED ORDER — FENTANYL CITRATE PF 50 MCG/ML IJ SOSY
PREFILLED_SYRINGE | INTRAMUSCULAR | Status: AC
  Filled 2022-12-11: qty 1

## 2022-12-11 MED ORDER — SODIUM CHLORIDE 0.9 % IR SOLN
Status: DC | PRN
  Administered 2022-12-11: 3000 mL

## 2022-12-11 MED ORDER — ONDANSETRON HCL 4 MG/2ML IJ SOLN
INTRAMUSCULAR | Status: AC
  Filled 2022-12-11: qty 2

## 2022-12-11 MED ORDER — ONDANSETRON HCL 4 MG PO TABS: 4.0000 mg | ORAL_TABLET | Freq: Three times a day (TID) | ORAL | 0 refills | Status: AC | PRN

## 2022-12-11 MED ORDER — DEXAMETHASONE SODIUM PHOSPHATE 10 MG/ML IJ SOLN
INTRAMUSCULAR | Status: AC
  Filled 2022-12-11: qty 1

## 2022-12-11 MED ORDER — ORAL CARE MOUTH RINSE: 15.0000 mL | Freq: Once | OROMUCOSAL | Status: AC

## 2022-12-11 MED ORDER — POLYETHYLENE GLYCOL 3350 17 G PO PACK: 17.0000 g | PACK | Freq: Every day | ORAL | Status: DC | PRN

## 2022-12-11 MED ORDER — OXYCODONE HCL 5 MG PO TABS
5.0000 mg | ORAL_TABLET | ORAL | Status: DC | PRN
  Administered 2022-12-11 (×2): 5 mg via ORAL
  Filled 2022-12-11: qty 1

## 2022-12-11 MED ORDER — ONDANSETRON HCL 4 MG PO TABS: 4.0000 mg | ORAL_TABLET | Freq: Four times a day (QID) | ORAL | Status: DC | PRN

## 2022-12-11 MED ORDER — ISOPROPYL ALCOHOL 70 % SOLN
Status: DC | PRN
  Administered 2022-12-11: 1 via TOPICAL

## 2022-12-11 MED ORDER — FENTANYL CITRATE (PF) 100 MCG/2ML IJ SOLN
INTRAMUSCULAR | Status: AC
  Filled 2022-12-11: qty 2

## 2022-12-11 MED ORDER — ROPIVACAINE HCL 5 MG/ML IJ SOLN
INTRAMUSCULAR | Status: DC | PRN
  Administered 2022-12-11: 20 mL via PERINEURAL

## 2022-12-11 MED ORDER — HYDROMORPHONE HCL 1 MG/ML IJ SOLN: 0.5000 mg | INTRAMUSCULAR | Status: DC | PRN

## 2022-12-11 MED ORDER — CARVEDILOL 6.25 MG PO TABS
6.2500 mg | ORAL_TABLET | Freq: Two times a day (BID) | ORAL | Status: DC
  Administered 2022-12-11 – 2022-12-12 (×2): 6.25 mg via ORAL
  Filled 2022-12-11 (×2): qty 1

## 2022-12-11 MED ORDER — DIPHENHYDRAMINE HCL 12.5 MG/5ML PO ELIX: 12.5000 mg | ORAL_SOLUTION | ORAL | Status: DC | PRN

## 2022-12-11 MED ORDER — MIDAZOLAM HCL 2 MG/2ML IJ SOLN
INTRAMUSCULAR | Status: AC
  Filled 2022-12-11: qty 2

## 2022-12-11 MED ORDER — SURGIPHOR WOUND IRRIGATION SYSTEM - OPTIME
TOPICAL | Status: DC | PRN
  Administered 2022-12-11: 450 mL via TOPICAL

## 2022-12-11 MED ORDER — BUPIVACAINE-EPINEPHRINE 0.25% -1:200000 IJ SOLN
INTRAMUSCULAR | Status: AC
  Filled 2022-12-11: qty 1

## 2022-12-11 MED ORDER — CEFAZOLIN SODIUM-DEXTROSE 2-4 GM/100ML-% IV SOLN
2.0000 g | Freq: Four times a day (QID) | INTRAVENOUS | Status: AC
  Administered 2022-12-11 (×2): 2 g via INTRAVENOUS
  Filled 2022-12-11 (×2): qty 100

## 2022-12-11 MED ORDER — SODIUM CHLORIDE (PF) 0.9 % IJ SOLN
INTRAMUSCULAR | Status: AC
  Filled 2022-12-11: qty 30

## 2022-12-11 MED ORDER — ACETAMINOPHEN 500 MG PO TABS
1000.0000 mg | ORAL_TABLET | Freq: Four times a day (QID) | ORAL | Status: AC
  Administered 2022-12-11 (×3): 1000 mg via ORAL
  Filled 2022-12-11 (×4): qty 2

## 2022-12-11 MED ORDER — METHOCARBAMOL 500 MG PO TABS: 500.0000 mg | ORAL_TABLET | Freq: Four times a day (QID) | ORAL | Status: DC | PRN

## 2022-12-11 MED ORDER — APIXABAN 2.5 MG PO TABS: 2.5000 mg | ORAL_TABLET | Freq: Two times a day (BID) | ORAL | 0 refills | Status: DC

## 2022-12-11 MED ORDER — METHOCARBAMOL 1000 MG/10ML IJ SOLN: 500.0000 mg | Freq: Four times a day (QID) | INTRAMUSCULAR | Status: DC | PRN

## 2022-12-11 MED ORDER — ACETAMINOPHEN 325 MG PO TABS: 325.0000 mg | ORAL_TABLET | Freq: Four times a day (QID) | ORAL | Status: DC | PRN

## 2022-12-11 MED ORDER — KETOROLAC TROMETHAMINE 15 MG/ML IJ SOLN
7.5000 mg | Freq: Four times a day (QID) | INTRAMUSCULAR | Status: AC
  Administered 2022-12-11 – 2022-12-12 (×4): 7.5 mg via INTRAVENOUS
  Filled 2022-12-11 (×3): qty 1

## 2022-12-11 MED ORDER — FENTANYL CITRATE PF 50 MCG/ML IJ SOSY
25.0000 ug | PREFILLED_SYRINGE | INTRAMUSCULAR | Status: DC | PRN
  Administered 2022-12-11: 50 ug via INTRAVENOUS

## 2022-12-11 MED ORDER — BUPIVACAINE LIPOSOME 1.3 % IJ SUSP
INTRAMUSCULAR | Status: AC
Start: 2022-12-11 — End: ?
  Filled 2022-12-11: qty 20

## 2022-12-11 MED ORDER — PHENYLEPHRINE 80 MCG/ML (10ML) SYRINGE FOR IV PUSH (FOR BLOOD PRESSURE SUPPORT)
PREFILLED_SYRINGE | INTRAVENOUS | Status: DC | PRN
  Administered 2022-12-11 (×3): 80 ug via INTRAVENOUS

## 2022-12-11 MED ORDER — OXYCODONE HCL 5 MG PO TABS
ORAL_TABLET | ORAL | Status: AC
  Filled 2022-12-11: qty 1

## 2022-12-11 MED ORDER — LIDOCAINE HCL (PF) 2 % IJ SOLN
INTRAMUSCULAR | Status: AC
  Filled 2022-12-11: qty 5

## 2022-12-11 MED ORDER — PHENYLEPHRINE HCL-NACL 20-0.9 MG/250ML-% IV SOLN
INTRAVENOUS | Status: DC | PRN
Start: 2022-12-11 — End: 2022-12-11
  Administered 2022-12-11: 25 ug/min via INTRAVENOUS

## 2022-12-11 MED ORDER — SODIUM CHLORIDE 0.9 % IV SOLN: INTRAVENOUS | Status: DC

## 2022-12-11 MED ORDER — ROSUVASTATIN CALCIUM 10 MG PO TABS
10.0000 mg | ORAL_TABLET | Freq: Every day | ORAL | Status: DC
  Administered 2022-12-12: 10 mg via ORAL
  Filled 2022-12-11: qty 1

## 2022-12-11 MED ORDER — CEFAZOLIN SODIUM-DEXTROSE 2-4 GM/100ML-% IV SOLN
2.0000 g | INTRAVENOUS | Status: AC
  Administered 2022-12-11: 2 g via INTRAVENOUS
  Filled 2022-12-11: qty 100

## 2022-12-11 MED ORDER — LIDOCAINE HCL (CARDIAC) PF 100 MG/5ML IV SOSY
PREFILLED_SYRINGE | INTRAVENOUS | Status: DC | PRN
  Administered 2022-12-11: 60 mg via INTRATRACHEAL

## 2022-12-11 MED ORDER — MENTHOL 3 MG MT LOZG: 1.0000 | LOZENGE | OROMUCOSAL | Status: DC | PRN

## 2022-12-11 MED ORDER — APIXABAN 2.5 MG PO TABS
2.5000 mg | ORAL_TABLET | Freq: Two times a day (BID) | ORAL | Status: DC
  Administered 2022-12-12: 2.5 mg via ORAL
  Filled 2022-12-11: qty 1

## 2022-12-11 MED ORDER — FENTANYL CITRATE (PF) 100 MCG/2ML IJ SOLN
INTRAMUSCULAR | Status: DC | PRN
  Administered 2022-12-11 (×2): 50 ug via INTRAVENOUS

## 2022-12-11 MED ORDER — CHLORHEXIDINE GLUCONATE 0.12 % MT SOLN
15.0000 mL | Freq: Once | OROMUCOSAL | Status: AC
  Administered 2022-12-11: 15 mL via OROMUCOSAL

## 2022-12-11 MED ORDER — PROPOFOL 10 MG/ML IV BOLUS
INTRAVENOUS | Status: DC | PRN
  Administered 2022-12-11: 30 mg via INTRAVENOUS
  Administered 2022-12-11: 150 mg via INTRAVENOUS

## 2022-12-11 MED ORDER — CELECOXIB 100 MG PO CAPS: 100.0000 mg | ORAL_CAPSULE | Freq: Two times a day (BID) | ORAL | 0 refills | Status: AC

## 2022-12-11 SURGICAL SUPPLY — 53 items
BAG COUNTER SPONGE SURGICOUNT (BAG) IMPLANT
BLADE SAG 18X100X1.27 (BLADE) ×1 IMPLANT
BLADE SAW SAG 35X64 .89 (BLADE) ×1 IMPLANT
BNDG COHESIVE 3X5 TAN ST LF (GAUZE/BANDAGES/DRESSINGS) ×1 IMPLANT
BNDG ELASTIC 6X10 VLCR STRL LF (GAUZE/BANDAGES/DRESSINGS) ×1 IMPLANT
BOWL SMART MIX CTS (DISPOSABLE) ×1 IMPLANT
CEMENT BONE R 1X40 (Cement) IMPLANT
CEMENT BONE REFOBACIN R1X40 US (Cement) IMPLANT
CHLORAPREP W/TINT 26 (MISCELLANEOUS) ×2 IMPLANT
COMP FEM CMT PERSONA SZ10 RT (Joint) ×1 IMPLANT
COMPONENT FEM CMT PRNSA SZ10RT (Joint) IMPLANT
COVER SURGICAL LIGHT HANDLE (MISCELLANEOUS) ×1 IMPLANT
CUFF TRNQT CYL 34X4.125X (TOURNIQUET CUFF) ×1 IMPLANT
DERMABOND ADVANCED .7 DNX12 (GAUZE/BANDAGES/DRESSINGS) ×1 IMPLANT
DRAPE INCISE IOBAN 85X60 (DRAPES) ×1 IMPLANT
DRAPE SHEET LG 3/4 BI-LAMINATE (DRAPES) ×1 IMPLANT
DRAPE U-SHAPE 47X51 STRL (DRAPES) ×1 IMPLANT
DRSG AQUACEL AG ADV 3.5X10 (GAUZE/BANDAGES/DRESSINGS) ×1 IMPLANT
ELECT REM PT RETURN 15FT ADLT (MISCELLANEOUS) ×1 IMPLANT
GAUZE SPONGE 4X4 12PLY STRL (GAUZE/BANDAGES/DRESSINGS) ×1 IMPLANT
GLOVE BIO SURGEON STRL SZ 6.5 (GLOVE) ×2 IMPLANT
GLOVE BIOGEL PI IND STRL 6.5 (GLOVE) ×1 IMPLANT
GLOVE BIOGEL PI IND STRL 8 (GLOVE) ×1 IMPLANT
GLOVE SURG ORTHO 8.0 STRL STRW (GLOVE) ×2 IMPLANT
GOWN STRL REUS W/ TWL XL LVL3 (GOWN DISPOSABLE) ×2 IMPLANT
HOLDER FOLEY CATH W/STRAP (MISCELLANEOUS) ×1 IMPLANT
HOOD PEEL AWAY T7 (MISCELLANEOUS) ×3 IMPLANT
INSERT TIBIA KNEE RIGHT 10 (Joint) IMPLANT
KIT TURNOVER KIT A (KITS) IMPLANT
MANIFOLD NEPTUNE II (INSTRUMENTS) ×1 IMPLANT
MARKER SKIN DUAL TIP RULER LAB (MISCELLANEOUS) ×1 IMPLANT
NS IRRIG 1000ML POUR BTL (IV SOLUTION) ×1 IMPLANT
PACK TOTAL KNEE CUSTOM (KITS) ×1 IMPLANT
PIN DRILL HDLS TROCAR 75 4PK (PIN) IMPLANT
SCREW HEADED 33MM KNEE (MISCELLANEOUS) IMPLANT
SET HNDPC FAN SPRY TIP SCT (DISPOSABLE) ×1 IMPLANT
SOLUTION IRRIG SURGIPHOR (IV SOLUTION) IMPLANT
SPIKE FLUID TRANSFER (MISCELLANEOUS) ×1 IMPLANT
STEM POLY PAT PLY 35M KNEE (Knees) IMPLANT
STEM TIBIA 5 DEG SZ F R KNEE (Knees) IMPLANT
STRIP CLOSURE SKIN 1/2X4 (GAUZE/BANDAGES/DRESSINGS) ×1 IMPLANT
SUT MNCRL AB 3-0 PS2 18 (SUTURE) ×1 IMPLANT
SUT STRATAFIX 0 PDS 27 VIOLET (SUTURE) ×1
SUT STRATAFIX 14 PDO 48 VLT (SUTURE) ×1 IMPLANT
SUT STRATAFIX PDO 1 14 VIOLET (SUTURE) ×1
SUT VIC AB 2-0 CT2 27 (SUTURE) ×2 IMPLANT
SUTURE STRATFX 0 PDS 27 VIOLET (SUTURE) ×1 IMPLANT
SYR 50ML LL SCALE MARK (SYRINGE) ×1 IMPLANT
TIBIA STEM 5 DEG SZ F R KNEE (Knees) ×1 IMPLANT
TRAY FOLEY MTR SLVR 14FR STAT (SET/KITS/TRAYS/PACK) IMPLANT
TUBE SUCTION HIGH CAP CLEAR NV (SUCTIONS) ×1 IMPLANT
UNDERPAD 30X36 HEAVY ABSORB (UNDERPADS AND DIAPERS) ×1 IMPLANT
WRAP KNEE MAXI GEL POST OP (GAUZE/BANDAGES/DRESSINGS) IMPLANT

## 2022-12-11 NOTE — Op Note (Signed)
DATE OF SURGERY:  12/11/2022 TIME: 10:57 AM  PATIENT NAME:  Jesse Mann   AGE: 82 y.o.    PRE-OPERATIVE DIAGNOSIS: End-stage right knee osteoarthritis  POST-OPERATIVE DIAGNOSIS:  Same  PROCEDURE: Right total Knee Arthroplasty  SURGEON:  Shiraz Bastyr A Fawzi Melman, MD   ASSISTANT: Kathie Dike, PA-C, present and scrubbed throughout the case, critical for assistance with exposure, retraction, instrumentation, and closure.   OPERATIVE IMPLANTS:  Cemented Zimmer persona size 10 standard femur, size F right tibial baseplate, 35 mm all poly cemented patella, 10 mm MC polyethylene insert Implant Name Type Inv. Item Serial No. Manufacturer Lot No. LRB No. Used Action  CEMENT BONE R 1X40 - [id number] Cement CEMENT BONE R 1X40  [employer]IMMER [employer](ORTH,TRAU,BIO,SG) [id number] Right 1 Implanted  CEMENT BONE R 1X40 - [id number] Cement CEMENT BONE R 1X40  [employer]IMMER [employer](ORTH,TRAU,BIO,SG) [id number] Right 1 Implanted  COMP FEM CMT PERSONA SZ10 RT - [id number] Joint COMP FEM CMT PERSONA SZ10 RT  ZIMMER [employer]ON(ORTH,TRAU,BIO,SG) [id number] Right 1 Implanted  TIBIA STEM 5 DEG SZ F R KNEE - [id number] Knees TIBIA STEM 5 DEG SZ F R KNEE  ZIMMER [employer]ON(ORTH,TRAU,BIO,SG) [id number] Right 1 Implanted  STEM POLY PAT PLY 18M KNEE - [id number] Knees STEM POLY PAT PLY 18M KNEE  ZIMMER [employer]ON(ORTH,TRAU,BIO,SG) [id number] Right 1 Implanted  INSERT TIBIA KNEE RIGHT 10 - [id number] Joint INSERT TIBIA KNEE RIGHT 10  ZIMMER [employer]ON(ORTH,TRAU,BIO,SG) [id number] Right 1 Implanted      PREOPERATIVE INDICATIONS:  Jesse Mann is a 82 y.o. year old male with end stage bone on bone degenerative arthritis of the knee who failed conservative treatment, including injections, antiinflammatories, activity modification, and assistive devices, and had significant impairment of their activities of daily living, and elected for Total Knee Arthroplasty.   The risks, benefits, and alternatives were discussed at length including but not limited to  the risks of infection, bleeding, nerve injury, stiffness, blood clots, the need for revision surgery, cardiopulmonary complications, among others, and they were willing to proceed.   ESTIMATED BLOOD LOSS: 50cc  OPERATIVE DESCRIPTION:   Once adequate anesthesia was induced, preoperative antibiotics, 2 gm of ancef,1 gm of Tranexamic Acid, and 8 mg of Decadron administered, the patient was positioned supine with a right thigh tourniquet placed.  The right lower extremity was prepped and draped in sterile fashion.  A time-  out was performed identifying the patient, planned procedure, and the appropriate extremity.     The leg was  exsanguinated, tourniquet elevated to 250 mmHg.  A midline incision was  made followed by median parapatellar arthrotomy. Anterior horn of the medial meniscus was released and resected. A medial release was performed, the infrapatellar fat pad was resected with care taken to protect the patellar tendon. The suprapatellar fat was removed to exposed the distal anterior femur. The anterior horn of the lateral meniscus and ACL were released.    Following initial  exposure, I first started with the femur  The femoral  canal was opened with a drill, canal was suctioned to try to prevent fat emboli.  An  intramedullary rod was passed set at 5 degrees valgus, 10 mm. The distal femur was resected.  Following this resection, the tibia was  subluxated anteriorly.  Using the extramedullary guide, 10 mm of bone was resected off   the proximal lateral tibia.  We confirmed the gap would be  stable medially and laterally with a size 10mm spacer block as well as confirmed that the tibial cut was perpendicular in the coronal plane,  checking with an alignment rod.    Once this was done, the posterior femoral referencing femoral sizer was placed under to the posterior condyles with 3 degrees of external rotational which was parallel to the transepicondylar axis and perpendicular to [employer]. The femur was sized to be a size 10 in the anterior-  posterior dimension. The  anterior, posterior, and  chamfer cuts were made without difficulty nor   notching making certain that I was along the anterior cortex to help  with flexion gap stability. Next a laminar spreader was placed with the knee in flexion and the medial lateral menisci were resected.  5 cc of the Exparel mixture was injected in the medial side of the back of the knee and 3 cc in the lateral side.  1/2 inch curved osteotome was used to resect posterior osteophyte that was then removed with a pituitary rongeur.       At this point, the tibia was sized to be a size F.  The size F tray was  then pinned in position. Trial reduction was now carried with a 10 femur, F tibia, a 10 mm MC insert.  The knee had full extension and was stable to varus valgus stress in extension.  The knee was slightly tight in flexion and the PCL was partially released.   Attention was next directed to the patella.  Precut  measurement was noted to be 24 mm.  I resected down to 14 mm and used a  35mm patellar button to restore patellar height as well as cover the cut surface.     The patella lug holes were drilled and a 35mm patella poly trial was placed.    The knee was brought to full extension with good flexion stability with the patella tracking through the trochlea without application of pressure.     Next the femoral component was again assessed and determined to be seated and appropriately lateralized.  The femoral lug holes were drilled.  The femoral component was then removed. Tibial component was again assessed and felt to be seated and appropriately rotated with the medial third of the tubercle. The tibia was then drilled, and keel punched.     Final components were  opened and cement was mixed.      Final implants were then  cemented onto cleaned and dried cut surfaces of bone with the knee brought to extension with a 10 mm MC poly.  The  knee was irrigated with sterile Betadine diluted in saline as well as pulse lavage normal saline. The synovial lining was  then injected a dilute Exparel with 30cc of 0.25% marcaine with epinephrine.         Once the cement had fully cured, excess cement was removed throughout the knee.  I confirmed that I was satisfied with the range of motion and stability, and the final 10mm MC poly insert was chosen.  It was placed into the knee.         The tourniquet had been let down at 63 minutes.  No significant hemostasis was required.  The medial parapatellar arthrotomy was then reapproximated using #1 Stratafix sutures with the knee  in flexion.  The remaining wound was closed with 0 stratafix, 2-0 Vicryl, and running 3-0 Monocryl. The knee was cleaned, dried, dressed sterilely using Dermabond and   Aquacel dressing.  The patient was then brought to recovery room in stable condition, tolerating the procedure  well. There were no complications.  Post op recs: WB: WBAT Abx: ancef Imaging: PACU xrays DVT prophylaxis: Eliquis 2.5 mg twice daily x 4 weeks given cardiac history Follow up: 2 weeks after surgery for a wound check with Dr. Blanchie Dessert at [facility].  Address: [address], [place], [place] [address]  Office Phone: [phone]  Weber Cooks, MD Orthopaedic Surgery

## 2022-12-11 NOTE — Progress Notes (Signed)
Orthopedic Tech Progress Note Patient Details:  Jesse Mann 04-02-1940 [id number]  Ortho Devices Type of Ortho Device: Bone foam zero knee Ortho Device/Splint Location: right Ortho Device/Splint Interventions: Ordered, Application, Adjustment   Post Interventions Patient Tolerated: Well Instructions Provided: Adjustment of device, Care of device  Kizzie Fantasia 12/11/2022, 1:50 PM

## 2022-12-11 NOTE — Anesthesia Postprocedure Evaluation (Signed)
Anesthesia Post Note  Patient: Jesse Mann  Procedure(s) Performed: TOTAL KNEE ARTHROPLASTY (Right: Knee)     Patient location during evaluation: PACU Anesthesia Type: General Level of consciousness: awake and alert Pain management: pain level controlled Vital Signs Assessment: post-procedure vital signs reviewed and stable Respiratory status: spontaneous breathing, nonlabored ventilation, respiratory function stable and patient connected to nasal cannula oxygen Cardiovascular status: blood pressure returned to baseline and stable Postop Assessment: no apparent nausea or vomiting Anesthetic complications: no  No notable events documented.  Last Vitals:  Vitals:   12/11/22 1245 12/11/22 1305  BP: 137/83 138/85  Pulse: (!) 59 62  Resp: 15 18  Temp:    SpO2: 100% 100%    Last Pain:  Vitals:   12/11/22 1351  TempSrc:   PainSc: 5                  Kennieth Rad

## 2022-12-11 NOTE — Anesthesia Procedure Notes (Signed)
Anesthesia Regional Block: Adductor canal block   Pre-Anesthetic Checklist: , timeout performed,  Correct Patient, Correct Site, Correct Laterality,  Correct Procedure, Correct Position, site marked,  Risks and benefits discussed,  Surgical consent,  Pre-op evaluation,  At surgeon's request and post-op pain management  Laterality: Right  Prep: chloraprep       Needles:  Injection technique: Single-shot  Needle Type: Echogenic Needle     Needle Length: 9cm  Needle Gauge: 21     Additional Needles:   Procedures:,,,, ultrasound used (permanent image in chart),,    Narrative:  Start time: 12/11/2022 8:03 AM End time: 12/11/2022 8:08 AM Injection made incrementally with aspirations every 5 mL.  Performed by: Personally  Anesthesiologist: Marcene Duos, MD

## 2022-12-11 NOTE — Evaluation (Signed)
Physical Therapy Evaluation Patient Details Name: Jesse Mann MRN: [id number] DOB: 1940/09/10 Today's Date: 12/11/2022  History of Present Illness  82 yo male presents to therapy s/p R TKA on 12/11/2022 due to failure of conservative measures. Pt PMH includes but is not limited to: defibrillator in situ, Mi, CHF, CAD, GERD, prostate ca s/p prostatectomy, HTN, HLD, OSA, R BBB, and back surgery.  Clinical Impression   Jesse Mann is a 82 y.o. male  POD 0 s/p R TKA. Patient reports mod I with mobility at baseline. Patient is now limited by functional impairments (see PT problem list below) and requires min A for bed mobility and mod A for transfers. Patient was unable to safely ambulate at time of eval due to slow regression of anesthesia.   Patient instructed in exercise to facilitate ROM and circulation to manage edema. Patient will benefit from continued skilled PT interventions to address impairments and progress towards PLOF. Acute PT will follow to progress mobility and stair training in preparation for safe discharge home with family support and OPPT services.       If plan is discharge home, recommend the following: A little help with walking and/or transfers;A little help with bathing/dressing/bathroom;Assistance with cooking/housework;Assist for transportation;Help with stairs or ramp for entrance   Can travel by private vehicle        Equipment Recommendations None recommended by PT  Recommendations for Other Services       Functional Status Assessment Patient has had a recent decline in their functional status and demonstrates the ability to make significant improvements in function in a reasonable and predictable amount of time.     Precautions / Restrictions Precautions Precautions: Knee;Fall Restrictions Weight Bearing Restrictions: No      Mobility  Bed Mobility Overal bed mobility: Needs Assistance Bed Mobility: Supine to Sit     Supine to sit: Min  assist, HOB elevated, Used rails     General bed mobility comments: min A to initiate R LE to EOB then pt able to complete task with CGA and cues    Transfers Overall transfer level: Needs assistance Equipment used: Rolling walker (2 wheels) Transfers: Sit to/from Stand, Bed to chair/wheelchair/BSC Sit to Stand: Mod assist, From elevated surface Stand pivot transfers: Mod assist         General transfer comment: pt indicated R LE abn sensation and PT noted decreased R LE motor control, coordination and strength attributed to slow regression of anesthesia impacting pt safety and stabiltiy with transfer and gait tasks with R LE weight acceptance. mod A for sit to stand from EOB and mod/max A for SPT with cues for use of B UE to offload R LE in stance phase to recliner    Ambulation/Gait               General Gait Details: unable to safely asses gait at time of eval due to slow regression of anesthesia.  Stairs            Wheelchair Mobility     Tilt Bed    Modified Rankin (Stroke Patients Only)       Balance Overall balance assessment: Needs assistance, History of Falls (12/3 fall in bathtub) Sitting-balance support: Feet supported Sitting balance-Leahy Scale: Good     Standing balance support: Bilateral upper extremity supported, During functional activity, Reliant on assistive device for balance Standing balance-Leahy Scale: Zero Standing balance comment: min A to maintain static standing at RW with B UE support  Pertinent Vitals/Pain Pain Assessment Pain Assessment: 0-10 Pain Score: 4  Pain Location: R knee Pain Descriptors / Indicators: Aching, Constant, Discomfort, Dull, Grimacing, Operative site guarding Pain Intervention(s): Limited activity within patient's tolerance, Monitored during session, Premedicated before session, Repositioned, Ice applied    Home Living Family/patient expects to be discharged to::  Private residence Living Arrangements: Spouse/significant other Available Help at Discharge: Family (daughter will assist at time of d/c) Type of Home: House Home Access: Stairs to enter;Ramped entrance Entrance Stairs-Rails: None Entrance Stairs-Number of Steps: 1 Alternate Level Stairs-Number of Steps: flight Home Layout: Two level;Bed/bath upstairs Home Equipment: Rolling Walker (2 wheels);Rollator (4 wheels);Cane - single point Additional Comments: pt is primary caregiver for wife whom is disabled from long covid    Prior Function Prior Level of Function : Independent/Modified Independent;Driving             Mobility Comments: mod I with use of SPC for all ADLs, self care tasks and IADLs       Extremity/Trunk Assessment        Lower Extremity Assessment Lower Extremity Assessment: RLE deficits/detail RLE Deficits / Details: ankle DF/PF 5/5; AA for SLR, pt required AA for SAQ and able to perform quad and glute sets RLE Sensation: decreased light touch;decreased proprioception (B LE R > L)    Cervical / Trunk Assessment Cervical / Trunk Assessment: Back Surgery  Communication   Communication Communication: No apparent difficulties  Cognition Arousal: Alert Behavior During Therapy: WFL for tasks assessed/performed Overall Cognitive Status: Within Functional Limits for tasks assessed                                          General Comments      Exercises Total Joint Exercises Ankle Circles/Pumps: AROM, Both, 10 reps   Assessment/Plan    PT Assessment Patient needs continued PT services  PT Problem List Decreased strength;Decreased range of motion;Decreased activity tolerance;Decreased balance;Decreased mobility;Decreased coordination;Pain       PT Treatment Interventions DME instruction;Gait training;Stair training;Functional mobility training;Therapeutic activities;Therapeutic exercise;Balance training;Neuromuscular  re-education;Patient/family education;Modalities    PT Goals (Current goals can be found in the Care Plan section)  Acute Rehab PT Goals Patient Stated Goal: get L TKA, housework and rake the yard PT Goal Formulation: With patient Time For Goal Achievement: 12/25/22 Potential to Achieve Goals: Good    Frequency 7X/week     Co-evaluation               AM-PAC PT "6 Clicks" Mobility  Outcome Measure Help needed turning from your back to your side while in a flat bed without using bedrails?: A Little Help needed moving from lying on your back to sitting on the side of a flat bed without using bedrails?: A Little Help needed moving to and from a bed to a chair (including a wheelchair)?: A Lot Help needed standing up from a chair using your arms (e.g., wheelchair or bedside chair)?: A Lot Help needed to walk in hospital room?: Total Help needed climbing 3-5 steps with a railing? : Total 6 Click Score: 12    End of Session Equipment Utilized During Treatment: Gait belt Activity Tolerance: Treatment limited secondary to medical complications (Comment) (slow regression of anesthesia) Patient left: in chair;with call bell/phone within reach;with family/visitor present Nurse Communication: Mobility status PT Visit Diagnosis: Unsteadiness on feet (R26.81);Other abnormalities of gait and mobility (R26.89);Muscle weakness (generalized) (  M62.81);History of falling (Z91.81);Difficulty in walking, not elsewhere classified (R26.2);Pain Pain - Right/Left: Right Pain - part of body: Knee;Leg    Time: 1610-9604 PT Time Calculation (min) (ACUTE ONLY): 42 min   Charges:   PT Evaluation $PT Eval Low Complexity: 1 Low PT Treatments $Therapeutic Activity: 23-37 mins PT General Charges $$ ACUTE PT VISIT: 1 Visit         Johnny Bridge, PT Acute Rehab   Jacqualyn Posey 12/11/2022, 6:02 PM

## 2022-12-11 NOTE — Anesthesia Procedure Notes (Signed)
Procedure Name: LMA Insertion Date/Time: 12/11/2022 9:23 AM  Performed by: Dennison Nancy, CRNAPre-anesthesia Checklist: Patient identified, Emergency Drugs available, Suction available, Patient being monitored and Timeout performed Patient Re-evaluated:Patient Re-evaluated prior to induction Oxygen Delivery Method: Circle system utilized Preoxygenation: Pre-oxygenation with 100% oxygen Induction Type: IV induction LMA: LMA inserted LMA Size: 4.0 Tube secured with: Tape Dental Injury: Teeth and Oropharynx as per pre-operative assessment

## 2022-12-11 NOTE — Plan of Care (Signed)
  Problem: Education: Goal: Knowledge of General Education information will improve Description: Including pain rating scale, medication(s)/side effects and non-pharmacologic comfort measures Outcome: Progressing   Problem: Activity: Goal: Risk for activity intolerance will decrease Outcome: Progressing   Problem: Coping: Goal: Level of anxiety will decrease Outcome: Progressing   Problem: Pain Management: Goal: General experience of comfort will improve Outcome: Progressing   Problem: Education: Goal: Knowledge of the prescribed therapeutic regimen will improve Outcome: Progressing   Problem: Activity: Goal: Ability to avoid complications of mobility impairment will improve Outcome: Progressing   Problem: Pain Management: Goal: Pain level will decrease with appropriate interventions Outcome: Progressing

## 2022-12-11 NOTE — Interval H&P Note (Signed)

## 2022-12-11 NOTE — Discharge Instructions (Addendum)
INSTRUCTIONS AFTER JOINT REPLACEMENT   Remove items at home which could result in a fall. This includes throw rugs or furniture in walking pathways ICE to the affected joint every three hours while awake for 30 minutes at a time, for at least the first 3-5 days, and then as needed for pain and swelling.  Continue to use ice for pain and swelling. You may notice swelling that will progress down to the foot and ankle.  This is normal after surgery.  Elevate your leg when you are not up walking on it.   Continue to use the breathing machine you got in the hospital (incentive spirometer) which will help keep your temperature down.  It is common for your temperature to cycle up and down following surgery, especially at night when you are not up moving around and exerting yourself.  The breathing machine keeps your lungs expanded and your temperature down.  DIET:  As you were doing prior to hospitalization, we recommend a well-balanced diet.  DRESSING / WOUND CARE / SHOWERING:  Keep the surgical dressing until follow up.  The dressing is water proof, so you can shower without any extra covering.  IF THE DRESSING FALLS OFF or the wound gets wet inside, change the dressing with sterile gauze.  Please use good hand washing techniques before changing the dressing.  Do not use any lotions or creams on the incision until instructed by your surgeon.    ACTIVITY  Increase activity slowly as tolerated, but follow the weight bearing instructions below.   No driving for 6 weeks or until further direction given by your physician.  You cannot drive while taking narcotics.  No lifting or carrying greater than 10 lbs. until further directed by your surgeon. Avoid periods of inactivity such as sitting longer than an hour when not asleep. This helps prevent blood clots.  You may return to work once you are authorized by your doctor.   WEIGHT BEARING: Weight bearing as tolerated with assist device (walker, cane, etc) as  directed, use it as long as suggested by your surgeon or therapist, typically at least 4-6 weeks.  EXERCISES  Results after joint replacement surgery are often greatly improved when you follow the exercise, range of motion and muscle strengthening exercises prescribed by your doctor. Safety measures are also important to protect the joint from further injury. Any time any of these exercises cause you to have increased pain or swelling, decrease what you are doing until you are comfortable again and then slowly increase them. If you have problems or questions, call your caregiver or physical therapist for advice.   Rehabilitation is important following a joint replacement. After just a few days of immobilization, the muscles of the leg can become weakened and shrink (atrophy).  These exercises are designed to build up the tone and strength of the thigh and leg muscles and to improve motion. Often times heat used for twenty to thirty minutes before working out will loosen up your tissues and help with improving the range of motion but do not use heat for the first two weeks following surgery (sometimes heat can increase post-operative swelling).   These exercises can be done on a training (exercise) mat, on the floor, on a table or on a bed. Use whatever works the best and is most comfortable for you.    Use music or television while you are exercising so that the exercises are a pleasant break in your day. This will make your life  better with the exercises acting as a break in your routine that you can look forward to.   Perform all exercises about fifteen times, three times per day or as directed.  You should exercise both the operative leg and the other leg as well.  Exercises include:   Quad Sets - Tighten up the muscle on the front of the thigh (Quad) and hold for 5-10 seconds.   Straight Leg Raises - With your knee straight (if you were given a brace, keep it on), lift the leg to 60 degrees, hold  for 3 seconds, and slowly lower the leg.  Perform this exercise against resistance later as your leg gets stronger.  Leg Slides: Lying on your back, slowly slide your foot toward your buttocks, bending your knee up off the floor (only go as far as is comfortable). Then slowly slide your foot back down until your leg is flat on the floor again.  Angel Wings: Lying on your back spread your legs to the side as far apart as you can without causing discomfort.  Hamstring Strength:  Lying on your back, push your heel against the floor with your leg straight by tightening up the muscles of your buttocks.  Repeat, but this time bend your knee to a comfortable angle, and push your heel against the floor.  You may put a pillow under the heel to make it more comfortable if necessary.   A rehabilitation program following joint replacement surgery can speed recovery and prevent re-injury in the future due to weakened muscles. Contact your doctor or a physical therapist for more information on knee rehabilitation.   CONSTIPATION:  Constipation is defined medically as fewer than three stools per week and severe constipation as less than one stool per week.  Even if you have a regular bowel pattern at home, your normal regimen is likely to be disrupted due to multiple reasons following surgery.  Combination of anesthesia, postoperative narcotics, change in appetite and fluid intake all can affect your bowels.   YOU MUST use at least one of the following options; they are listed in order of increasing strength to get the job done.  They are all available over the counter, and you may need to use some, POSSIBLY even all of these options:    Drink plenty of fluids (prune juice may be helpful) and high fiber foods Colace 100 mg by mouth twice a day  Senokot for constipation as directed and as needed Dulcolax (bisacodyl), take with full glass of water  Miralax (polyethylene glycol) once or twice a day as needed.  If you  have tried all these things and are unable to have a bowel movement in the first 3-4 days after surgery call either your surgeon or your primary doctor.    If you experience loose stools or diarrhea, hold the medications until you stool forms back up.  If your symptoms do not get better within 1 week or if they get worse, check with your doctor.  If you experience "the worst abdominal pain ever" or develop nausea or vomiting, please contact the office immediately for further recommendations for treatment.  ITCHING:  If you experience itching with your medications, try taking only a single pain pill, or even half a pain pill at a time.  You can also use Benadryl over the counter for itching or also to help with sleep.   TED HOSE STOCKINGS:  Use stockings on both legs until for at least 2 weeks or  as directed by physician office. They may be removed at night for sleeping.  MEDICATIONS:  See your medication summary on the "After Visit Summary" that nursing will review with you.  You may have some home medications which will be placed on hold until you complete the course of blood thinner medication.  It is important for you to complete the blood thinner medication as prescribed.  Blood clot prevention (DVT Prophylaxis): After surgery you are at an increased risk for a blood clot. you were prescribed a blood thinner, Eliquis 2.5 mg, to be taken twice daily for a total of 4 weeks from surgery to help reduce your risk of getting a blood clot.  Signs of a pulmonary embolus (blood clot in the lungs) include sudden short of breath, feeling lightheaded or dizzy, chest pain with a deep breath, rapid pulse rapid breathing.  Signs of a blood clot in your arms or legs include new unexplained swelling and cramping, warm, red or darkened skin around the painful area.  Please call the office or 911 right away if these signs or symptoms develop.  PRECAUTIONS:   If you experience chest pain or shortness of breath - call  911 immediately for transfer to the hospital emergency department.   If you develop a fever greater that 101 F, purulent drainage from wound, increased redness or drainage from wound, foul odor from the wound/dressing, or calf pain - CONTACT YOUR SURGEON.                                                   FOLLOW-UP APPOINTMENTS:  If you do not already have a post-op appointment, please call the office for an appointment to be seen by your surgeon.  Guidelines for how soon to be seen are listed in your "After Visit Summary", but are typically between 2-3 weeks after surgery.  If you have a specialized bandage, you may be told to follow up 1 week after surgery.  OTHER INSTRUCTIONS:  Knee Replacement:  Do not place pillow under knee, focus on keeping the knee straight while resting.  Place foam block, curve side up under heel at all times except when walking.  DO NOT modify, tear, cut, or change the foam block in any way.  POST-OPERATIVE OPIOID TAPER INSTRUCTIONS: It is important to wean off of your opioid medication as soon as possible. If you do not need pain medication after your surgery it is ok to stop day one. Opioids include: Codeine, Hydrocodone(Norco, Vicodin), Oxycodone(Percocet, oxycontin) and hydromorphone amongst others.  Long term and even short term use of opiods can cause: Increased pain response Dependence Constipation Depression Respiratory depression And more.  Withdrawal symptoms can include Flu like symptoms Nausea, vomiting And more Techniques to manage these symptoms Hydrate well Eat regular healthy meals Stay active Use relaxation techniques(deep breathing, meditating, yoga) Do Not substitute Alcohol to help with tapering If you have been on opioids for less than two weeks and do not have pain than it is ok to stop all together.  Plan to wean off of opioids This plan should start within one week post op of your joint replacement. Maintain the same interval or time  between taking each dose and first decrease the dose.  Cut the total daily intake of opioids by one tablet each day Next start to increase the time between  doses. The last dose that should be eliminated is the evening dose.   MAKE SURE YOU:  Understand these instructions.  Get help right away if you are not doing well or get worse.    Thank you for letting us be a part of your medical care team.  It is a privilege we respect greatly.  We hope these instructions will help you stay on track for a fast and full recovery!   Information on my medicine - ELIQUIS (apixaban)  This medication education was reviewed with me or my healthcare representative as part of my discharge preparation.  The pharmacist that spoke with me during my hospital stay was:    Why was Eliquis prescribed for you? Eliquis was prescribed for you to reduce the risk of blood clots forming after orthopedic surgery.    What do You need to know about Eliquis? Take your Eliquis TWICE DAILY - one tablet in the morning and one tablet in the evening with or without food.  It would be best to take the dose about the same time each day.  If you have difficulty swallowing the tablet whole please discuss with your pharmacist how to take the medication safely.  Take Eliquis exactly as prescribed by your doctor and DO NOT stop taking Eliquis without talking to the doctor who prescribed the medication.  Stopping without other medication to take the place of Eliquis may increase your risk of developing a clot.  After discharge, you should have regular check-up appointments with your healthcare provider that is prescribing your Eliquis.  What do you do if you miss a dose? If a dose of ELIQUIS is not taken at the scheduled time, take it as soon as possible on the same day and twice-daily administration should be resumed.  The dose should not be doubled to make up for a missed dose.  Do not take more than one tablet of ELIQUIS at  the same time.  Important Safety Information A possible side effect of Eliquis is bleeding. You should call your healthcare provider right away if you experience any of the following: Bleeding from an injury or your nose that does not stop. Unusual colored urine (red or dark brown) or unusual colored stools (red or black). Unusual bruising for unknown reasons. A serious fall or if you hit your head (even if there is no bleeding).  Some medicines may interact with Eliquis and might increase your risk of bleeding or clotting while on Eliquis. To help avoid this, consult your healthcare provider or pharmacist prior to using any new prescription or non-prescription medications, including herbals, vitamins, non-steroidal anti-inflammatory drugs (NSAIDs) and supplements.  This website has more information on Eliquis (apixaban): http://www.eliquis.com/eliquis/home

## 2022-12-11 NOTE — Transfer of Care (Signed)
Immediate Anesthesia Transfer of Care Note  Patient: Jesse Mann  Procedure(s) Performed: TOTAL KNEE ARTHROPLASTY (Right: Knee)  Patient Location: PACU  Anesthesia Type:General  Level of Consciousness: awake, alert , oriented, and patient cooperative  Airway & Oxygen Therapy: Patient Spontanous Breathing and Patient connected to face mask oxygen  Post-op Assessment: Report given to RN and Post -op Vital signs reviewed and stable  Post vital signs: Reviewed and stable  Last Vitals:  Vitals Value Taken Time  BP 157/93 12/11/22 1135  Temp 36.4 C 12/11/22 1135  Pulse 62 12/11/22 1140  Resp 14 12/11/22 1140  SpO2 90 % 12/11/22 1140  Vitals shown include unfiled device data.  Last Pain:  Vitals:   12/11/22 0724  TempSrc:   PainSc: 0-No pain         Complications: No notable events documented.

## 2022-12-12 ENCOUNTER — Encounter (HOSPITAL_COMMUNITY): Payer: Self-pay | Admitting: Orthopedic Surgery

## 2022-12-12 DIAGNOSIS — Z87891 Personal history of nicotine dependence: Secondary | ICD-10-CM | POA: Diagnosis not present

## 2022-12-12 DIAGNOSIS — Z7982 Long term (current) use of aspirin: Secondary | ICD-10-CM | POA: Diagnosis not present

## 2022-12-12 DIAGNOSIS — Z950 Presence of cardiac pacemaker: Secondary | ICD-10-CM | POA: Diagnosis not present

## 2022-12-12 DIAGNOSIS — I509 Heart failure, unspecified: Secondary | ICD-10-CM | POA: Diagnosis not present

## 2022-12-12 DIAGNOSIS — N183 Chronic kidney disease, stage 3 unspecified: Secondary | ICD-10-CM | POA: Diagnosis not present

## 2022-12-12 DIAGNOSIS — I130 Hypertensive heart and chronic kidney disease with heart failure and stage 1 through stage 4 chronic kidney disease, or unspecified chronic kidney disease: Secondary | ICD-10-CM | POA: Diagnosis not present

## 2022-12-12 DIAGNOSIS — M1711 Unilateral primary osteoarthritis, right knee: Secondary | ICD-10-CM | POA: Diagnosis not present

## 2022-12-12 DIAGNOSIS — Z8546 Personal history of malignant neoplasm of prostate: Secondary | ICD-10-CM | POA: Diagnosis not present

## 2022-12-12 DIAGNOSIS — Z79899 Other long term (current) drug therapy: Secondary | ICD-10-CM | POA: Diagnosis not present

## 2022-12-12 LAB — CBC
HCT: 39.1 % (ref 39.0–52.0)
Hemoglobin: 12 g/dL — ABNORMAL LOW (ref 13.0–17.0)
MCH: 29.5 pg (ref 26.0–34.0)
MCHC: 30.7 g/dL (ref 30.0–36.0)
MCV: 96.1 fL (ref 80.0–100.0)
Platelets: 183 10*3/uL (ref 150–400)
RBC: 4.07 MIL/uL — ABNORMAL LOW (ref 4.22–5.81)
RDW: 15.6 % — ABNORMAL HIGH (ref 11.5–15.5)
WBC: 9.7 10*3/uL (ref 4.0–10.5)
nRBC: 0 % (ref 0.0–0.2)

## 2022-12-12 LAB — URINALYSIS, ROUTINE W REFLEX MICROSCOPIC
Bilirubin Urine: NEGATIVE
Glucose, UA: NEGATIVE mg/dL
Hgb urine dipstick: NEGATIVE
Ketones, ur: NEGATIVE mg/dL
Leukocytes,Ua: NEGATIVE
Nitrite: NEGATIVE
Protein, ur: NEGATIVE mg/dL
Specific Gravity, Urine: 1.019 (ref 1.005–1.030)
pH: 5 (ref 5.0–8.0)

## 2022-12-12 LAB — BASIC METABOLIC PANEL
Anion gap: 8 (ref 5–15)
BUN: 27 mg/dL — ABNORMAL HIGH (ref 8–23)
CO2: 23 mmol/L (ref 22–32)
Calcium: 8.7 mg/dL — ABNORMAL LOW (ref 8.9–10.3)
Chloride: 103 mmol/L (ref 98–111)
Creatinine, Ser: 1.25 mg/dL — ABNORMAL HIGH (ref 0.61–1.24)
GFR, Estimated: 58 mL/min — ABNORMAL LOW (ref >60)
Glucose, Bld: 181 mg/dL — ABNORMAL HIGH (ref 70–99)
Potassium: 4.9 mmol/L (ref 3.5–5.1)
Sodium: 134 mmol/L — ABNORMAL LOW (ref 135–145)

## 2022-12-12 NOTE — Discharge Summary (Signed)
Physician Discharge Summary  Patient ID: Jesse Mann MRN: [id number] DOB/AGE: [birth year] 82 y.o.  Admit date: 12/11/2022 Discharge date: 12/12/2022  Admission Diagnoses:  Primary osteoarthritis of right knee  Discharge Diagnoses:  Principal Problem:   Primary osteoarthritis of right knee   Past Medical History:  Diagnosis Date   AICD (automatic cardioverter/defibrillator) present    Anemia    Arthritis    fingers- right (2-4 fingers with ROM issues).knee left   Bifascicular block    Cardiac arrest (HCC) [facility]   Cardiac arrhythmia    Dr. Revankar,cardiology [phone] [facility])   Cardiac murmur    Cardiomyopathy [facility])    Ischemic   CHF (congestive heart failure) (HCC)    Chronic kidney disease    CKD3   Coronary artery disease    Flail chest    GERD (gastroesophageal reflux disease)    Hemorrhoids    History of tracheostomy    History of ventricular fibrillation 07/22/2016   Hypercholesteremia    Hypertension    Left anterior fascicular block (LAFB)    Myocardial infarction (HCC)    7'14- [facility]- "feeling strange" eavaluated, heart cath done with stents x2 placed   Pneumonia    history of   Presence of permanent cardiac pacemaker    Prostate cancer (HCC)    PVC (premature ventricular contraction)    RBBB    Recurrent dry cough    intermittent daily -occ. production   Sleep apnea     Surgeries: Procedure(s): TOTAL KNEE ARTHROPLASTY on 12/11/2022   Consultants (if any):   Discharged Condition: Improved  Hospital Course: Jesse Mann is an 82 y.o. male who was admitted 12/11/2022 with a diagnosis of Primary osteoarthritis of right knee and went to the operating room on 12/11/2022 and underwent the above named procedures.    He was given perioperative antibiotics:  Anti-infectives (From admission, onward)    Start     Dose/Rate Route Frequency Ordered Stop    12/11/22 1530  ceFAZolin (ANCEF) IVPB 2g/100 mL premix        2 g 200 mL/hr over 30 Minutes Intravenous Every 6 hours 12/11/22 1316 12/11/22 2210   12/11/22 0645  ceFAZolin (ANCEF) IVPB 2g/100 mL premix        2 g 200 mL/hr over 30 Minutes Intravenous On call to O.R. 12/11/22 1478 12/11/22 2956     .  He was given sequential compression devices, early ambulation, and eliquis for DVT prophylaxis.  He benefited maximally from the hospital stay and there were no complications.    Recent vital signs:  Vitals:   12/12/22 0134 12/12/22 0532  BP: (!) 112/95 (!) 109/59  Pulse: (!) 54 (!) 52  Resp: 18 20  Temp: 97.7 F (36.5 C) (!) 97.4 F (36.3 C)  SpO2: 99% 100%    Recent laboratory studies:  Lab Results  Component Value Date   HGB 12.0 (L) 12/12/2022   HGB 13.8 12/03/2022   HGB 13.6 11/28/2022   Lab Results  Component Value Date   WBC 9.7 12/12/2022   PLT 183 12/12/2022   Lab Results  Component Value Date   INR 1.17 04/16/2017   Lab Results  Component Value Date   NA 134 (L) 12/12/2022   K 4.9 12/12/2022   CL 103 12/12/2022   CO2 23 12/12/2022   BUN 27 (H) 12/12/2022   CREATININE 1.25 (H) 12/12/2022   GLUCOSE 181 (H) 12/12/2022  Discharge Medications:   Allergies as of 12/12/2022   No Known Allergies      Medication List     STOP taking these medications    aspirin 81 MG tablet       TAKE these medications    acetaminophen 500 MG tablet Commonly known as: TYLENOL Take 2 tablets (1,000 mg total) by mouth every 8 (eight) hours as needed. What changed:  how much to take when to take this reasons to take this   amiodarone 200 MG tablet Commonly known as: PACERONE Take 200 mg by mouth daily.   apixaban 2.5 MG Tabs tablet Commonly known as: Eliquis Take 1 tablet (2.5 mg total) by mouth 2 (two) times daily.   carvedilol 12.5 MG tablet Commonly known as: COREG Take 6.25 mg by mouth 2 (two) times daily with a meal.   celecoxib 100 MG  capsule Commonly known as: CeleBREX Take 1 capsule (100 mg total) by mouth 2 (two) times daily for 14 days.   cyanocobalamin 1000 MCG tablet Commonly known as: VITAMIN B12 Take 1,000 mcg by mouth daily.   famotidine 40 MG tablet Commonly known as: PEPCID Take 40 mg at bedtime by mouth.   Farxiga 10 MG Tabs tablet Generic drug: dapagliflozin propanediol Take 10 mg by mouth daily.   magnesium oxide 400 (240 Mg) MG tablet Commonly known as: MAG-OX Take 400 mg by mouth daily.   methocarbamol 500 MG tablet Commonly known as: ROBAXIN Take 1 tablet (500 mg total) by mouth every 8 (eight) hours as needed for up to 10 days for muscle spasms.   nitroGLYCERIN 0.4 MG SL tablet Commonly known as: NITROSTAT Place 0.4 mg under the tongue every 5 (five) minutes as needed for chest pain.   ondansetron 4 MG tablet Commonly known as: Zofran Take 1 tablet (4 mg total) by mouth every 8 (eight) hours as needed for up to 14 days for nausea or vomiting.   oxyCODONE 5 MG immediate release tablet Commonly known as: Roxicodone Take 1 tablet (5 mg total) by mouth every 4 (four) hours as needed for up to 7 days for severe pain (pain score 7-10) or moderate pain (pain score 4-6).   rosuvastatin 10 MG tablet Commonly known as: CRESTOR Take 10 mg by mouth daily.        Diagnostic Studies: DG Knee Right Port  Result Date: 12/11/2022 CLINICAL DATA:  Postop. EXAM: PORTABLE RIGHT KNEE - 1-2 VIEW COMPARISON:  None Available. FINDINGS: Right hip arthroplasty in expected alignment. No periprosthetic lucency or fracture. Recent postsurgical change includes air and edema in the soft tissues. IMPRESSION: Right knee arthroplasty without immediate postoperative complication. Electronically Signed   By: Narda Rutherford M.D.   On: 12/11/2022 13:10    Disposition: Discharge disposition: 01-Home or Self Care       Discharge Instructions     Call MD / Call 911   Complete by: As directed    If you  experience chest pain or shortness of breath, CALL 911 and be transported to the hospital emergency room.  If you develope a fever above 101 F, pus (white drainage) or increased drainage or redness at the wound, or calf pain, call your surgeon's office.   Constipation Prevention   Complete by: As directed    Drink plenty of fluids.  Prune juice may be helpful.  You may use a stool softener, such as Colace (over the counter) 100 mg twice a day.  Use MiraLax (over the counter) for constipation  as needed.   Diet - low sodium heart healthy   Complete by: As directed    Do not put a pillow under the knee. Place it under the heel.   Complete by: As directed    Driving restrictions   Complete by: As directed    No driving for 4-6 weeks   Increase activity slowly as tolerated   Complete by: As directed    Post-operative opioid taper instructions:   Complete by: As directed    POST-OPERATIVE OPIOID TAPER INSTRUCTIONS: It is important to wean off of your opioid medication as soon as possible. If you do not need pain medication after your surgery it is ok to stop day one. Opioids include: Codeine, Hydrocodone(Norco, Vicodin), Oxycodone(Percocet, oxycontin) and hydromorphone amongst others.  Long term and even short term use of opiods can cause: Increased pain response Dependence Constipation Depression Respiratory depression And more.  Withdrawal symptoms can include Flu like symptoms Nausea, vomiting And more Techniques to manage these symptoms Hydrate well Eat regular healthy meals Stay active Use relaxation techniques(deep breathing, meditating, yoga) Do Not substitute Alcohol to help with tapering If you have been on opioids for less than two weeks and do not have pain than it is ok to stop all together.  Plan to wean off of opioids This plan should start within one week post op of your joint replacement. Maintain the same interval or time between taking each dose and first decrease  the dose.  Cut the total daily intake of opioids by one tablet each day Next start to increase the time between doses. The last dose that should be eliminated is the evening dose.      TED hose   Complete by: As directed    Use stockings (TED hose) for 2 weeks on both leg(s).  Then for 2 more weeks on the surgical leg.  You may remove them at night for sleeping.        Follow-up Information     Joen Laura, MD. Go on 12/26/2022.   Specialty: Orthopedic Surgery Why: Your appointment is scheduled for 2:15. Contact information: [address] [place] [address] [phone]         [facility]. Go on 12/16/2022.   Why: Your outpatient phyiscal therapy has been scheduled. they will call you with appointment time Contact information: [phone]                   Discharge Instructions      INSTRUCTIONS AFTER JOINT REPLACEMENT   Remove items at home which could result in a fall. This includes throw rugs or furniture in walking pathways ICE to the affected joint every three hours while awake for 30 minutes at a time, for at least the first 3-5 days, and then as needed for pain and swelling.  Continue to use ice for pain and swelling. You may notice swelling that will progress down to the foot and ankle.  This is normal after surgery.  Elevate your leg when you are not up walking on it.   Continue to use the breathing machine you got in the hospital (incentive spirometer) which will help keep your temperature down.  It is common for your temperature to cycle up and down following surgery, especially at night when you are not up moving around and exerting yourself.  The breathing machine keeps your lungs expanded and your temperature down.  DIET:  As you were doing prior to  hospitalization, we recommend a well-balanced diet.  DRESSING / WOUND CARE / SHOWERING:  Keep the surgical dressing until follow up.  The dressing is water proof, so  you can shower without any extra covering.  IF THE DRESSING FALLS OFF or the wound gets wet inside, change the dressing with sterile gauze.  Please use good hand washing techniques before changing the dressing.  Do not use any lotions or creams on the incision until instructed by your surgeon.    ACTIVITY  Increase activity slowly as tolerated, but follow the weight bearing instructions below.   No driving for 6 weeks or until further direction given by your physician.  You cannot drive while taking narcotics.  No lifting or carrying greater than 10 lbs. until further directed by your surgeon. Avoid periods of inactivity such as sitting longer than an hour when not asleep. This helps prevent blood clots.  You may return to work once you are authorized by your doctor.   WEIGHT BEARING: Weight bearing as tolerated with assist device (walker, cane, etc) as directed, use it as long as suggested by your surgeon or therapist, typically at least 4-6 weeks.  EXERCISES  Results after joint replacement surgery are often greatly improved when you follow the exercise, range of motion and muscle strengthening exercises prescribed by your doctor. Safety measures are also important to protect the joint from further injury. Any time any of these exercises cause you to have increased pain or swelling, decrease what you are doing until you are comfortable again and then slowly increase them. If you have problems or questions, call your caregiver or physical therapist for advice.   Rehabilitation is important following a joint replacement. After just a few days of immobilization, the muscles of the leg can become weakened and shrink (atrophy).  These exercises are designed to build up the tone and strength of the thigh and leg muscles and to improve motion. Often times heat used for twenty to thirty minutes before working out will loosen up your tissues and help with improving the range of motion but do not use heat  for the first two weeks following surgery (sometimes heat can increase post-operative swelling).   These exercises can be done on a training (exercise) mat, on the floor, on a table or on a bed. Use whatever works the best and is most comfortable for you.    Use music or television while you are exercising so that the exercises are a pleasant break in your day. This will make your life better with the exercises acting as a break in your routine that you can look forward to.   Perform all exercises about fifteen times, three times per day or as directed.  You should exercise both the operative leg and the other leg as well.  Exercises include:   Quad Sets - Tighten up the muscle on the front of the thigh (Quad) and hold for 5-10 seconds.   Straight Leg Raises - With your knee straight (if you were given a brace, keep it on), lift the leg to 60 degrees, hold for 3 seconds, and slowly lower the leg.  Perform this exercise against resistance later as your leg gets stronger.  Leg Slides: Lying on your back, slowly slide your foot toward your buttocks, bending your knee up off the floor (only go as far as is comfortable). Then slowly slide your foot back down until your leg is flat on the floor again.  Angel Wings: Lying on  your back spread your legs to the side as far apart as you can without causing discomfort.  Hamstring Strength:  Lying on your back, push your heel against the floor with your leg straight by tightening up the muscles of your buttocks.  Repeat, but this time bend your knee to a comfortable angle, and push your heel against the floor.  You may put a pillow under the heel to make it more comfortable if necessary.   A rehabilitation program following joint replacement surgery can speed recovery and prevent re-injury in the future due to weakened muscles. Contact your doctor or a physical therapist for more information on knee rehabilitation.   CONSTIPATION:  Constipation is defined medically  as fewer than three stools per week and severe constipation as less than one stool per week.  Even if you have a regular bowel pattern at home, your normal regimen is likely to be disrupted due to multiple reasons following surgery.  Combination of anesthesia, postoperative narcotics, change in appetite and fluid intake all can affect your bowels.   YOU MUST use at least one of the following options; they are listed in order of increasing strength to get the job done.  They are all available over the counter, and you may need to use some, POSSIBLY even all of these options:    Drink plenty of fluids (prune juice may be helpful) and high fiber foods Colace 100 mg by mouth twice a day  Senokot for constipation as directed and as needed Dulcolax (bisacodyl), take with full glass of water  Miralax (polyethylene glycol) once or twice a day as needed.  If you have tried all these things and are unable to have a bowel movement in the first 3-4 days after surgery call either your surgeon or your primary doctor.    If you experience loose stools or diarrhea, hold the medications until you stool forms back up.  If your symptoms do not get better within 1 week or if they get worse, check with your doctor.  If you experience "the worst abdominal pain ever" or develop nausea or vomiting, please contact the office immediately for further recommendations for treatment.  ITCHING:  If you experience itching with your medications, try taking only a single pain pill, or even half a pain pill at a time.  You can also use Benadryl over the counter for itching or also to help with sleep.   TED HOSE STOCKINGS:  Use stockings on both legs until for at least 2 weeks or as directed by physician office. They may be removed at night for sleeping.  MEDICATIONS:  See your medication summary on the "After Visit Summary" that nursing will review with you.  You may have some home medications which will be placed on hold until you  complete the course of blood thinner medication.  It is important for you to complete the blood thinner medication as prescribed.  Blood clot prevention (DVT Prophylaxis): After surgery you are at an increased risk for a blood clot. you were prescribed a blood thinner, Eliquis 2.5 mg, to be taken twice daily for a total of 4 weeks from surgery to help reduce your risk of getting a blood clot.  Signs of a pulmonary embolus (blood clot in the lungs) include sudden short of breath, feeling lightheaded or dizzy, chest pain with a deep breath, rapid pulse rapid breathing.  Signs of a blood clot in your arms or legs include new unexplained swelling and cramping, warm,  red or darkened skin around the painful area.  Please call the office or 911 right away if these signs or symptoms develop.  PRECAUTIONS:   If you experience chest pain or shortness of breath - call 911 immediately for transfer to the hospital emergency department.   If you develop a fever greater that 101 F, purulent drainage from wound, increased redness or drainage from wound, foul odor from the wound/dressing, or calf pain - CONTACT YOUR SURGEON.                                                   FOLLOW-UP APPOINTMENTS:  If you do not already have a post-op appointment, please call the office for an appointment to be seen by your surgeon.  Guidelines for how soon to be seen are listed in your "After Visit Summary", but are typically between 2-3 weeks after surgery.  If you have a specialized bandage, you may be told to follow up 1 week after surgery.  OTHER INSTRUCTIONS:  Knee Replacement:  Do not place pillow under knee, focus on keeping the knee straight while resting.  Place foam block, curve side up under heel at all times except when walking.  DO NOT modify, tear, cut, or change the foam block in any way.  POST-OPERATIVE OPIOID TAPER INSTRUCTIONS: It is important to wean off of your opioid medication as soon as possible. If you do not  need pain medication after your surgery it is ok to stop day one. Opioids include: Codeine, Hydrocodone(Norco, Vicodin), Oxycodone(Percocet, oxycontin) and hydromorphone amongst others.  Long term and even short term use of opiods can cause: Increased pain response Dependence Constipation Depression Respiratory depression And more.  Withdrawal symptoms can include Flu like symptoms Nausea, vomiting And more Techniques to manage these symptoms Hydrate well Eat regular healthy meals Stay active Use relaxation techniques(deep breathing, meditating, yoga) Do Not substitute Alcohol to help with tapering If you have been on opioids for less than two weeks and do not have pain than it is ok to stop all together.  Plan to wean off of opioids This plan should start within one week post op of your joint replacement. Maintain the same interval or time between taking each dose and first decrease the dose.  Cut the total daily intake of opioids by one tablet each day Next start to increase the time between doses. The last dose that should be eliminated is the evening dose.   MAKE SURE YOU:  Understand these instructions.  Get help right away if you are not doing well or get worse.    Thank you for letting us be a part of your medical care team.  It is a privilege we respect greatly.  We hope these instructions will help you stay on track for a fast and full recovery!   Information on my medicine - ELIQUIS (apixaban)  This medication education was reviewed with me or my healthcare representative as part of my discharge preparation.  The pharmacist that spoke with me during my hospital stay was:    Why was Eliquis prescribed for you? Eliquis was prescribed for you to reduce the risk of blood clots forming after orthopedic surgery.    What do You need to know about Eliquis? Take your Eliquis TWICE DAILY - one tablet in the morning and one tablet in  the evening with or without food.   It would be best to take the dose about the same time each day.  If you have difficulty swallowing the tablet whole please discuss with your pharmacist how to take the medication safely.  Take Eliquis exactly as prescribed by your doctor and DO NOT stop taking Eliquis without talking to the doctor who prescribed the medication.  Stopping without other medication to take the place of Eliquis may increase your risk of developing a clot.  After discharge, you should have regular check-up appointments with your healthcare provider that is prescribing your Eliquis.  What do you do if you miss a dose? If a dose of ELIQUIS is not taken at the scheduled time, take it as soon as possible on the same day and twice-daily administration should be resumed.  The dose should not be doubled to make up for a missed dose.  Do not take more than one tablet of ELIQUIS at the same time.  Important Safety Information A possible side effect of Eliquis is bleeding. You should call your healthcare provider right away if you experience any of the following: Bleeding from an injury or your nose that does not stop. Unusual colored urine (red or dark brown) or unusual colored stools (red or black). Unusual bruising for unknown reasons. A serious fall or if you hit your head (even if there is no bleeding).  Some medicines may interact with Eliquis and might increase your risk of bleeding or clotting while on Eliquis. To help avoid this, consult your healthcare provider or pharmacist prior to using any new prescription or non-prescription medications, including herbals, vitamins, non-steroidal anti-inflammatory drugs (NSAIDs) and supplements.  This website has more information on Eliquis (apixaban): http://www.eliquis.com/eliquis/home            Signed: Cason Luffman A Maryah Marinaro 12/12/2022, 7:49 AM

## 2022-12-12 NOTE — Plan of Care (Signed)

## 2022-12-12 NOTE — Progress Notes (Signed)
Physical Therapy Treatment Patient Details Name: Jesse Mann MRN: [id number] DOB: 20-Sep-1940 Today's Date: 12/12/2022   History of Present Illness 82 yo male presents to therapy s/p R TKA on 12/11/2022 due to failure of conservative measures. Pt PMH includes but is not limited to: defibrillator in situ, Mi, CHF, CAD, GERD, prostate ca s/p prostatectomy, HTN, HLD, OSA, R BBB, and back surgery.    PT Comments  POD # 1 am session Daughter present during session.  Pt OOB in recliner.  Assisted with amb.  General Gait Details: VC's on proper sequencing as well as proper walker to self distance.  B knees buckled x 1 during gait.  Educated to "trake smaller steps".  Tolerated a distance of 24 feet.  Daughter assisted by following with recliner. General transfer comment: VC's on proper hand placement esp with stand to sit. General stair comments: attempted stairs however unable to complete due to B knees buckle.  Pt has a RAMP to einter home however his bedroom is upstairs so he wanted to try.  Advised to "live downstairs for a while", pt/daughter agreed. Performed a few TE's following HEP handout. Will see pt again this afternoon for a second session to ensure gait safety.    If plan is discharge home, recommend the following: A little help with walking and/or transfers;A little help with bathing/dressing/bathroom;Assistance with cooking/housework;Assist for transportation;Help with stairs or ramp for entrance   Can travel by private vehicle        Equipment Recommendations  None recommended by PT    Recommendations for Other Services       Precautions / Restrictions Precautions Precautions: Knee;Fall Precaution Comments: "other" knee is bad Restrictions Weight Bearing Restrictions: No RLE Weight Bearing: Weight bearing as tolerated     Mobility  Bed Mobility   Pt OOB in recliner   Transfers Overall transfer level: Needs assistance   Transfers: Sit to/from Stand Sit to  Stand: Min assist           General transfer comment: VC's on proper hand placement esp with stand to sit.    Ambulation/Gait Ambulation/Gait assistance: Min assist, Contact guard assist Gait Distance (Feet): 24 Feet Assistive device: Rolling walker (2 wheels) Gait Pattern/deviations: Step-to pattern, Decreased stance time - right Gait velocity: decreased     General Gait Details: VC's on proper sequencing as well as proper walker to self distance.  B knees buckled x 1 during gait.  Educated to "trake smaller steps".  Tolerated a distance of 24 feet.  Daughter assisted by following with recliner.   Stairs Stairs: Yes Stairs assistance: Max assist Stair Management: One rail Right Number of Stairs: 1 General stair comments: attempted stairs however unable to complete due to B knees buckle.  Pt has a RAMP to einter home however his bedroom is upstairs so he wanted to try.  Advised to "live downstairs for a while", pt/daughter agreed.   Wheelchair Mobility     Tilt Bed    Modified Rankin (Stroke Patients Only)       Balance                                            Cognition Arousal: Alert Behavior During Therapy: WFL for tasks assessed/performed Overall Cognitive Status: Within Functional Limits for tasks assessed  General Comments: AxO x 3 pleasant/motivated        Exercises      General Comments        Pertinent Vitals/Pain Pain Assessment Pain Assessment: 0-10 Pain Score: 4  Pain Location: R knee Pain Intervention(s): Monitored during session, Premedicated before session, Repositioned, Ice applied    Home Living                          Prior Function            PT Goals (current goals can now be found in the care plan section) Progress towards PT goals: Progressing toward goals    Frequency    7X/week      PT Plan      Co-evaluation               AM-PAC PT "6 Clicks" Mobility   Outcome Measure  Help needed turning from your back to your side while in a flat bed without using bedrails?: A Little Help needed moving from lying on your back to sitting on the side of a flat bed without using bedrails?: A Little Help needed moving to and from a bed to a chair (including a wheelchair)?: A Little Help needed standing up from a chair using your arms (e.g., wheelchair or bedside chair)?: A Little Help needed to walk in hospital room?: A Little Help needed climbing 3-5 steps with a railing? : A Lot 6 Click Score: 17    End of Session Equipment Utilized During Treatment: Gait belt Activity Tolerance: Patient limited by fatigue Patient left: in chair;with call bell/phone within reach;with family/visitor present Nurse Communication: Mobility status PT Visit Diagnosis: Unsteadiness on feet (R26.81);Other abnormalities of gait and mobility (R26.89);Muscle weakness (generalized) (M62.81);History of falling (Z91.81);Difficulty in walking, not elsewhere classified (R26.2);Pain Pain - Right/Left: Right Pain - part of body: Knee;Leg     Time: 1002-1030 PT Time Calculation (min) (ACUTE ONLY): 28 min  Charges:    $Gait Training: 8-22 mins $Therapeutic Activity: 8-22 mins PT General Charges $$ ACUTE PT VISIT: 1 Visit                     {Jeani Fassnacht  PTA Acute  Rehabilitation Services Office M-F          [phone]

## 2022-12-12 NOTE — Progress Notes (Signed)
Provided discharge education/instructions, all questions and concerns addressed. Pt is not in any distress, discharged home with all of his belongings accompanied by daughter.

## 2022-12-12 NOTE — Progress Notes (Signed)
Physical Therapy Treatment Patient Details Name: Jesse Mann MRN: [id number] DOB: 07-12-1940 Today's Date: 12/12/2022   History of Present Illness 82 yo male presents to therapy s/p R TKA on 12/11/2022 due to failure of conservative measures. Pt PMH includes but is not limited to: defibrillator in situ, Mi, CHF, CAD, GERD, prostate ca s/p prostatectomy, HTN, HLD, OSA, R BBB, and back surgery.    PT Comments  POD # 1 pm session Assisted with amb in hallway an increased distance.  General Gait Details: VC's on proper sequencing as well as proper walker to self distance.  B knees buckled x 1 during gait.  Educated to "take smaller steps".  Tolerated a distance of 120 feet.  Upright posture. Then returned to room Instructed on  TE's following HEP handout.  Instructed on proper tech, freq as well as use of ICE.   Addressed all mobility questions, discussed appropriate activity, educated on use of ICE.  Pt ready for D/C to home with Daughter. Pt plans to "live downstairs" on first level.  Pt has met his mobility goals to D/C to home with Family support.     If plan is discharge home, recommend the following: A little help with walking and/or transfers;A little help with bathing/dressing/bathroom;Assistance with cooking/housework;Assist for transportation;Help with stairs or ramp for entrance   Can travel by private vehicle        Equipment Recommendations  None recommended by PT    Recommendations for Other Services       Precautions / Restrictions Precautions Precautions: Knee;Fall Precaution Comments: "other" knee is bad Restrictions Weight Bearing Restrictions: No RLE Weight Bearing: Weight bearing as tolerated     Mobility  Bed Mobility Overal bed mobility: Needs Assistance Bed Mobility: Supine to Sit     Supine to sit: Min assist, HOB elevated, Used rails     General bed mobility comments: OOB in recliner    Transfers Overall transfer level: Needs  assistance Equipment used: Rolling walker (2 wheels) Transfers: Sit to/from Stand Sit to Stand: Contact guard assist, Supervision Stand pivot transfers: Supervision, Contact guard assist         General transfer comment: VC's on proper hand placement esp with stand to sit.    Ambulation/Gait Ambulation/Gait assistance: Contact guard assist, Supervision Gait Distance (Feet): 120 Feet Assistive device: Rolling walker (2 wheels) Gait Pattern/deviations: Step-to pattern, Decreased stance time - right Gait velocity: decreased     General Gait Details: VC's on proper sequencing as well as proper walker to self distance.  B knees buckled x 1 during gait.  Educated to "take smaller steps".  Tolerated a distance of 120 feet.  Upright posture.   Stairs   Wheelchair Mobility     Tilt Bed    Modified Rankin (Stroke Patients Only)       Balance                                            Cognition Arousal: Alert Behavior During Therapy: WFL for tasks assessed/performed Overall Cognitive Status: Within Functional Limits for tasks assessed                                 General Comments: AxO x 3 pleasant/motivated slightly impulsive        Exercises  General Comments        Pertinent Vitals/Pain Pain Assessment Pain Assessment: 0-10 Pain Score: 3  Pain Location: R knee Pain Descriptors / Indicators: Aching Pain Intervention(s): Monitored during session, Premedicated before session, Repositioned, Ice applied    Home Living                          Prior Function            PT Goals (current goals can now be found in the care plan section) Progress towards PT goals: Progressing toward goals    Frequency    7X/week      PT Plan      Co-evaluation              AM-PAC PT "6 Clicks" Mobility   Outcome Measure  Help needed turning from your back to your side while in a flat bed without using  bedrails?: A Little Help needed moving from lying on your back to sitting on the side of a flat bed without using bedrails?: A Little Help needed moving to and from a bed to a chair (including a wheelchair)?: A Little Help needed standing up from a chair using your arms (e.g., wheelchair or bedside chair)?: A Little Help needed to walk in hospital room?: A Little Help needed climbing 3-5 steps with a railing? : A Little 6 Click Score: 18    End of Session Equipment Utilized During Treatment: Gait belt Activity Tolerance: Patient limited by fatigue Patient left: in chair;with call bell/phone within reach;with family/visitor present Nurse Communication: Mobility status PT Visit Diagnosis: Unsteadiness on feet (R26.81);Other abnormalities of gait and mobility (R26.89);Muscle weakness (generalized) (M62.81);History of falling (Z91.81);Difficulty in walking, not elsewhere classified (R26.2);Pain Pain - Right/Left: Right Pain - part of body: Knee;Leg     Time: 4540-9811 PT Time Calculation (min) (ACUTE ONLY): 16 min  Charges:    $Gait Training: 8-22 mins $Therapeutic Activity: 8-22 mins PT General Charges $$ ACUTE PT VISIT: 1 Visit                    Felecia Shelling  PTA Acute  Rehabilitation Services Office M-F          [phone]

## 2022-12-12 NOTE — Progress Notes (Signed)
     Subjective:  Patient reports pain as mild.  Difficulty yesterday mobilizing due to quad weakness, but feels like the leg is stronger this morning. Eager to work with PT to go home. No issues.  Objective:   VITALS:   Vitals:   12/11/22 1756 12/11/22 2159 12/12/22 0134 12/12/22 0532  BP: 139/71 111/67 (!) 112/95 (!) 109/59  Pulse: 62 60 (!) 54 (!) 52  Resp: 18 18 18 20   Temp: (!) 97.5 F (36.4 C) (!) 97.5 F (36.4 C) 97.7 F (36.5 C) (!) 97.4 F (36.3 C)  TempSrc:  Oral Oral   SpO2: 100% 95% 99% 100%  Weight:      Height:        Neurovascular intact Sensation intact distally Intact pulses distally Dorsiflexion/Plantar flexion intact Incision: dressing C/D/I Compartment soft    Lab Results  Component Value Date   WBC 9.7 12/12/2022   HGB 12.0 (L) 12/12/2022   HCT 39.1 12/12/2022   MCV 96.1 12/12/2022   PLT 183 12/12/2022   BMET    Component Value Date/Time   NA 134 (L) 12/12/2022 0329   NA 140 11/27/2016 1107   K 4.9 12/12/2022 0329   K 4.4 11/27/2016 1107   CL 103 12/12/2022 0329   CO2 23 12/12/2022 0329   CO2 27 11/27/2016 1107   GLUCOSE 181 (H) 12/12/2022 0329   GLUCOSE 87 11/27/2016 1107   BUN 27 (H) 12/12/2022 0329   BUN 18.9 11/27/2016 1107   CREATININE 1.25 (H) 12/12/2022 0329   CREATININE 1.30 (H) 09/07/2021 1453   CREATININE 1.2 11/27/2016 1107   CALCIUM 8.7 (L) 12/12/2022 0329   CALCIUM 9.0 11/27/2016 1107   EGFR >60 11/27/2016 1107   GFRNONAA 58 (L) 12/12/2022 0329   GFRNONAA 56 (L) 09/07/2021 1453      Xray: TKA components in good position no adverse features  Assessment/Plan: 1 Day Post-Op   Principal Problem:   Primary osteoarthritis of right knee  S/p R TKA 12/11/22  Post op recs: WB: WBAT Abx: ancef Imaging: PACU xrays DVT prophylaxis: Eliquis 2.5 mg twice daily x 4 weeks given cardiac history Follow up: 2 weeks after surgery for a wound check with Dr. Blanchie Dessert at [facility].  Address: [address], [place], [place] [address]  Office Phone: [phone]     Joen Laura 12/12/2022, 7:47 AM   Weber Cooks, MD  Contact information:   [phone] 7am-5pm epic message Dr. Blanchie Dessert, or call office for patient follow up: [phone] After hours and holidays please check [email or web address] for group call information for [facility]

## 2022-12-12 NOTE — TOC Transition Note (Signed)
Transition of Care [facility]) - CM/SW Discharge Note   Patient Details  Name: Jesse Mann MRN: [id number] Date of Birth: [birth year]  [facility] of [facility]) CM/SW Contact:  Amada Jupiter, LCSW Phone Number: 12/12/2022, 9:56 AM   Clinical Narrative:     Met with pt who confirms he has needed DME in the home.  OPPT already arranged with [facility].  No further TOC needs.  Final next level of care: OP Rehab Barriers to Discharge: No Barriers Identified   Patient Goals and CMS Choice      Discharge Placement                         Discharge Plan and Services Additional resources added to the After Visit Summary for                  DME Arranged: N/A DME Agency: NA                  Social Determinants of Health (SDOH) Interventions SDOH Screenings   Food Insecurity: No Food Insecurity (12/11/2022)  Housing: Low Risk  (12/11/2022)  Transportation Needs: No Transportation Needs (12/11/2022)  Utilities: Not At Risk (12/11/2022)  Tobacco Use: Medium Risk (12/11/2022)     Readmission Risk Interventions     No data to display

## 2022-12-16 DIAGNOSIS — M25561 Pain in right knee: Secondary | ICD-10-CM | POA: Diagnosis not present

## 2022-12-16 DIAGNOSIS — Z96651 Presence of right artificial knee joint: Secondary | ICD-10-CM | POA: Diagnosis not present

## 2022-12-16 DIAGNOSIS — R2689 Other abnormalities of gait and mobility: Secondary | ICD-10-CM | POA: Diagnosis not present

## 2022-12-16 DIAGNOSIS — M25661 Stiffness of right knee, not elsewhere classified: Secondary | ICD-10-CM | POA: Diagnosis not present

## 2022-12-19 DIAGNOSIS — M25561 Pain in right knee: Secondary | ICD-10-CM | POA: Diagnosis not present

## 2022-12-19 DIAGNOSIS — R2689 Other abnormalities of gait and mobility: Secondary | ICD-10-CM | POA: Diagnosis not present

## 2022-12-19 DIAGNOSIS — Z96651 Presence of right artificial knee joint: Secondary | ICD-10-CM | POA: Diagnosis not present

## 2022-12-19 DIAGNOSIS — M25661 Stiffness of right knee, not elsewhere classified: Secondary | ICD-10-CM | POA: Diagnosis not present

## 2022-12-23 DIAGNOSIS — R2689 Other abnormalities of gait and mobility: Secondary | ICD-10-CM | POA: Diagnosis not present

## 2022-12-23 DIAGNOSIS — M25661 Stiffness of right knee, not elsewhere classified: Secondary | ICD-10-CM | POA: Diagnosis not present

## 2022-12-23 DIAGNOSIS — M25561 Pain in right knee: Secondary | ICD-10-CM | POA: Diagnosis not present

## 2022-12-23 DIAGNOSIS — Z96651 Presence of right artificial knee joint: Secondary | ICD-10-CM | POA: Diagnosis not present

## 2022-12-26 DIAGNOSIS — M1711 Unilateral primary osteoarthritis, right knee: Secondary | ICD-10-CM | POA: Diagnosis not present

## 2022-12-31 DIAGNOSIS — Z96651 Presence of right artificial knee joint: Secondary | ICD-10-CM | POA: Diagnosis not present

## 2022-12-31 DIAGNOSIS — M25561 Pain in right knee: Secondary | ICD-10-CM | POA: Diagnosis not present

## 2022-12-31 DIAGNOSIS — R2689 Other abnormalities of gait and mobility: Secondary | ICD-10-CM | POA: Diagnosis not present

## 2022-12-31 DIAGNOSIS — M25661 Stiffness of right knee, not elsewhere classified: Secondary | ICD-10-CM | POA: Diagnosis not present

## 2023-01-10 DIAGNOSIS — M25661 Stiffness of right knee, not elsewhere classified: Secondary | ICD-10-CM | POA: Diagnosis not present

## 2023-01-10 DIAGNOSIS — R2689 Other abnormalities of gait and mobility: Secondary | ICD-10-CM | POA: Diagnosis not present

## 2023-01-10 DIAGNOSIS — Z96651 Presence of right artificial knee joint: Secondary | ICD-10-CM | POA: Diagnosis not present

## 2023-01-10 DIAGNOSIS — M25561 Pain in right knee: Secondary | ICD-10-CM | POA: Diagnosis not present

## 2023-01-14 DIAGNOSIS — Z96651 Presence of right artificial knee joint: Secondary | ICD-10-CM | POA: Diagnosis not present

## 2023-01-14 DIAGNOSIS — M25661 Stiffness of right knee, not elsewhere classified: Secondary | ICD-10-CM | POA: Diagnosis not present

## 2023-01-14 DIAGNOSIS — M25561 Pain in right knee: Secondary | ICD-10-CM | POA: Diagnosis not present

## 2023-01-14 DIAGNOSIS — R2689 Other abnormalities of gait and mobility: Secondary | ICD-10-CM | POA: Diagnosis not present

## 2023-01-15 DIAGNOSIS — Z95810 Presence of automatic (implantable) cardiac defibrillator: Secondary | ICD-10-CM | POA: Diagnosis not present

## 2023-01-15 DIAGNOSIS — Z4502 Encounter for adjustment and management of automatic implantable cardiac defibrillator: Secondary | ICD-10-CM | POA: Diagnosis not present

## 2023-01-15 DIAGNOSIS — I420 Dilated cardiomyopathy: Secondary | ICD-10-CM | POA: Diagnosis not present

## 2023-01-20 DIAGNOSIS — R2689 Other abnormalities of gait and mobility: Secondary | ICD-10-CM | POA: Diagnosis not present

## 2023-01-20 DIAGNOSIS — Z96651 Presence of right artificial knee joint: Secondary | ICD-10-CM | POA: Diagnosis not present

## 2023-01-20 DIAGNOSIS — M25661 Stiffness of right knee, not elsewhere classified: Secondary | ICD-10-CM | POA: Diagnosis not present

## 2023-01-20 DIAGNOSIS — M25561 Pain in right knee: Secondary | ICD-10-CM | POA: Diagnosis not present

## 2023-01-23 DIAGNOSIS — M1711 Unilateral primary osteoarthritis, right knee: Secondary | ICD-10-CM | POA: Diagnosis not present

## 2023-01-28 DIAGNOSIS — M25661 Stiffness of right knee, not elsewhere classified: Secondary | ICD-10-CM | POA: Diagnosis not present

## 2023-01-28 DIAGNOSIS — R2689 Other abnormalities of gait and mobility: Secondary | ICD-10-CM | POA: Diagnosis not present

## 2023-01-28 DIAGNOSIS — Z96651 Presence of right artificial knee joint: Secondary | ICD-10-CM | POA: Diagnosis not present

## 2023-01-28 DIAGNOSIS — M25561 Pain in right knee: Secondary | ICD-10-CM | POA: Diagnosis not present

## 2023-02-06 DIAGNOSIS — M25561 Pain in right knee: Secondary | ICD-10-CM | POA: Diagnosis not present

## 2023-02-06 DIAGNOSIS — R2689 Other abnormalities of gait and mobility: Secondary | ICD-10-CM | POA: Diagnosis not present

## 2023-02-06 DIAGNOSIS — Z96651 Presence of right artificial knee joint: Secondary | ICD-10-CM | POA: Diagnosis not present

## 2023-02-06 DIAGNOSIS — M25661 Stiffness of right knee, not elsewhere classified: Secondary | ICD-10-CM | POA: Diagnosis not present

## 2023-02-09 DIAGNOSIS — Z4502 Encounter for adjustment and management of automatic implantable cardiac defibrillator: Secondary | ICD-10-CM | POA: Diagnosis not present

## 2023-02-11 DIAGNOSIS — M1712 Unilateral primary osteoarthritis, left knee: Secondary | ICD-10-CM | POA: Diagnosis not present

## 2023-02-12 DIAGNOSIS — M25561 Pain in right knee: Secondary | ICD-10-CM | POA: Diagnosis not present

## 2023-02-12 DIAGNOSIS — M25661 Stiffness of right knee, not elsewhere classified: Secondary | ICD-10-CM | POA: Diagnosis not present

## 2023-02-12 DIAGNOSIS — Z96651 Presence of right artificial knee joint: Secondary | ICD-10-CM | POA: Diagnosis not present

## 2023-02-12 DIAGNOSIS — R2689 Other abnormalities of gait and mobility: Secondary | ICD-10-CM | POA: Diagnosis not present

## 2023-02-13 ENCOUNTER — Ambulatory Visit: Payer: Self-pay | Admitting: Emergency Medicine

## 2023-02-13 DIAGNOSIS — G8929 Other chronic pain: Secondary | ICD-10-CM

## 2023-02-13 NOTE — H&P (View-Only) (Signed)
 TOTAL KNEE ADMISSION H&P  Patient is being admitted for left total knee arthroplasty.  Subjective:  Chief Complaint:left knee pain.  HPI: Jesse Mann, 83 y.o. male, has a history of pain and functional disability in the left knee due to arthritis and has failed non-surgical conservative treatments for greater than 12 weeks to includeNSAID's and/or analgesics, corticosteriod injections, viscosupplementation injections, supervised PT with diminished ADL's post treatment, use of assistive devices, and activity modification.  Onset of symptoms was gradual, starting 10 years ago with gradually worsening course since that time. The patient noted no past surgery on the left knee(s).  Patient currently rates pain in the left knee(s) at 8 out of 10 with activity. Patient has night pain, worsening of pain with activity and weight bearing, pain that interferes with activities of daily living, and pain with passive range of motion.  Patient has evidence of periarticular osteophytes and joint space narrowing by imaging studies.  There is no active infection.  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 Revankar,cardiology [phone] [facility])   Cardiac murmur    Cardiomyopathy [facility])    Ischemic   CHF (congestive heart failure) (HCC)    Chronic kidney disease    CKD3   Coronary artery disease    Flail chest    GERD (gastroesophageal reflux disease)    Hemorrhoids    History of tracheostomy    History of ventricular fibrillation 07/22/2016   Hypercholesteremia    Hypertension    Left anterior fascicular block (LAFB)    Myocardial infarction (HCC)    [facility]'14[facility]- feeling strange eavaluated, heart cath done with stents x2 placed   Pneumonia    history of   Presence of permanent cardiac pacemaker    Prostate cancer (HCC)    PVC (premature ventricular contraction)    RBBB    Recurrent dry cough    intermittent daily -occ. production   Sleep apnea     Past Surgical History:  Procedure Laterality Date   BACK SURGERY     With Rods and screws   CARDIAC CATHETERIZATION     '14- stents placed x2 only.   CARDIAC CATHETERIZATION     COLONOSCOPY  2011   CORONARY ANGIOPLASTY     CYSTOSCOPY W/ URETERAL STENT PLACEMENT Bilateral 04/16/2017   Procedure: CYSTOSCOPY WITH CATHETER  REPLACEMENT FIREFLY;  Surgeon: Cam Morene ORN, MD;  Location: [facility];  Service: Urology;  Laterality: Bilateral;   EYE SURGERY Bilateral    cataract extraction   HAND SURGERY     HERNIA REPAIR  1997 and 1995   inguinal (x2 on one side)   ICD IMPLANT     INGUINAL HERNIA REPAIR  INSERT / REPLACE / REMOVE PACEMAKER     LYMPH NODE DISSECTION Bilateral 03/03/2015   Procedure: BILATERAL PELVIC LYMPH NODE DISSECTION;  Surgeon: Morene LELON Salines, MD;  Location: [facility];  Service: Urology;  Laterality: Bilateral;   PROSTATE BIOPSY     REPAIR EXTENSOR TENDON HAND Right    (3-4 fingers) '65- x3-4 timestrauma injury   ROBOT ASSISTED LAPAROSCOPIC RADICAL PROSTATECTOMY Bilateral 03/03/2015   Procedure: XI ROBOTIC ASSISTED LAPAROSCOPIC RADICAL PROSTATECTOMY;  Surgeon: Morene LELON Salines, MD;  Location:  [facility];  Service: Urology;  Laterality: Bilateral;   Surgery to correct flail chest after CPR     TONSILLECTOMY     TOTAL KNEE ARTHROPLASTY Right 12/11/2022   Procedure: TOTAL KNEE ARTHROPLASTY;  Surgeon: Edna Toribio LABOR, MD;  Location: [facility];  Service: Orthopedics;  Laterality: Right;   TRACHEOSTOMY      Current Outpatient Medications  Medication Sig Dispense Refill Last Dose/Taking   amiodarone  (PACERONE ) 200 MG tablet Take 200 mg by mouth daily.      apixaban  (ELIQUIS ) 2.5 MG TABS tablet Take 1 tablet (2.5 mg total) by mouth 2 (two) times daily. 60 tablet 0    carvedilol  (COREG ) 12.5 MG tablet Take 6.25 mg by mouth 2 (two) times daily with a meal.      cyanocobalamin  (VITAMIN B12) 1000 MCG tablet Take 1,000 mcg by mouth daily.      famotidine  (PEPCID ) 40 MG tablet Take 40 mg at bedtime by mouth.      FARXIGA  10 MG TABS tablet Take 10 mg by mouth daily.      magnesium  oxide (MAG-OX) 400 (240 Mg) MG tablet Take 400 mg by mouth daily.      nitroGLYCERIN  (NITROSTAT ) 0.4 MG SL tablet Place 0.4 mg under the tongue every 5 (five) minutes as needed for chest pain.       rosuvastatin  (CRESTOR ) 10 MG tablet Take 10 mg by mouth daily.      No current facility-administered medications for this visit.   No Known Allergies  Social History   Tobacco Use   Smoking status: Former    Current packs/day: 0.00    Average packs/day: 1 pack/day for 10.0 years (10.0 ttl pk-yrs)    Types: Cigarettes    Start date: 01/08/1956    Quit date: 01/07/1966    Years since quitting: 57.1   Smokeless tobacco: Never  Substance Use Topics   Alcohol  use: No    Alcohol /week: 0.0 standard drinks of alcohol     Family History  Problem Relation Age of Onset   Heart failure Mother    Cancer Father        colon   Cancer Paternal Grandmother        type unknown     Review of Systems  Musculoskeletal:  Positive for arthralgias.  All other systems reviewed and are negative.   Objective:  Physical  Exam Constitutional:      General: He is not in acute distress.    Appearance: Normal appearance. He is normal weight.  HENT:     Head: Normocephalic and atraumatic.  Eyes:     Extraocular Movements: Extraocular movements intact.     Conjunctiva/sclera: Conjunctivae normal.     Pupils: Pupils are equal, round, and reactive to light.  Cardiovascular:     Rate and Rhythm: Normal rate and regular rhythm.     Pulses: Normal pulses.     Heart sounds: Normal heart sounds.  Pulmonary:     Effort: Pulmonary effort is  normal. No respiratory distress.     Breath sounds: Normal breath sounds.  Abdominal:     General: Bowel sounds are normal. There is no distension.     Palpations: Abdomen is soft.     Tenderness: There is no abdominal tenderness.  Musculoskeletal:        General: Tenderness present.     Cervical back: Normal range of motion and neck supple.     Comments: TTP over medial and lateral joint line.  No calf tenderness, swelling, or erythema.  No overlying lesions of area of chief complaint.  Decreased strength and ROM due to elicited pain.  Pre-operative ROM 0-115.  Dorsiflexion and plantarflexion intact.  Stable to varus and valgus stress.  BLE appear grossly neurovascularly intact.  Gait mildly antalgic.   Lymphadenopathy:     Cervical: No cervical adenopathy.  Skin:    General: Skin is warm and dry.     Capillary Refill: Capillary refill takes less than 2 seconds.     Findings: No erythema or rash.  Neurological:     General: No focal deficit present.     Mental Status: He is alert and oriented to person, place, and time.  Psychiatric:        Mood and Affect: Mood normal.        Behavior: Behavior normal.     Vital signs in last 24 hours: @VSRANGES @  Labs:   Estimated body mass index is 25.62 kg/m as calculated from the following:   Height as of 12/11/22: 5' 6 (1.676 m).   Weight as of 12/11/22: 72 kg.   Imaging Review Plain radiographs demonstrate severe  degenerative joint disease of the left knee(s). The overall alignment issignificant varus. The bone quality appears to be fair for age and reported activity level.      Assessment/Plan:  End stage arthritis, left knee   The patient history, physical examination, clinical judgment of the provider and imaging studies are consistent with end stage degenerative joint disease of the left knee(s) and total knee arthroplasty is deemed medically necessary. The treatment options including medical management, injection therapy arthroscopy and arthroplasty were discussed at length. The risks and benefits of total knee arthroplasty were presented and reviewed. The risks due to aseptic loosening, infection, stiffness, patella tracking problems, thromboembolic complications and other imponderables were discussed. The patient acknowledged the explanation, agreed to proceed with the plan and consent was signed. Patient is being admitted for inpatient treatment for surgery, pain control, PT, OT, prophylactic antibiotics, VTE prophylaxis, progressive ambulation and ADL's and discharge planning. The patient is planning to be discharged home with outpatient PT.    Anticipated LOS equal to or greater than 2 midnights due to - Age 59 and older with one or more of the following:  - Obesity  - Expected need for hospital services (PT, OT, Nursing) required for safe  discharge  - Anticipated need for postoperative skilled nursing care or inpatient rehab  - Active co-morbidities: CAD, hx of MI, prostate cancer, HTN, HLD, CKD stage IIIa, GERD, OSA, hx of cardiac arrest with defibrillator placement, RBBB, anemia, ischemic cardiomyopathy, ureteral stents (BIL) OR   - Unanticipated findings during/Post Surgery: None  - Patient is a high risk of re-admission due to: None

## 2023-02-13 NOTE — H&P (Signed)
 TOTAL KNEE ADMISSION H&P  Patient is being admitted for left total knee arthroplasty.  Subjective:  Chief Complaint:left knee pain.  HPI: Jesse Mann, 83 y.o. male, has a history of pain and functional disability in the left knee due to arthritis and has failed non-surgical conservative treatments for greater than 12 weeks to includeNSAID's and/or analgesics, corticosteriod injections, viscosupplementation injections, supervised PT with diminished ADL's post treatment, use of assistive devices, and activity modification.  Onset of symptoms was gradual, starting 10 years ago with gradually worsening course since that time. The patient noted no past surgery on the left knee(s).  Patient currently rates pain in the left knee(s) at 8 out of 10 with activity. Patient has night pain, worsening of pain with activity and weight bearing, pain that interferes with activities of daily living, and pain with passive range of motion.  Patient has evidence of periarticular osteophytes and joint space narrowing by imaging studies.  There is no active infection.  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 Revankar,cardiology [phone]/[phone] [facility])   Cardiac murmur    Cardiomyopathy [facility])    Ischemic   CHF (congestive heart failure) (HCC)    Chronic kidney disease    CKD3   Coronary artery disease    Flail chest    GERD (gastroesophageal reflux disease)    Hemorrhoids    History of tracheostomy    History of ventricular fibrillation 07/22/2016   Hypercholesteremia    Hypertension    Left anterior fascicular block (LAFB)    Myocardial infarction (HCC)    [facility]'14[facility]- feeling strange eavaluated, heart cath done with stents x2 placed   Pneumonia    history of   Presence of permanent cardiac pacemaker    Prostate cancer (HCC)    PVC (premature ventricular contraction)    RBBB    Recurrent dry cough    intermittent daily -occ. production   Sleep apnea     Past Surgical History:  Procedure Laterality Date   BACK SURGERY     With Rods and screws   CARDIAC CATHETERIZATION     '14- stents placed x2 only.   CARDIAC CATHETERIZATION     COLONOSCOPY  2011   CORONARY ANGIOPLASTY     CYSTOSCOPY W/ URETERAL STENT PLACEMENT Bilateral 04/16/2017   Procedure: CYSTOSCOPY WITH CATHETER  REPLACEMENT FIREFLY;  Surgeon: Cam Morene ORN, MD;  Location: [facility];  Service: Urology;  Laterality: Bilateral;   EYE SURGERY Bilateral    cataract extraction   HAND SURGERY     HERNIA REPAIR  1997 and 1995   inguinal (x2 on one side)   ICD IMPLANT     INGUINAL HERNIA REPAIR  INSERT / REPLACE / REMOVE PACEMAKER     LYMPH NODE DISSECTION Bilateral 03/03/2015   Procedure: BILATERAL PELVIC LYMPH NODE DISSECTION;  Surgeon: Morene LELON Salines, MD;  Location: [facility];  Service: Urology;  Laterality: Bilateral;   PROSTATE BIOPSY     REPAIR EXTENSOR TENDON HAND Right    (3-4 fingers) '65- x3-4 timestrauma injury   ROBOT ASSISTED LAPAROSCOPIC RADICAL PROSTATECTOMY Bilateral 03/03/2015   Procedure: XI ROBOTIC ASSISTED LAPAROSCOPIC RADICAL PROSTATECTOMY;  Surgeon: Morene LELON Salines, MD;  Location:  [facility];  Service: Urology;  Laterality: Bilateral;   Surgery to correct flail chest after CPR     TONSILLECTOMY     TOTAL KNEE ARTHROPLASTY Right 12/11/2022   Procedure: TOTAL KNEE ARTHROPLASTY;  Surgeon: Edna Toribio LABOR, MD;  Location: [facility];  Service: Orthopedics;  Laterality: Right;   TRACHEOSTOMY      Current Outpatient Medications  Medication Sig Dispense Refill Last Dose/Taking   amiodarone  (PACERONE ) 200 MG tablet Take 200 mg by mouth daily.      apixaban  (ELIQUIS ) 2.5 MG TABS tablet Take 1 tablet (2.5 mg total) by mouth 2 (two) times daily. 60 tablet 0    carvedilol  (COREG ) 12.5 MG tablet Take 6.25 mg by mouth 2 (two) times daily with a meal.      cyanocobalamin  (VITAMIN B12) 1000 MCG tablet Take 1,000 mcg by mouth daily.      famotidine  (PEPCID ) 40 MG tablet Take 40 mg at bedtime by mouth.      FARXIGA  10 MG TABS tablet Take 10 mg by mouth daily.      magnesium  oxide (MAG-OX) 400 (240 Mg) MG tablet Take 400 mg by mouth daily.      nitroGLYCERIN  (NITROSTAT ) 0.4 MG SL tablet Place 0.4 mg under the tongue every 5 (five) minutes as needed for chest pain.       rosuvastatin  (CRESTOR ) 10 MG tablet Take 10 mg by mouth daily.      No current facility-administered medications for this visit.   No Known Allergies  Social History   Tobacco Use   Smoking status: Former    Current packs/day: 0.00    Average packs/day: 1 pack/day for 10.0 years (10.0 ttl pk-yrs)    Types: Cigarettes    Start date: 01/08/1956    Quit date: 01/07/1966    Years since quitting: 57.1   Smokeless tobacco: Never  Substance Use Topics   Alcohol  use: No    Alcohol /week: 0.0 standard drinks of alcohol     Family History  Problem Relation Age of Onset   Heart failure Mother    Cancer Father        colon   Cancer Paternal Grandmother        type unknown     Review of Systems  Musculoskeletal:  Positive for arthralgias.  All other systems reviewed and are negative.   Objective:  Physical  Exam Constitutional:      General: He is not in acute distress.    Appearance: Normal appearance. He is normal weight.  HENT:     Head: Normocephalic and atraumatic.  Eyes:     Extraocular Movements: Extraocular movements intact.     Conjunctiva/sclera: Conjunctivae normal.     Pupils: Pupils are equal, round, and reactive to light.  Cardiovascular:     Rate and Rhythm: Normal rate and regular rhythm.     Pulses: Normal pulses.     Heart sounds: Normal heart sounds.  Pulmonary:     Effort: Pulmonary effort is  normal. No respiratory distress.     Breath sounds: Normal breath sounds.  Abdominal:     General: Bowel sounds are normal. There is no distension.     Palpations: Abdomen is soft.     Tenderness: There is no abdominal tenderness.  Musculoskeletal:        General: Tenderness present.     Cervical back: Normal range of motion and neck supple.     Comments: TTP over medial and lateral joint line.  No calf tenderness, swelling, or erythema.  No overlying lesions of area of chief complaint.  Decreased strength and ROM due to elicited pain.  Pre-operative ROM 0-115.  Dorsiflexion and plantarflexion intact.  Stable to varus and valgus stress.  BLE appear grossly neurovascularly intact.  Gait mildly antalgic.   Lymphadenopathy:     Cervical: No cervical adenopathy.  Skin:    General: Skin is warm and dry.     Capillary Refill: Capillary refill takes less than 2 seconds.     Findings: No erythema or rash.  Neurological:     General: No focal deficit present.     Mental Status: He is alert and oriented to person, place, and time.  Psychiatric:        Mood and Affect: Mood normal.        Behavior: Behavior normal.     Vital signs in last 24 hours: @VSRANGES @  Labs:   Estimated body mass index is 25.62 kg/m as calculated from the following:   Height as of 12/11/22: 5' 6 (1.676 m).   Weight as of 12/11/22: 72 kg.   Imaging Review Plain radiographs demonstrate severe  degenerative joint disease of the left knee(s). The overall alignment issignificant varus. The bone quality appears to be fair for age and reported activity level.      Assessment/Plan:  End stage arthritis, left knee   The patient history, physical examination, clinical judgment of the provider and imaging studies are consistent with end stage degenerative joint disease of the left knee(s) and total knee arthroplasty is deemed medically necessary. The treatment options including medical management, injection therapy arthroscopy and arthroplasty were discussed at length. The risks and benefits of total knee arthroplasty were presented and reviewed. The risks due to aseptic loosening, infection, stiffness, patella tracking problems, thromboembolic complications and other imponderables were discussed. The patient acknowledged the explanation, agreed to proceed with the plan and consent was signed. Patient is being admitted for inpatient treatment for surgery, pain control, PT, OT, prophylactic antibiotics, VTE prophylaxis, progressive ambulation and ADL's and discharge planning. The patient is planning to be discharged home with outpatient PT.    Anticipated LOS equal to or greater than 2 midnights due to - Age 59 and older with one or more of the following:  - Obesity  - Expected need for hospital services (PT, OT, Nursing) required for safe  discharge  - Anticipated need for postoperative skilled nursing care or inpatient rehab  - Active co-morbidities: CAD, hx of MI, prostate cancer, HTN, HLD, CKD stage IIIa, GERD, OSA, hx of cardiac arrest with defibrillator placement, RBBB, anemia, ischemic cardiomyopathy, ureteral stents (BIL) OR   - Unanticipated findings during/Post Surgery: None  - Patient is a high risk of re-admission due to: None

## 2023-02-19 NOTE — Patient Instructions (Signed)
DUE TO COVID-19 ONLY TWO VISITORS  (aged 81 and older)  ARE ALLOWED TO COME WITH YOU AND STAY IN THE WAITING ROOM ONLY DURING PRE OP AND PROCEDURE.   **NO VISITORS ARE ALLOWED IN THE SHORT STAY AREA OR RECOVERY ROOM!!**  IF YOU WILL BE ADMITTED INTO THE HOSPITAL YOU ARE ALLOWED ONLY FOUR SUPPORT PEOPLE DURING VISITATION HOURS ONLY (7 AM -8PM)   The support person(s) must pass our screening, gel in and out, and wear a mask at all times, including in the patient's room. Patients must also wear a mask when staff or their support person are in the room. Visitors GUEST BADGE MUST BE WORN VISIBLY  One adult visitor may remain with you overnight and MUST be in the room by 8 P.M.     Your procedure is scheduled on: 03/05/23   Report to [facility] Main Entrance    Report to admitting at : 5:15 AM   Call this number if you have problems the morning of surgery [phone]   Do not eat food :After Midnight.   After Midnight you may have the following liquids until : 4:30 AM DAY OF SURGERY  Water Black Coffee (sugar ok, NO MILK/CREAM OR CREAMERS)  Tea (sugar ok, NO MILK/CREAM OR CREAMERS) regular and decaf                             Plain Jell-O (NO RED)                                           Fruit ices (not with fruit pulp, NO RED)                                     Popsicles (NO RED)                                                                  Juice: apple, WHITE grape, WHITE cranberry Sports drinks like Gatorade (NO RED)   The day of surgery:  Drink ONE (1) Pre-Surgery Clear Ensure at : 4:30 AM the morning of surgery. Drink in one sitting. Do not sip.  This drink was given to you during your hospital  pre-op appointment visit. Nothing else to drink after completing the  Pre-Surgery Clear Ensure or G2.          If you have questions, please contact your surgeon's office.  FOLLOW ANY ADDITIONAL PRE OP INSTRUCTIONS YOU RECEIVED FROM YOUR SURGEON'S OFFICE!!!   Oral  Hygiene is also important to reduce your risk of infection.                                    Remember - BRUSH YOUR TEETH THE MORNING OF SURGERY WITH YOUR REGULAR TOOTHPASTE  DENTURES WILL BE REMOVED PRIOR TO SURGERY PLEASE DO NOT APPLY "Poly grip" OR ADHESIVES!!!   Do NOT smoke after Midnight   Take these medicines the morning of surgery  with A SIP OF WATER: amiodarone,carvedilol.  HOLD vitamins,supplements and over the counter meds 7 days before surgery.  HOLD Farxiga after: 03/01/23                    You may not have any metal on your body including hair pins, jewelry, and body piercing             Do not wear lotions, powders, perfumes/cologne, or deodorant              Men may shave face and neck.   Do not bring valuables to the hospital. [facility] IS NOT             RESPONSIBLE   FOR VALUABLES.   Contacts, glasses, or bridgework may not be worn into surgery.   Bring small overnight bag day of surgery.   DO NOT BRING YOUR HOME MEDICATIONS TO THE HOSPITAL. PHARMACY WILL DISPENSE MEDICATIONS LISTED ON YOUR MEDICATION LIST TO YOU DURING YOUR ADMISSION IN THE HOSPITAL!    Patients discharged on the day of surgery will not be allowed to drive home.  Someone NEEDS to stay with you for the first 24 hours after anesthesia.   Special Instructions: Bring a copy of your healthcare power of attorney and living will documents         the day of surgery if you haven't scanned them before.              Please read over the following fact sheets you were given: IF YOU HAVE QUESTIONS ABOUT YOUR PRE-OP INSTRUCTIONS PLEASE CALL [phone]      Pre-operative 5 CHG Bath Instructions   You can play a key role in reducing the risk of infection after surgery. Your skin needs to be as free of germs as possible. You can reduce the number of germs on your skin by washing with CHG (chlorhexidine gluconate) soap before surgery. CHG is an antiseptic soap that kills germs and continues to kill  germs even after washing.   DO NOT use if you have an allergy to chlorhexidine/CHG or antibacterial soaps. If your skin becomes reddened or irritated, stop using the CHG and notify one of our RNs at : [phone].   Please shower with the CHG soap starting 4 days before surgery using the following schedule:     Please keep in mind the following:  DO NOT shave, including legs and underarms, starting the day of your first shower.   You may shave your face at any point before/day of surgery.  Place clean sheets on your bed the day you start using CHG soap. Use a clean washcloth (not used since being washed) for each shower. DO NOT sleep with pets once you start using the CHG.   CHG Shower Instructions:  If you choose to wash your hair and private area, wash first with your normal shampoo/soap.  After you use shampoo/soap, rinse your hair and body thoroughly to remove shampoo/soap residue.  Turn the water OFF and apply about 3 tablespoons (45 ml) of CHG soap to a CLEAN washcloth.  Apply CHG soap ONLY FROM YOUR NECK DOWN TO YOUR TOES (washing for 3-5 minutes)  DO NOT use CHG soap on face, private areas, open wounds, or sores.  Pay special attention to the area where your surgery is being performed.  If you are having back surgery, having someone wash your back for you may be helpful. Wait 2 minutes after CHG soap  is applied, then you may rinse off the CHG soap.  Pat dry with a clean towel  Put on clean clothes/pajamas   If you choose to wear lotion, please use ONLY the CHG-compatible lotions on the back of this paper.     Additional instructions for the day of surgery: DO NOT APPLY any lotions, deodorants, cologne, or perfumes.   Put on clean/comfortable clothes.  Brush your teeth.  Ask your nurse before applying any prescription medications to the skin.   CHG Compatible Lotions   Aveeno Moisturizing lotion  Cetaphil Moisturizing Cream  Cetaphil Moisturizing Lotion  Clairol Herbal  Essence Moisturizing Lotion, Dry Skin  Clairol Herbal Essence Moisturizing Lotion, Extra Dry Skin  Clairol Herbal Essence Moisturizing Lotion, Normal Skin  Curel Age Defying Therapeutic Moisturizing Lotion with Alpha Hydroxy  Curel Extreme Care Body Lotion  Curel Soothing Hands Moisturizing Hand Lotion  Curel Therapeutic Moisturizing Cream, Fragrance-Free  Curel Therapeutic Moisturizing Lotion, Fragrance-Free  Curel Therapeutic Moisturizing Lotion, Original Formula  Eucerin Daily Replenishing Lotion  Eucerin Dry Skin Therapy Plus Alpha Hydroxy Crme  Eucerin Dry Skin Therapy Plus Alpha Hydroxy Lotion  Eucerin Original Crme  Eucerin Original Lotion  Eucerin Plus Crme Eucerin Plus Lotion  Eucerin TriLipid Replenishing Lotion  Keri Anti-Bacterial Hand Lotion  Keri Deep Conditioning Original Lotion Dry Skin Formula Softly Scented  Keri Deep Conditioning Original Lotion, Fragrance Free Sensitive Skin Formula  Keri Lotion Fast Absorbing Fragrance Free Sensitive Skin Formula  Keri Lotion Fast Absorbing Softly Scented Dry Skin Formula  Keri Original Lotion  Keri Skin Renewal Lotion Keri Silky Smooth Lotion  Keri Silky Smooth Sensitive Skin Lotion  Nivea Body Creamy Conditioning Oil  Nivea Body Extra Enriched Lotion  Nivea Body Original Lotion  Nivea Body Sheer Moisturizing Lotion Nivea Crme  Nivea Skin Firming Lotion  NutraDerm 30 Skin Lotion  NutraDerm Skin Lotion  NutraDerm Therapeutic Skin Cream  NutraDerm Therapeutic Skin Lotion  ProShield Protective Hand Cream  Provon moisturizing lotion   Incentive Spirometer  An incentive spirometer is a tool that can help keep your lungs clear and active. This tool measures how well you are filling your lungs with each breath. Taking long deep breaths may help reverse or decrease the chance of developing breathing (pulmonary) problems (especially infection) following: A long period of time when you are unable to move or be active. BEFORE  THE PROCEDURE  If the spirometer includes an indicator to show your best effort, your nurse or respiratory therapist will set it to a desired goal. If possible, sit up straight or lean slightly forward. Try not to slouch. Hold the incentive spirometer in an upright position. INSTRUCTIONS FOR USE  Sit on the edge of your bed if possible, or sit up as far as you can in bed or on a chair. Hold the incentive spirometer in an upright position. Breathe out normally. Place the mouthpiece in your mouth and seal your lips tightly around it. Breathe in slowly and as deeply as possible, raising the piston or the ball toward the top of the column. Hold your breath for 3-5 seconds or for as long as possible. Allow the piston or ball to fall to the bottom of the column. Remove the mouthpiece from your mouth and breathe out normally. Rest for a few seconds and repeat Steps 1 through 7 at least 10 times every 1-2 hours when you are awake. Take your time and take a few normal breaths between deep breaths. The spirometer may include an indicator to  show your best effort. Use the indicator as a goal to work toward during each repetition. After each set of 10 deep breaths, practice coughing to be sure your lungs are clear. If you have an incision (the cut made at the time of surgery), support your incision when coughing by placing a pillow or rolled up towels firmly against it. Once you are able to get out of bed, walk around indoors and cough well. You may stop using the incentive spirometer when instructed by your caregiver.  RISKS AND COMPLICATIONS Take your time so you do not get dizzy or light-headed. If you are in pain, you may need to take or ask for pain medication before doing incentive spirometry. It is harder to take a deep breath if you are having pain. AFTER USE Rest and breathe slowly and easily. It can be helpful to keep track of a log of your progress. Your caregiver can provide you with a simple  table to help with this. If you are using the spirometer at home, follow these instructions: SEEK MEDICAL CARE IF:  You are having difficultly using the spirometer. You have trouble using the spirometer as often as instructed. Your pain medication is not giving enough relief while using the spirometer. You develop fever of 100.5 F (38.1 C) or higher. SEEK IMMEDIATE MEDICAL CARE IF:  You cough up bloody sputum that had not been present before. You develop fever of 102 F (38.9 C) or greater. You develop worsening pain at or near the incision site. MAKE SURE YOU:  Understand these instructions. Will watch your condition. Will get help right away if you are not doing well or get worse. Document Released: 05/06/2006 Document Revised: 03/18/2011 Document Reviewed: 07/07/2006 [facility] Patient Information 2014 [place], [place].   ________________________________________________________________________

## 2023-02-20 ENCOUNTER — Other Ambulatory Visit: Payer: Self-pay

## 2023-02-20 ENCOUNTER — Encounter (HOSPITAL_COMMUNITY): Payer: Self-pay

## 2023-02-20 ENCOUNTER — Encounter (HOSPITAL_COMMUNITY)
Admission: RE | Admit: 2023-02-20 | Discharge: 2023-02-20 | Disposition: A | Discharge status: Home / Self Care | Payer: Medicare HMO | Source: Ambulatory Visit | Attending: Orthopedic Surgery | Admitting: Orthopedic Surgery

## 2023-02-20 VITALS — BP 115/81 | HR 80 | Temp 98.4°F | Ht 66.0 in | Wt 151.0 lb

## 2023-02-20 DIAGNOSIS — Z01818 Encounter for other preprocedural examination: Secondary | ICD-10-CM | POA: Diagnosis not present

## 2023-02-20 DIAGNOSIS — M25562 Pain in left knee: Secondary | ICD-10-CM | POA: Diagnosis not present

## 2023-02-20 DIAGNOSIS — G8929 Other chronic pain: Secondary | ICD-10-CM | POA: Insufficient documentation

## 2023-02-20 LAB — COMPREHENSIVE METABOLIC PANEL
ALT: 10 U/L (ref 0–44)
AST: 18 U/L (ref 15–41)
Albumin: 4.1 g/dL (ref 3.5–5.0)
Alkaline Phosphatase: 54 U/L (ref 38–126)
Anion gap: 6 (ref 5–15)
BUN: 24 mg/dL — ABNORMAL HIGH (ref 8–23)
CO2: 24 mmol/L (ref 22–32)
Calcium: 8.9 mg/dL (ref 8.9–10.3)
Chloride: 109 mmol/L (ref 98–111)
Creatinine, Ser: 1.06 mg/dL (ref 0.61–1.24)
GFR, Estimated: >60 mL/min (ref >60)
Glucose, Bld: 97 mg/dL (ref 70–99)
Potassium: 4.7 mmol/L (ref 3.5–5.1)
Sodium: 139 mmol/L (ref 135–145)
Total Bilirubin: 0.7 mg/dL (ref 0.0–1.2)
Total Protein: 6.5 g/dL (ref 6.5–8.1)

## 2023-02-20 LAB — CBC WITH DIFFERENTIAL/PLATELET
Abs Immature Granulocytes: 0.01 10*3/uL (ref 0.00–0.07)
Basophils Absolute: 0.1 10*3/uL (ref 0.0–0.1)
Basophils Relative: 1 %
Eosinophils Absolute: 0.6 10*3/uL — ABNORMAL HIGH (ref 0.0–0.5)
Eosinophils Relative: 9 %
HCT: 46.8 % (ref 39.0–52.0)
Hemoglobin: 14.4 g/dL (ref 13.0–17.0)
Immature Granulocytes: 0 %
Lymphocytes Relative: 25 %
Lymphs Abs: 1.6 10*3/uL (ref 0.7–4.0)
MCH: 30.4 pg (ref 26.0–34.0)
MCHC: 30.8 g/dL (ref 30.0–36.0)
MCV: 98.9 fL (ref 80.0–100.0)
Monocytes Absolute: 0.7 10*3/uL (ref 0.1–1.0)
Monocytes Relative: 10 %
Neutro Abs: 3.5 10*3/uL (ref 1.7–7.7)
Neutrophils Relative %: 55 %
Platelets: 240 10*3/uL (ref 150–400)
RBC: 4.73 MIL/uL (ref 4.22–5.81)
RDW: 13.4 % (ref 11.5–15.5)
WBC: 6.4 10*3/uL (ref 4.0–10.5)
nRBC: 0 % (ref 0.0–0.2)

## 2023-02-20 LAB — SURGICAL PCR SCREEN
MRSA, PCR: NEGATIVE
Staphylococcus aureus: POSITIVE — AB

## 2023-02-20 NOTE — Progress Notes (Addendum)
For Anesthesia: PCP - Maryjean Ka, MD  Cardiologist - Maryln Gottron, MD :clearance: media tab.: 11/14/22  Bowel Prep reminder:  Chest x-ray - 03/06/22: [facility] EKG - 10/08/22: [facility] Stress Test -  ECHO - 04/10/22 Cardiac Cath - 03/07/22 Pacemaker/ICD device last checked: Pacemaker orders received: Requested. Device Rep notified:  Spinal Cord Stimulator:N/A  Sleep Study - Yes CPAP - NO  Fasting Blood Sugar - N/A Checks Blood Sugar _____ times a day Date and result of last Hgb A1c-  Last dose of GLP1 agonist-  GLP1 instructions:   Last dose of SGLT-2 inhibitors- Marcelline Deist will hold after:03/01/23 SGLT-2 instructions:   Blood Thinner Instructions: Aspirin Instructions: To hold 5 days before surgery. Last Dose:  Activity level: Can go up a flight of stairs and activities of daily living without stopping and without chest pain and/or shortness of breath   Able to exercise without chest pain and/or shortness of breath  Anesthesia review: Hx: HTN,OSA(NO CPAP),Ischemic cardiomyopathy,cardiac arrest,AICD,CKD III.  Patient denies shortness of breath, fever, cough and chest pain at PAT appointment   Patient verbalized understanding of instructions that were given to them at the PAT appointment. Patient was also instructed that they will need to review over the PAT instructions again at home before surgery.

## 2023-02-21 DIAGNOSIS — Z01818 Encounter for other preprocedural examination: Secondary | ICD-10-CM | POA: Diagnosis not present

## 2023-02-21 DIAGNOSIS — G8929 Other chronic pain: Secondary | ICD-10-CM | POA: Diagnosis not present

## 2023-02-21 DIAGNOSIS — M25562 Pain in left knee: Secondary | ICD-10-CM | POA: Diagnosis not present

## 2023-02-21 LAB — TYPE AND SCREEN
ABO/RH(D): O POS
Antibody Screen: NEGATIVE

## 2023-02-24 ENCOUNTER — Other Ambulatory Visit: Payer: Self-pay | Admitting: Oncology

## 2023-02-24 DIAGNOSIS — Z9079 Acquired absence of other genital organ(s): Secondary | ICD-10-CM

## 2023-02-24 DIAGNOSIS — D649 Anemia, unspecified: Secondary | ICD-10-CM

## 2023-02-24 NOTE — Progress Notes (Signed)
PCR: + STAPH °

## 2023-02-24 NOTE — Progress Notes (Unsigned)
 [facility]  [address] [place],  [place]  [address] [phone]  Clinic Day:  02/25/2023  Referring physician: Bobbye Morton, *   HISTORY OF PRESENT ILLNESS:  The patient is a 83 y.o. 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  A radical prostatectomy in February 2017 revealed Gleason grade prostate cancer that involved both seminal vesicles.  2 out of 8 pelvic lymph nodes removed with his prostate also contained cancer.  There was also evidence of positive margins seen with this surgery.  Due to biochemical relapse of his prostate cancer, the patient received Lupron injections from August 2017 to October 2019.  Over these 5+ years, his PSA was undetectable until a low-level biochemical relapse was seen again last year.  Nevertheless, he has elected to have his prostate cancer managed conservatively.  This gentleman also has a history of anemia, with past etiologies being iron deficiency, B12 deficiency and chronic renal insufficiency.  He last received IV iron in October 2024.  He comes in today for routine follow-up.  Since his last visit, the patient has been doing well.  He denies having increased fatigue or any overt forms of blood loss, as it pertains to his anemia.  With respect to his prostate cancer, he denies having urinary issues or bone pain which concerns him for possible disease progression.     PHYSICAL EXAM:  Blood pressure (!) 122/92, pulse 86, temperature 98 F (36.7 C), temperature source Oral, resp. rate 16, height 5\' 6"  (1.676 m), weight 153 lb 1.6 oz (69.4 kg), SpO2 98%. Wt Readings from Last 3 Encounters:  02/25/23 153 lb 1.6 oz (69.4 kg)  02/20/23 151 lb (68.5 kg)  12/11/22 158 lb 11.7 oz (72 kg)   Body mass index is 24.71 kg/m. Performance status (ECOG): 1 - Symptomatic but completely ambulatory Physical Exam Constitutional:      Appearance: Normal appearance. He is not ill-appearing.     Comments: He is  ambulating with a cane  HENT:     Mouth/Throat:     Mouth: Mucous membranes are moist.     Pharynx: Oropharynx is clear. No oropharyngeal exudate or posterior oropharyngeal erythema.  Cardiovascular:     Rate and Rhythm: Normal rate and regular rhythm.     Heart sounds: No murmur heard.    No friction rub. No gallop.  Pulmonary:     Effort: Pulmonary effort is normal. No respiratory distress.     Breath sounds: Normal breath sounds. No wheezing, rhonchi or rales.  Abdominal:     General: Bowel sounds are normal. There is no distension.     Palpations: Abdomen is soft. There is no mass.     Tenderness: There is no abdominal tenderness.  Musculoskeletal:        General: No swelling.     Right hand: Deformity (arthritic changes) present.     Left hand: Deformity (arthritic changes) present.     Right lower leg: No edema.     Left lower leg: No edema.  Lymphadenopathy:     Cervical: No cervical adenopathy.     Upper Body:     Right upper body: No supraclavicular or axillary adenopathy.     Left upper body: No supraclavicular or axillary adenopathy.     Lower Body: No right inguinal adenopathy. No left inguinal adenopathy.  Skin:    General: Skin is warm.     Coloration: Skin is not jaundiced.     Findings:  No lesion or rash.  Neurological:     General: No focal deficit present.     Mental Status: He is alert and oriented to person, place, and time. Mental status is at baseline.  Psychiatric:        Mood and Affect: Mood normal.        Behavior: Behavior normal.        Thought Content: Thought content normal.    LABS:      Latest Ref Rng & Units 02/25/2023    9:28 AM 02/20/2023    1:25 PM 12/12/2022    3:29 AM  CBC  WBC 4.0 - 10.5 K/uL 5.2  6.4  9.7   Hemoglobin 13.0 - 17.0 g/dL 16.1  09.6  04.5   Hematocrit 39.0 - 52.0 % 44.0  46.8  39.1   Platelets 150 - 400 K/uL 207  240  183       Latest Ref Rng & Units 02/25/2023    9:28 AM 02/20/2023    1:25 PM 12/12/2022    3:29  AM  CMP  Glucose 70 - 99 mg/dL 409  97  811   BUN 8 - 23 mg/dL 17  24  27    Creatinine 0.61 - 1.24 mg/dL 9.14  7.82  9.56   Sodium 135 - 145 mmol/L 143  139  134   Potassium 3.5 - 5.1 mmol/L 4.4  4.7  4.9   Chloride 98 - 111 mmol/L 108  109  103   CO2 22 - 32 mmol/L 25  24  23    Calcium 8.9 - 10.3 mg/dL 9.3  8.9  8.7   Total Protein 6.5 - 8.1 g/dL 6.4  6.5    Total Bilirubin 0.0 - 1.2 mg/dL 0.5  0.7    Alkaline Phos 38 - 126 U/L 70  54    AST 15 - 41 U/L 24  18    ALT 0 - 44 U/L 10  10      Latest Reference Range & Units 02/25/23 09:28  Iron 45 - 182 ug/dL 73  UIBC ug/dL 213  TIBC 086 - 578 ug/dL 469  Saturation Ratios 17.9 - 39.5 % 25  Ferritin 24 - 336 ng/mL 174  Folate >5.9 ng/mL 20.0  Vitamin B12 180 - 914 pg/mL 1,438 (H)  (H): Data is abnormally high  Latest Reference Range & Units 10/09/22 14:49 02/25/23 09:28  Prostatic Specific Antigen 0.00 - 4.00 ng/mL 0.15 0.14   ASSESSMENT & PLAN:  Assessment/Plan:  A 83 y.o. male with a history of multifactorial anemia and prostate cancer.  When evaluating his labs today, his hemoglobin is excellent, which reflects the benefits from his recently given IV iron.   I am also pleased as his iron, vitamin B12, and folate levels all remain normal.  He knows to continue taking his B12 to ensure adequate fortification of his cobalamin stores.  With respect to his prostate cancer, his PSA level, although minimally detectable, is lower than what it was at his last visit.  For now, his prostate cancer will continue to be followed conservatively.  Overall, this patient appears to be doing well.  I will see him back in 4 months to reassess both his prostate cancer and his anemia.  The patient understands all the plans discussed today and is in agreement with them.      Sehaj Mcenroe Kirby Funk, MD

## 2023-02-25 ENCOUNTER — Telehealth: Payer: Self-pay

## 2023-02-25 ENCOUNTER — Inpatient Hospital Stay: Payer: Medicare HMO

## 2023-02-25 ENCOUNTER — Inpatient Hospital Stay: Payer: Medicare HMO | Attending: Oncology | Admitting: Oncology

## 2023-02-25 ENCOUNTER — Other Ambulatory Visit: Payer: Self-pay | Admitting: Oncology

## 2023-02-25 ENCOUNTER — Telehealth: Payer: Self-pay | Admitting: Oncology

## 2023-02-25 VITALS — BP 122/92 | HR 86 | Temp 98.0°F | Resp 16 | Ht 66.0 in | Wt 153.1 lb

## 2023-02-25 DIAGNOSIS — D649 Anemia, unspecified: Secondary | ICD-10-CM

## 2023-02-25 DIAGNOSIS — D509 Iron deficiency anemia, unspecified: Secondary | ICD-10-CM | POA: Insufficient documentation

## 2023-02-25 DIAGNOSIS — C61 Malignant neoplasm of prostate: Secondary | ICD-10-CM | POA: Diagnosis not present

## 2023-02-25 DIAGNOSIS — E538 Deficiency of other specified B group vitamins: Secondary | ICD-10-CM | POA: Diagnosis not present

## 2023-02-25 DIAGNOSIS — Z9079 Acquired absence of other genital organ(s): Secondary | ICD-10-CM

## 2023-02-25 LAB — CBC WITH DIFFERENTIAL (CANCER CENTER ONLY)
Abs Immature Granulocytes: 0.01 10*3/uL (ref 0.00–0.07)
Basophils Absolute: 0 10*3/uL (ref 0.0–0.1)
Basophils Relative: 1 %
Eosinophils Absolute: 0.5 10*3/uL (ref 0.0–0.5)
Eosinophils Relative: 10 %
HCT: 44 % (ref 39.0–52.0)
Hemoglobin: 14.6 g/dL (ref 13.0–17.0)
Immature Granulocytes: 0 %
Lymphocytes Relative: 24 %
Lymphs Abs: 1.3 10*3/uL (ref 0.7–4.0)
MCH: 31.1 pg (ref 26.0–34.0)
MCHC: 33.2 g/dL (ref 30.0–36.0)
MCV: 93.8 fL (ref 80.0–100.0)
Monocytes Absolute: 0.6 10*3/uL (ref 0.1–1.0)
Monocytes Relative: 11 %
Neutro Abs: 2.8 10*3/uL (ref 1.7–7.7)
Neutrophils Relative %: 54 %
Platelet Count: 207 10*3/uL (ref 150–400)
RBC: 4.69 MIL/uL (ref 4.22–5.81)
RDW: 13.4 % (ref 11.5–15.5)
WBC Count: 5.2 10*3/uL (ref 4.0–10.5)
nRBC: 0 % (ref 0.0–0.2)
nRBC: 0 /100{WBCs}

## 2023-02-25 LAB — IRON AND TIBC
Iron: 73 ug/dL (ref 45–182)
Saturation Ratios: 25 % (ref 17.9–39.5)
TIBC: 287 ug/dL (ref 250–450)
UIBC: 214 ug/dL

## 2023-02-25 LAB — CMP (CANCER CENTER ONLY)
ALT: 10 U/L (ref 0–44)
AST: 24 U/L (ref 15–41)
Albumin: 4 g/dL (ref 3.5–5.0)
Alkaline Phosphatase: 70 U/L (ref 38–126)
Anion gap: 10 (ref 5–15)
BUN: 17 mg/dL (ref 8–23)
CO2: 25 mmol/L (ref 22–32)
Calcium: 9.3 mg/dL (ref 8.9–10.3)
Chloride: 108 mmol/L (ref 98–111)
Creatinine: 1.44 mg/dL — ABNORMAL HIGH (ref 0.61–1.24)
GFR, Estimated: 49 mL/min — ABNORMAL LOW (ref >60)
Glucose, Bld: 101 mg/dL — ABNORMAL HIGH (ref 70–99)
Potassium: 4.4 mmol/L (ref 3.5–5.1)
Sodium: 143 mmol/L (ref 135–145)
Total Bilirubin: 0.5 mg/dL (ref 0.0–1.2)
Total Protein: 6.4 g/dL — ABNORMAL LOW (ref 6.5–8.1)

## 2023-02-25 LAB — FOLATE: Folate: 20 ng/mL (ref >5.9)

## 2023-02-25 LAB — VITAMIN B12: Vitamin B-12: 1438 pg/mL — ABNORMAL HIGH (ref 180–914)

## 2023-02-25 LAB — FERRITIN: Ferritin: 174 ng/mL (ref 24–336)

## 2023-02-25 LAB — PSA: Prostatic Specific Antigen: 0.14 ng/mL (ref 0.00–4.00)

## 2023-02-25 NOTE — Telephone Encounter (Signed)
 Patient has been scheduled for follow-up visit per 02/25/23 LOS.  Pt given an appt calendar with date and time.

## 2023-02-25 NOTE — Telephone Encounter (Addendum)
 Latest Reference Range & Units 02/25/23 09:28  Prostatic Specific Antigen 0.00 - 4.00 ng/mL 0.14    Latest Reference Range & Units 02/25/23 09:28  Iron 45 - 182 ug/dL 73  UIBC ug/dL 161  TIBC 096 - 045 ug/dL 409  Saturation Ratios 17.9 - 39.5 % 25  Ferritin 24 - 336 ng/mL 174  Folate >5.9 ng/mL 20.0  Vitamin B12 180 - 914 pg/mL 1,438 (H)  (H): Data is abnormally high  @1433 - PSA results are still pending.

## 2023-02-25 NOTE — Care Plan (Signed)
 Ortho Bundle Case Management Note  Patient Details  Name: MAYSON MCNEISH MRN: [id number] Date of Birth: 1940-08-17   met with patient in the office for H&P .will discharge to home with family to assist. has DME. [facility]PP[facility] [place]. discharge instructions disussed questions answered. Patient and MD in agreement with plan. Choice offered.                DME Arranged:    DME Agency:     HH Arranged:    HH Agency:     Additional Comments: Please contact me with any questions of if this plan should need to change.  Shauna Hugh,  RN,BSN,MHA,CCM  [facility] Orthopaedic Specialist  [phone] 02/25/2023, 3:16 PM

## 2023-03-03 NOTE — Anesthesia Preprocedure Evaluation (Signed)
 Anesthesia Evaluation  Patient identified by MRN, date of birth, ID band Patient awake    Reviewed: Allergy & Precautions, NPO status , Patient's Chart, lab work & pertinent test results  Airway Mallampati: II  TM Distance: >3 FB Neck ROM: Full    Dental  (+) Edentulous Upper, Edentulous Lower, Dental Advisory Given   Pulmonary sleep apnea , pneumonia, former smoker   Pulmonary exam normal breath sounds clear to auscultation       Cardiovascular hypertension, Pt. on medications and Pt. on home beta blockers + CAD, + Past MI, + Cardiac Stents and +CHF  Normal cardiovascular exam+ dysrhythmias + pacemaker + Cardiac Defibrillator + Valvular Problems/Murmurs MR  Rhythm:Regular Rate:Normal  Echo 04/10/2022 SUMMARY  Left ventricular systolic function is moderately reduced.  There is moderate global hypokinesis of the left ventricle.  LV ejection fraction = 35-40%.  Device lead in the right ventricle  There is aortic valve sclerosis.  There is no aortic stenosis.  There is trace aortic regurgitation.  There is mild to moderate mitral regurgitation.  The aortic sinus is mildly dilated.  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 Dilated cardiomyopathy (CMD) -Some component of ischemic cardiomyopathy, he has a CTO of his RCA -Normal EDP last February on cath -Latest echo ejection  fraction is 40% -He is currently on Farxiga and carvedilol -Euvolemic on exam today. OptiVol is stable. -Stable     Neuro/Psych  Neuromuscular disease (s/p lumbar fusion)    GI/Hepatic Neg liver ROS,GERD  Medicated,,  Endo/Other  negative endocrine ROS    Renal/GU Renal disease     Musculoskeletal  (+) Arthritis ,    Abdominal   Peds  Hematology  (+) Blood dyscrasia, anemia   Anesthesia Other Findings   Reproductive/Obstetrics                              Lab Results  Component Value Date   WBC 5.2 02/25/2023   HGB 14.6 02/25/2023   HCT 44.0 02/25/2023   MCV 93.8 02/25/2023   PLT 207 02/25/2023   Lab Results  Component Value Date   NA 143 02/25/2023   CL 108 02/25/2023   K 4.4 02/25/2023   CO2 25 02/25/2023   BUN 17 02/25/2023   CREATININE 1.44 (H) 02/25/2023   GFRNONAA 49 (L) 02/25/2023   CALCIUM 9.3 02/25/2023   ALBUMIN 4.0 02/25/2023   GLUCOSE 101 (H) 02/25/2023    Anesthesia Physical Anesthesia Plan  ASA: 4  Anesthesia Plan: General   Post-op Pain Management: Tylenol PO (pre-op)* and Regional block*   Induction:   PONV Risk Score and Plan: 3 and Propofol infusion, Dexamethasone, Ondansetron, Treatment may vary due to age or medical condition and TIVA  Airway Management Planned: LMA  Additional Equipment:   Intra-op Plan:   Post-operative Plan: Extubation in OR  Informed Consent: I have reviewed the patients History and Physical, chart,  labs and discussed the procedure including the risks, benefits and alternatives for the proposed anesthesia with the patient or authorized representative who has indicated his/her understanding and acceptance.     Dental advisory given  Plan Discussed with: CRNA  Anesthesia Plan Comments: (See PAT note 03/03/23  Risks of anesthesia explained at length. This includes, but is not limited to, sore throat, damage to teeth, lips gums, tongue and vocal cords, nausea and vomiting,  reactions to medications, stroke, heart attack, and death. All patient questions were answered and the patient wishes to proceed. Risks of peripheral nerve block explained at length. This includes, but is not limited to, bleeding, infection, reactions to the medications, seizures, damage to surrounding structures, damage to nerves, permanent weakness, numbness, tingling and pain. All patient questions were answered and patient wishes to proceed with nerve block. )        Anesthesia Quick Evaluation

## 2023-03-03 NOTE — Progress Notes (Addendum)
 Anesthesia Chart Review   Case: [id number] Date/Time: 03/05/23 0715   Procedure: TOTAL KNEE ARTHROPLASTY (Left: Knee)   Anesthesia type: Spinal   Pre-op diagnosis: OA LEFT KNEE   Location: [facility] ROOM 10 / [facility]   Surgeons: Joen Laura, MD       DISCUSSION:83 y.o. former smoker with h/o sleep apnea, HTN, ischemic cardiomyopathy, cardiac arrest with V fib in 2018, s/p AICD (device orders on chart, under media tab, Physician Order), CKD Stage III, right knee OA scheduled for above procedure 12/11/2022 with Dr. Weber Cooks.    Pt last seen by cardiology 01/15/2023. Per OV note pt stable, echo 04/2022 with EF 40%, pt euvolemic, blood pressure stable.    Per cardio note 01/15/2023, "Low risk to hold aspirin for 7 days prior to procedure. Resume as soon as able. Recommend also holding SGLT2 inhibitor prior to surgery. "   Clearance received from PCP which states pt is optimized for planned procedure.   S/p right total knee 12/11/2022 with no anesthesia complications. ASA 4. Per notes, "Pt refuses SAB. Plan GA with LMA "  Echo 04/10/2022 SUMMARY  Left ventricular systolic function is moderately reduced.  There is moderate global hypokinesis of the left ventricle.  LV ejection fraction = 35-40%.  Device lead in the right ventricle  There is aortic valve sclerosis.  There is no aortic stenosis.  There is trace aortic regurgitation.  There is mild to moderate mitral regurgitation.  The aortic sinus is mildly dilated.  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  PROVIDERS: Street, Stephanie Coup, MD is PCP   Rohrbeck, Danie Chandler, MD is Cardiologist  LABS: Labs reviewed: Acceptable for  surgery. (all labs ordered are listed, but only abnormal results are displayed)  Labs Reviewed  TYPE AND SCREEN     IMAGES:   EKG:   CV:  Past Medical History:  Diagnosis Date   AICD (automatic cardioverter/defibrillator) present    Anemia    Arthritis    fingers- right (2-4 fingers with ROM issues).knee left   Bifascicular block    Cardiac arrest (HCC) [facility]/2016   [facility]   Cardiac arrhythmia    Dr. Revankar,cardiology [phone] [facility])   Cardiac murmur    Cardiomyopathy [facility])    Ischemic   CHF (congestive heart failure) (HCC)    Chronic kidney disease    CKD3   Coronary artery disease    Flail chest    GERD (gastroesophageal reflux disease)    Hemorrhoids    History of tracheostomy    History of ventricular fibrillation 07/22/2016   Hypercholesteremia    Hypertension    Left anterior fascicular block (LAFB)    Myocardial infarction (HCC)    7'14- [facility]- "feeling strange" eavaluated, heart cath done with stents x2 placed   Pneumonia    history of   Presence of permanent cardiac pacemaker    Prostate cancer (HCC)    PVC (premature ventricular contraction)    RBBB    Recurrent dry cough    intermittent daily -occ. production   Sleep apnea     Past Surgical History:  Procedure Laterality Date   BACK SURGERY  With Rods and screws   CARDIAC CATHETERIZATION     '14- stents placed x2 only.   CARDIAC CATHETERIZATION     COLONOSCOPY  2011   CORONARY ANGIOPLASTY     CYSTOSCOPY W/ URETERAL STENT PLACEMENT Bilateral 04/16/2017   Procedure: CYSTOSCOPY WITH CATHETER  REPLACEMENT FIREFLY;  Surgeon: Crist Fat, MD;  Location: [facility];  Service: Urology;  Laterality: Bilateral;   EYE SURGERY Bilateral    cataract extraction   HAND SURGERY     HERNIA REPAIR  1997 and 1995   inguinal (x2 on one side)   ICD IMPLANT     INGUINAL HERNIA REPAIR     INSERT / REPLACE /  REMOVE PACEMAKER     LYMPH NODE DISSECTION Bilateral 03/03/2015   Procedure: BILATERAL PELVIC LYMPH NODE DISSECTION;  Surgeon: Crist Fat, MD;  Location: [facility];  Service: Urology;  Laterality: Bilateral;   PROSTATE BIOPSY     REPAIR EXTENSOR TENDON HAND Right    (3-4 fingers) '65- x3-4 times"trauma injury   ROBOT ASSISTED LAPAROSCOPIC RADICAL PROSTATECTOMY Bilateral 03/03/2015   Procedure: XI ROBOTIC ASSISTED LAPAROSCOPIC RADICAL PROSTATECTOMY;  Surgeon: Crist Fat, MD;  Location: [facility];  Service: Urology;  Laterality: Bilateral;   Surgery to correct flail chest after CPR     TONSILLECTOMY     TOTAL KNEE ARTHROPLASTY Right 12/11/2022   Procedure: TOTAL KNEE ARTHROPLASTY;  Surgeon: Joen Laura, MD;  Location: [facility];  Service: Orthopedics;  Laterality: Right;   TRACHEOSTOMY      MEDICATIONS: No current facility-administered medications for this encounter.    amiodarone (PACERONE) 200 MG tablet   aspirin EC 81 MG tablet   carvedilol (COREG) 12.5 MG tablet   cyanocobalamin (VITAMIN B12) 1000 MCG tablet   famotidine (PEPCID) 40 MG tablet   FARXIGA 10 MG TABS tablet   magnesium oxide (MAG-OX) 400 (240 Mg) MG tablet   nitroGLYCERIN (NITROSTAT) 0.4 MG SL tablet   rosuvastatin (CRESTOR) 10 MG tablet      [facility], PA-C [facility] Pre-Surgical Testing [phone]

## 2023-03-05 ENCOUNTER — Other Ambulatory Visit: Payer: Medicare HMO

## 2023-03-05 ENCOUNTER — Encounter (HOSPITAL_COMMUNITY): Payer: Self-pay | Admitting: Orthopedic Surgery

## 2023-03-05 ENCOUNTER — Ambulatory Visit (HOSPITAL_COMMUNITY): Payer: Self-pay | Admitting: Physician Assistant

## 2023-03-05 ENCOUNTER — Other Ambulatory Visit: Payer: Self-pay

## 2023-03-05 ENCOUNTER — Observation Stay (HOSPITAL_COMMUNITY)
Admission: RE | Admit: 2023-03-05 | Discharge: 2023-03-06 | Disposition: A | Discharge status: Home / Self Care | Payer: Medicare HMO | Attending: Orthopedic Surgery | Admitting: Orthopedic Surgery

## 2023-03-05 ENCOUNTER — Ambulatory Visit: Payer: Medicare HMO | Admitting: Oncology

## 2023-03-05 ENCOUNTER — Observation Stay (HOSPITAL_COMMUNITY): Payer: Medicare HMO

## 2023-03-05 ENCOUNTER — Encounter (HOSPITAL_COMMUNITY)
Admission: RE | Disposition: A | Discharge status: Home / Self Care | Payer: Self-pay | Source: Home / Self Care | Attending: Orthopedic Surgery

## 2023-03-05 DIAGNOSIS — Z87891 Personal history of nicotine dependence: Secondary | ICD-10-CM | POA: Diagnosis not present

## 2023-03-05 DIAGNOSIS — Z01818 Encounter for other preprocedural examination: Principal | ICD-10-CM

## 2023-03-05 DIAGNOSIS — I130 Hypertensive heart and chronic kidney disease with heart failure and stage 1 through stage 4 chronic kidney disease, or unspecified chronic kidney disease: Secondary | ICD-10-CM | POA: Diagnosis not present

## 2023-03-05 DIAGNOSIS — G8918 Other acute postprocedural pain: Secondary | ICD-10-CM | POA: Diagnosis not present

## 2023-03-05 DIAGNOSIS — Z79899 Other long term (current) drug therapy: Secondary | ICD-10-CM | POA: Diagnosis not present

## 2023-03-05 DIAGNOSIS — M1712 Unilateral primary osteoarthritis, left knee: Principal | ICD-10-CM | POA: Diagnosis present

## 2023-03-05 DIAGNOSIS — Z7901 Long term (current) use of anticoagulants: Secondary | ICD-10-CM | POA: Insufficient documentation

## 2023-03-05 DIAGNOSIS — Z950 Presence of cardiac pacemaker: Secondary | ICD-10-CM | POA: Diagnosis not present

## 2023-03-05 DIAGNOSIS — I110 Hypertensive heart disease with heart failure: Secondary | ICD-10-CM

## 2023-03-05 DIAGNOSIS — N183 Chronic kidney disease, stage 3 unspecified: Secondary | ICD-10-CM | POA: Insufficient documentation

## 2023-03-05 DIAGNOSIS — Z8546 Personal history of malignant neoplasm of prostate: Secondary | ICD-10-CM | POA: Diagnosis not present

## 2023-03-05 DIAGNOSIS — Z471 Aftercare following joint replacement surgery: Secondary | ICD-10-CM | POA: Diagnosis not present

## 2023-03-05 DIAGNOSIS — Z96651 Presence of right artificial knee joint: Secondary | ICD-10-CM | POA: Diagnosis not present

## 2023-03-05 DIAGNOSIS — I2510 Atherosclerotic heart disease of native coronary artery without angina pectoris: Secondary | ICD-10-CM | POA: Diagnosis not present

## 2023-03-05 DIAGNOSIS — I509 Heart failure, unspecified: Secondary | ICD-10-CM | POA: Diagnosis not present

## 2023-03-05 DIAGNOSIS — Z96652 Presence of left artificial knee joint: Secondary | ICD-10-CM | POA: Diagnosis not present

## 2023-03-05 HISTORY — PX: TOTAL KNEE ARTHROPLASTY: SHX125

## 2023-03-05 SURGERY — ARTHROPLASTY, KNEE, TOTAL
Anesthesia: General | Site: Knee | Laterality: Left

## 2023-03-05 MED ORDER — SODIUM CHLORIDE (PF) 0.9 % IJ SOLN
INTRAMUSCULAR | Status: AC
Start: 2023-03-05 — End: ?
  Filled 2023-03-05: qty 50

## 2023-03-05 MED ORDER — SODIUM CHLORIDE 0.9% FLUSH
INTRAVENOUS | Status: DC | PRN
  Administered 2023-03-05: 30 mL

## 2023-03-05 MED ORDER — DIPHENHYDRAMINE HCL 12.5 MG/5ML PO ELIX: 12.5000 mg | ORAL_SOLUTION | ORAL | Status: DC | PRN

## 2023-03-05 MED ORDER — HYDROMORPHONE HCL 1 MG/ML IJ SOLN: 0.5000 mg | INTRAMUSCULAR | Status: DC | PRN

## 2023-03-05 MED ORDER — CHLORHEXIDINE GLUCONATE 0.12 % MT SOLN
15.0000 mL | Freq: Once | OROMUCOSAL | Status: AC
  Administered 2023-03-05: 15 mL via OROMUCOSAL

## 2023-03-05 MED ORDER — PROPOFOL 10 MG/ML IV BOLUS
INTRAVENOUS | Status: DC | PRN
  Administered 2023-03-05: 120 mg via INTRAVENOUS

## 2023-03-05 MED ORDER — POVIDONE-IODINE 10 % EX SWAB: 2.0000 | Freq: Once | CUTANEOUS | Status: DC

## 2023-03-05 MED ORDER — ACETAMINOPHEN 500 MG PO TABS: 1000.0000 mg | ORAL_TABLET | Freq: Three times a day (TID) | ORAL | Status: AC | PRN

## 2023-03-05 MED ORDER — MENTHOL 3 MG MT LOZG: 1.0000 | LOZENGE | OROMUCOSAL | Status: DC | PRN

## 2023-03-05 MED ORDER — EPHEDRINE 5 MG/ML INJ
INTRAVENOUS | Status: AC
  Filled 2023-03-05: qty 5

## 2023-03-05 MED ORDER — PHENOL 1.4 % MT LIQD: 1.0000 | OROMUCOSAL | Status: DC | PRN

## 2023-03-05 MED ORDER — CEFAZOLIN SODIUM-DEXTROSE 2-4 GM/100ML-% IV SOLN
2.0000 g | INTRAVENOUS | Status: AC
  Administered 2023-03-05: 2 g via INTRAVENOUS
  Filled 2023-03-05: qty 100

## 2023-03-05 MED ORDER — NITROGLYCERIN 0.4 MG SL SUBL: 0.4000 mg | SUBLINGUAL_TABLET | SUBLINGUAL | Status: DC | PRN

## 2023-03-05 MED ORDER — FENTANYL CITRATE (PF) 100 MCG/2ML IJ SOLN
INTRAMUSCULAR | Status: AC
  Filled 2023-03-05: qty 2

## 2023-03-05 MED ORDER — DOCUSATE SODIUM 100 MG PO CAPS
100.0000 mg | ORAL_CAPSULE | Freq: Two times a day (BID) | ORAL | Status: DC
  Administered 2023-03-05 – 2023-03-06 (×2): 100 mg via ORAL
  Filled 2023-03-05 (×2): qty 1

## 2023-03-05 MED ORDER — LACTATED RINGERS IV SOLN: INTRAVENOUS | Status: AC

## 2023-03-05 MED ORDER — SODIUM CHLORIDE (PF) 0.9 % IJ SOLN
INTRAMUSCULAR | Status: AC
  Filled 2023-03-05: qty 30

## 2023-03-05 MED ORDER — ONDANSETRON HCL 4 MG PO TABS: 4.0000 mg | ORAL_TABLET | Freq: Four times a day (QID) | ORAL | Status: DC | PRN

## 2023-03-05 MED ORDER — LACTATED RINGERS IV SOLN: INTRAVENOUS | Status: DC

## 2023-03-05 MED ORDER — ROSUVASTATIN CALCIUM 10 MG PO TABS
10.0000 mg | ORAL_TABLET | Freq: Every day | ORAL | Status: DC
  Administered 2023-03-06: 10 mg via ORAL
  Filled 2023-03-05: qty 1

## 2023-03-05 MED ORDER — LIDOCAINE HCL (CARDIAC) PF 100 MG/5ML IV SOSY
PREFILLED_SYRINGE | INTRAVENOUS | Status: DC | PRN
  Administered 2023-03-05: 70 mg via INTRATRACHEAL

## 2023-03-05 MED ORDER — POLYETHYLENE GLYCOL 3350 17 G PO PACK: 17.0000 g | PACK | Freq: Every day | ORAL | Status: DC | PRN

## 2023-03-05 MED ORDER — DEXAMETHASONE SODIUM PHOSPHATE 10 MG/ML IJ SOLN
INTRAMUSCULAR | Status: AC
  Filled 2023-03-05: qty 1

## 2023-03-05 MED ORDER — ONDANSETRON HCL 4 MG PO TABS: 4.0000 mg | ORAL_TABLET | Freq: Three times a day (TID) | ORAL | 0 refills | Status: AC | PRN

## 2023-03-05 MED ORDER — ONDANSETRON HCL 4 MG/2ML IJ SOLN: 4.0000 mg | Freq: Four times a day (QID) | INTRAMUSCULAR | Status: DC | PRN

## 2023-03-05 MED ORDER — APIXABAN 2.5 MG PO TABS
2.5000 mg | ORAL_TABLET | Freq: Two times a day (BID) | ORAL | Status: DC
  Administered 2023-03-06: 2.5 mg via ORAL
  Filled 2023-03-05: qty 1

## 2023-03-05 MED ORDER — CEFAZOLIN SODIUM-DEXTROSE 2-4 GM/100ML-% IV SOLN
2.0000 g | Freq: Four times a day (QID) | INTRAVENOUS | Status: AC
  Administered 2023-03-05 (×2): 2 g via INTRAVENOUS
  Filled 2023-03-05 (×2): qty 100

## 2023-03-05 MED ORDER — ROPIVACAINE HCL 5 MG/ML IJ SOLN
INTRAMUSCULAR | Status: DC | PRN
  Administered 2023-03-05: 30 mL via PERINEURAL

## 2023-03-05 MED ORDER — OMEPRAZOLE 40 MG PO CPDR: 40.0000 mg | DELAYED_RELEASE_CAPSULE | Freq: Every day | ORAL | 0 refills | Status: DC

## 2023-03-05 MED ORDER — BUPIVACAINE LIPOSOME 1.3 % IJ SUSP: 20.0000 mL | Freq: Once | INTRAMUSCULAR | Status: DC

## 2023-03-05 MED ORDER — AMIODARONE HCL 200 MG PO TABS
200.0000 mg | ORAL_TABLET | Freq: Every day | ORAL | Status: DC
  Administered 2023-03-06: 200 mg via ORAL
  Filled 2023-03-05: qty 1

## 2023-03-05 MED ORDER — BUPIVACAINE LIPOSOME 1.3 % IJ SUSP
INTRAMUSCULAR | Status: AC
  Filled 2023-03-05: qty 20

## 2023-03-05 MED ORDER — PHENYLEPHRINE HCL-NACL 20-0.9 MG/250ML-% IV SOLN
INTRAVENOUS | Status: DC | PRN
  Administered 2023-03-05: 50 ug/min via INTRAVENOUS

## 2023-03-05 MED ORDER — ONDANSETRON HCL 4 MG/2ML IJ SOLN
INTRAMUSCULAR | Status: AC
  Filled 2023-03-05: qty 2

## 2023-03-05 MED ORDER — 0.9 % SODIUM CHLORIDE (POUR BTL) OPTIME
TOPICAL | Status: DC | PRN
  Administered 2023-03-05: 1000 mL

## 2023-03-05 MED ORDER — SODIUM CHLORIDE 0.9 % IR SOLN
Status: DC | PRN
  Administered 2023-03-05: 2000 mL

## 2023-03-05 MED ORDER — PHENYLEPHRINE 80 MCG/ML (10ML) SYRINGE FOR IV PUSH (FOR BLOOD PRESSURE SUPPORT)
PREFILLED_SYRINGE | INTRAVENOUS | Status: AC
  Filled 2023-03-05: qty 10

## 2023-03-05 MED ORDER — ACETAMINOPHEN 500 MG PO TABS
1000.0000 mg | ORAL_TABLET | Freq: Four times a day (QID) | ORAL | Status: AC
  Administered 2023-03-05 – 2023-03-06 (×4): 1000 mg via ORAL
  Filled 2023-03-05 (×4): qty 2

## 2023-03-05 MED ORDER — APIXABAN 2.5 MG PO TABS: 2.5000 mg | ORAL_TABLET | Freq: Two times a day (BID) | ORAL | 0 refills | Status: DC

## 2023-03-05 MED ORDER — CELECOXIB 100 MG PO CAPS: 100.0000 mg | ORAL_CAPSULE | Freq: Two times a day (BID) | ORAL | 0 refills | Status: AC

## 2023-03-05 MED ORDER — OXYCODONE HCL 5 MG PO TABS: 2.5000 mg | ORAL_TABLET | Freq: Four times a day (QID) | ORAL | 0 refills | Status: AC | PRN

## 2023-03-05 MED ORDER — ISOPROPYL ALCOHOL 70 % SOLN
Status: AC
  Filled 2023-03-05: qty 480

## 2023-03-05 MED ORDER — ACETAMINOPHEN 500 MG PO TABS
1000.0000 mg | ORAL_TABLET | Freq: Once | ORAL | Status: AC
  Administered 2023-03-05: 1000 mg via ORAL
  Filled 2023-03-05: qty 2

## 2023-03-05 MED ORDER — BUPIVACAINE LIPOSOME 1.3 % IJ SUSP
INTRAMUSCULAR | Status: DC | PRN
  Administered 2023-03-05: 20 mL

## 2023-03-05 MED ORDER — LIDOCAINE HCL (PF) 2 % IJ SOLN
INTRAMUSCULAR | Status: AC
  Filled 2023-03-05: qty 5

## 2023-03-05 MED ORDER — HYDROMORPHONE HCL 1 MG/ML IJ SOLN
INTRAMUSCULAR | Status: AC
  Filled 2023-03-05: qty 1

## 2023-03-05 MED ORDER — PHENYLEPHRINE HCL (PRESSORS) 10 MG/ML IV SOLN
INTRAVENOUS | Status: DC | PRN
  Administered 2023-03-05 (×2): 80 ug via INTRAVENOUS

## 2023-03-05 MED ORDER — WATER FOR IRRIGATION, STERILE IR SOLN
Status: DC | PRN
  Administered 2023-03-05: 1000 mL

## 2023-03-05 MED ORDER — POLYETHYLENE GLYCOL 3350 17 G PO PACK: 17.0000 g | PACK | Freq: Every day | ORAL | 0 refills | Status: AC

## 2023-03-05 MED ORDER — ACETAMINOPHEN 325 MG PO TABS: 325.0000 mg | ORAL_TABLET | Freq: Four times a day (QID) | ORAL | Status: DC | PRN

## 2023-03-05 MED ORDER — DEXAMETHASONE SODIUM PHOSPHATE 10 MG/ML IJ SOLN
8.0000 mg | Freq: Once | INTRAMUSCULAR | Status: AC
Start: 2023-03-05 — End: 2023-03-05
  Administered 2023-03-05: 10 mg via INTRAVENOUS

## 2023-03-05 MED ORDER — MUPIROCIN 2 % EX OINT: 1.0000 | TOPICAL_OINTMENT | Freq: Two times a day (BID) | CUTANEOUS | 0 refills | Status: AC

## 2023-03-05 MED ORDER — KETOROLAC TROMETHAMINE 15 MG/ML IJ SOLN
7.5000 mg | Freq: Four times a day (QID) | INTRAMUSCULAR | Status: AC
  Administered 2023-03-05 – 2023-03-06 (×3): 7.5 mg via INTRAVENOUS
  Filled 2023-03-05 (×3): qty 1

## 2023-03-05 MED ORDER — FENTANYL CITRATE (PF) 100 MCG/2ML IJ SOLN
INTRAMUSCULAR | Status: DC | PRN
  Administered 2023-03-05: 50 ug via INTRAVENOUS

## 2023-03-05 MED ORDER — ISOPROPYL ALCOHOL 70 % SOLN
Status: DC | PRN
  Administered 2023-03-05: 1 via TOPICAL

## 2023-03-05 MED ORDER — CARVEDILOL 12.5 MG PO TABS
12.5000 mg | ORAL_TABLET | Freq: Two times a day (BID) | ORAL | Status: DC
  Administered 2023-03-05 – 2023-03-06 (×2): 12.5 mg via ORAL
  Filled 2023-03-05 (×2): qty 1

## 2023-03-05 MED ORDER — HYDROMORPHONE HCL 1 MG/ML IJ SOLN
0.2500 mg | INTRAMUSCULAR | Status: DC | PRN
  Administered 2023-03-05 (×3): 0.25 mg via INTRAVENOUS

## 2023-03-05 MED ORDER — SODIUM CHLORIDE 0.9 % IV SOLN: INTRAVENOUS | Status: DC

## 2023-03-05 MED ORDER — DROPERIDOL 2.5 MG/ML IJ SOLN: 0.6250 mg | Freq: Once | INTRAMUSCULAR | Status: DC | PRN

## 2023-03-05 MED ORDER — TRANEXAMIC ACID-NACL 1000-0.7 MG/100ML-% IV SOLN
1000.0000 mg | INTRAVENOUS | Status: AC
  Administered 2023-03-05: 1000 mg via INTRAVENOUS
  Filled 2023-03-05: qty 100

## 2023-03-05 MED ORDER — DAPAGLIFLOZIN PROPANEDIOL 10 MG PO TABS
10.0000 mg | ORAL_TABLET | Freq: Every day | ORAL | Status: DC
  Administered 2023-03-06: 10 mg via ORAL
  Filled 2023-03-05: qty 1

## 2023-03-05 MED ORDER — DEXAMETHASONE SODIUM PHOSPHATE 4 MG/ML IJ SOLN
INTRAMUSCULAR | Status: DC | PRN
  Administered 2023-03-05: 5 mg via PERINEURAL

## 2023-03-05 MED ORDER — ONDANSETRON HCL 4 MG/2ML IJ SOLN
INTRAMUSCULAR | Status: DC | PRN
  Administered 2023-03-05: 4 mg via INTRAVENOUS

## 2023-03-05 MED ORDER — PROPOFOL 10 MG/ML IV BOLUS
INTRAVENOUS | Status: AC
  Filled 2023-03-05: qty 20

## 2023-03-05 MED ORDER — PANTOPRAZOLE SODIUM 40 MG PO TBEC
40.0000 mg | DELAYED_RELEASE_TABLET | Freq: Every day | ORAL | Status: DC
  Administered 2023-03-05 – 2023-03-06 (×2): 40 mg via ORAL
  Filled 2023-03-05 (×2): qty 1

## 2023-03-05 MED ORDER — METHOCARBAMOL 500 MG PO TABS: 500.0000 mg | ORAL_TABLET | Freq: Four times a day (QID) | ORAL | Status: DC | PRN

## 2023-03-05 MED ORDER — ORAL CARE MOUTH RINSE: 15.0000 mL | Freq: Once | OROMUCOSAL | Status: AC

## 2023-03-05 MED ORDER — BUPIVACAINE-EPINEPHRINE (PF) 0.25% -1:200000 IJ SOLN
INTRAMUSCULAR | Status: AC
  Filled 2023-03-05: qty 30

## 2023-03-05 MED ORDER — CLONIDINE HCL (ANALGESIA) 100 MCG/ML EP SOLN
EPIDURAL | Status: DC | PRN
  Administered 2023-03-05: 80 ug

## 2023-03-05 MED ORDER — BUPIVACAINE-EPINEPHRINE 0.25% -1:200000 IJ SOLN
INTRAMUSCULAR | Status: DC | PRN
  Administered 2023-03-05: 30 mL

## 2023-03-05 MED ORDER — OXYCODONE HCL 5 MG PO TABS
5.0000 mg | ORAL_TABLET | ORAL | Status: DC | PRN
Start: 2023-03-05 — End: 2023-03-06
  Administered 2023-03-06: 5 mg via ORAL
  Filled 2023-03-05: qty 1

## 2023-03-05 MED ORDER — METHOCARBAMOL 500 MG PO TABS: 500.0000 mg | ORAL_TABLET | Freq: Three times a day (TID) | ORAL | 0 refills | Status: AC | PRN

## 2023-03-05 MED ORDER — CHLORHEXIDINE GLUCONATE 4 % EX SOLN: 1.0000 | CUTANEOUS | 1 refills | Status: AC

## 2023-03-05 MED ORDER — METHOCARBAMOL 1000 MG/10ML IJ SOLN: 500.0000 mg | Freq: Four times a day (QID) | INTRAMUSCULAR | Status: DC | PRN

## 2023-03-05 MED ORDER — DEXMEDETOMIDINE HCL IN NACL 80 MCG/20ML IV SOLN
INTRAVENOUS | Status: AC
  Filled 2023-03-05: qty 20

## 2023-03-05 SURGICAL SUPPLY — 54 items
BAG COUNTER SPONGE SURGICOUNT (BAG) IMPLANT
BLADE SAG 18X100X1.27 (BLADE) ×1 IMPLANT
BLADE SAW SAG 35X64 .89 (BLADE) ×1 IMPLANT
BLADE SAW SGTL 11.0X1.19X90.0M (BLADE) IMPLANT
BNDG COHESIVE 3X5 TAN ST LF (GAUZE/BANDAGES/DRESSINGS) ×1 IMPLANT
BNDG ELASTIC 6X10 VLCR STRL LF (GAUZE/BANDAGES/DRESSINGS) ×1 IMPLANT
BOWL SMART MIX CTS (DISPOSABLE) ×1 IMPLANT
CEMENT BONE R 1X40 (Cement) IMPLANT
CEMENT BONE REFOBACIN R1X40 US (Cement) IMPLANT
CHLORAPREP W/TINT 26 (MISCELLANEOUS) ×2 IMPLANT
COMP FEM CEMT PERSONA SZ9 LT (Knees) ×1 IMPLANT
COMPONENT FEM CEMT PRNSA SZ9LT (Knees) IMPLANT
COVER SURGICAL LIGHT HANDLE (MISCELLANEOUS) ×1 IMPLANT
CUFF TRNQT CYL 34X4.125X (TOURNIQUET CUFF) ×1 IMPLANT
DERMABOND ADVANCED .7 DNX12 (GAUZE/BANDAGES/DRESSINGS) ×1 IMPLANT
DRAPE INCISE IOBAN 85X60 (DRAPES) ×1 IMPLANT
DRAPE SHEET LG 3/4 BI-LAMINATE (DRAPES) ×1 IMPLANT
DRAPE U-SHAPE 47X51 STRL (DRAPES) ×1 IMPLANT
DRSG AQUACEL AG ADV 3.5X10 (GAUZE/BANDAGES/DRESSINGS) ×1 IMPLANT
ELECT REM PT RETURN 15FT ADLT (MISCELLANEOUS) ×1 IMPLANT
GAUZE SPONGE 4X4 12PLY STRL (GAUZE/BANDAGES/DRESSINGS) ×1 IMPLANT
GLOVE BIO SURGEON STRL SZ 6.5 (GLOVE) ×2 IMPLANT
GLOVE BIOGEL PI IND STRL 6.5 (GLOVE) ×1 IMPLANT
GLOVE BIOGEL PI IND STRL 8 (GLOVE) ×1 IMPLANT
GLOVE SURG ORTHO 8.0 STRL STRW (GLOVE) ×2 IMPLANT
GOWN STRL REUS W/ TWL XL LVL3 (GOWN DISPOSABLE) ×2 IMPLANT
HOLDER FOLEY CATH W/STRAP (MISCELLANEOUS) ×1 IMPLANT
HOOD PEEL AWAY T7 (MISCELLANEOUS) ×3 IMPLANT
INSERT COMP PS 8-11 GH LT (Insert) IMPLANT
KIT TURNOVER KIT A (KITS) IMPLANT
MANIFOLD NEPTUNE II (INSTRUMENTS) ×1 IMPLANT
MARKER SKIN DUAL TIP RULER LAB (MISCELLANEOUS) ×1 IMPLANT
NS IRRIG 1000ML POUR BTL (IV SOLUTION) ×1 IMPLANT
PACK TOTAL KNEE CUSTOM (KITS) ×1 IMPLANT
PIN DRILL HDLS TROCAR 75 4PK (PIN) IMPLANT
SCREW HEADED 33MM KNEE (MISCELLANEOUS) IMPLANT
SET HNDPC FAN SPRY TIP SCT (DISPOSABLE) ×1 IMPLANT
SOLUTION IRRIG SURGIPHOR (IV SOLUTION) IMPLANT
SPIKE FLUID TRANSFER (MISCELLANEOUS) ×1 IMPLANT
STEM POLY PAT PLY 38M KNEE (Knees) IMPLANT
STEM TIBIA 5 DEG SZ G L KNEE (Knees) IMPLANT
STRIP CLOSURE SKIN 1/2X4 (GAUZE/BANDAGES/DRESSINGS) ×1 IMPLANT
SUT MNCRL AB 3-0 PS2 18 (SUTURE) ×1 IMPLANT
SUT STRATAFIX 0 PDS 27 VIOLET (SUTURE) ×1 IMPLANT
SUT STRATAFIX 14 PDO 48 VLT (SUTURE) ×1 IMPLANT
SUT STRATAFIX PDO 1 14 VIOLET (SUTURE) ×1 IMPLANT
SUT VIC AB 2-0 CT2 27 (SUTURE) ×2 IMPLANT
SUTURE STRATFX 0 PDS 27 VIOLET (SUTURE) ×1 IMPLANT
SYR 50ML LL SCALE MARK (SYRINGE) ×1 IMPLANT
TIBIA STEM 5 DEG SZ G L KNEE (Knees) ×1 IMPLANT
TRAY FOLEY MTR SLVR 14FR STAT (SET/KITS/TRAYS/PACK) IMPLANT
TUBE SUCTION HIGH CAP CLEAR NV (SUCTIONS) ×1 IMPLANT
UNDERPAD 30X36 HEAVY ABSORB (UNDERPADS AND DIAPERS) ×1 IMPLANT
WRAP KNEE MAXI GEL POST OP (GAUZE/BANDAGES/DRESSINGS) IMPLANT

## 2023-03-05 NOTE — Plan of Care (Signed)
  Problem: Activity: Goal: Risk for activity intolerance will decrease Outcome: Progressing   Problem: Coping: Goal: Level of anxiety will decrease Outcome: Progressing   Problem: Pain Managment: Goal: General experience of comfort will improve and/or be controlled Outcome: Progressing   Problem: Education: Goal: Knowledge of the prescribed therapeutic regimen will improve Outcome: Progressing   Problem: Activity: Goal: Ability to avoid complications of mobility impairment will improve Outcome: Progressing   Problem: Pain Management: Goal: Pain level will decrease with appropriate interventions Outcome: Progressing

## 2023-03-05 NOTE — Progress Notes (Signed)
 Orthopedic Tech Progress Note Patient Details:  Jesse Mann 05-Mar-1940 [id number]  Ortho Devices Type of Ortho Device: Bone foam zero knee Ortho Device/Splint Location: LLE Ortho Device/Splint Interventions: Application   Post Interventions Patient Tolerated: Well  Jesse Mann 03/05/2023, 12:05 PM

## 2023-03-05 NOTE — Anesthesia Postprocedure Evaluation (Signed)
 Anesthesia Post Note  Patient: Jesse Mann  Procedure(s) Performed: TOTAL KNEE ARTHROPLASTY (Left: Knee)     Patient location during evaluation: PACU Anesthesia Type: General Level of consciousness: sedated and patient cooperative Pain management: pain level controlled Vital Signs Assessment: post-procedure vital signs reviewed and stable Respiratory status: spontaneous breathing Cardiovascular status: stable Anesthetic complications: no   No notable events documented.  Last Vitals:  Vitals:   03/05/23 1130 03/05/23 1150  BP: 120/71 126/76  Pulse: (!) 58 60  Resp: (!) 9 16  Temp:  36.4 C  SpO2: 100% 100%    Last Pain:  Vitals:   03/05/23 1203  TempSrc:   PainSc: 3                  Lewie Loron

## 2023-03-05 NOTE — Anesthesia Procedure Notes (Signed)
 Procedure Name: LMA Insertion Date/Time: 03/05/2023 7:32 AM  Performed by: Hulan Fess, CRNAPre-anesthesia Checklist: Patient identified, Emergency Drugs available, Suction available and Patient being monitored Patient Re-evaluated:Patient Re-evaluated prior to induction Oxygen Delivery Method: Circle System Utilized Preoxygenation: Pre-oxygenation with 100% oxygen Induction Type: IV induction Ventilation: Mask ventilation without difficulty LMA: LMA inserted LMA Size: 5.0 Number of attempts: 1 Airway Equipment and Method: Bite block Placement Confirmation: positive ETCO2 Tube secured with: Tape Dental Injury: Teeth and Oropharynx as per pre-operative assessment

## 2023-03-05 NOTE — Anesthesia Procedure Notes (Signed)
 Anesthesia Regional Block: Adductor canal block   Pre-Anesthetic Checklist: , timeout performed,  Correct Patient, Correct Site, Correct Laterality,  Correct Procedure, Correct Position, site marked,  Risks and benefits discussed,  Surgical consent,  Pre-op evaluation,  At surgeon's request and post-op pain management  Laterality: Lower and Left  Prep: chloraprep       Needles:  Injection technique: Single-shot  Needle Type: Stimiplex     Needle Length: 9cm  Needle Gauge: 21     Additional Needles:   Procedures:,,,, ultrasound used (permanent image in chart),,    Narrative:  Start time: 03/05/2023 6:49 AM End time: 03/05/2023 7:09 AM Injection made incrementally with aspirations every 5 mL.  Performed by: Personally  Anesthesiologist: Lewie Loron, MD  Additional Notes: BP cuff, EKG monitors applied. Sedation begun. Artery and nerve location verified with ultrasound. Anesthetic injected incrementally (5ml), slowly, and after negative aspirations under direct u/s guidance. Good fascial/perineural spread. Tolerated well.

## 2023-03-05 NOTE — Interval H&P Note (Signed)

## 2023-03-05 NOTE — Op Note (Signed)
 DATE OF SURGERY:  03/05/2023 TIME: 9:03 AM  PATIENT NAME:  Jesse Mann   AGE: 83 y.o.    PRE-OPERATIVE DIAGNOSIS: End-stage left knee osteoarthritis  POST-OPERATIVE DIAGNOSIS:  Same  PROCEDURE: Cemented left total Knee Arthroplasty  SURGEON:  Doreene Forrey A Breelyn Icard, MD   ASSISTANT: Kathie Dike, PA-C, present and scrubbed throughout the case, critical for assistance with exposure, retraction, instrumentation, and closure.   OPERATIVE IMPLANTS:  Cemented size 9 left standard CR femur, left G tibial baseplate, 11 mm MC polyethylene insert, 38 mm cemented all poly patella Implant Name Type Inv. Item Serial No. Manufacturer Lot No. LRB No. Used Action  CEMENT BONE R 1X40 - [id number] Cement CEMENT BONE R 1X40  [employer](ORTH,TRAU,BIO,SG) [id number] Left 2 Implanted  STEM POLY PAT PLY 10M KNEE - [id number] Knees STEM POLY PAT PLY 10M KNEE  [employer](ORTH,TRAU,BIO,SG) [id number] Left 1 Implanted  TIBIA STEM 5 DEG SZ G L KNEE - [id number] Knees TIBIA STEM 5 DEG SZ G L KNEE  [employer]ON(ORTH,TRAU,BIO,SG) [id number] Left 1 Implanted  COMP FEM CEMT PERSONA SZ9 LT - [id number] Knees COMP FEM CEMT PERSONA SZ9 LT  [employer](ORTH,TRAU,BIO,SG) [id number] Left 1 Implanted  INSERT COMP PS 8-11 GH LT - [id number] Insert INSERT COMP PS 8-11 GH LT  [employer]ON(ORTH,TRAU,BIO,SG) [id number] Left 1 Implanted      PREOPERATIVE INDICATIONS:  Jesse Mann is a 83 y.o. year old male with end stage bone on bone degenerative arthritis of the knee who failed conservative treatment, including injections, antiinflammatories, activity modification, and assistive devices, and had significant impairment of their activities of daily living, and elected for Total Knee Arthroplasty.   The risks, benefits, and alternatives were discussed at length including but not limited to the risks of infection, bleeding, nerve injury, stiffness, blood clots, the need for revision surgery, cardiopulmonary complications,  among others, and they were willing to proceed.  ESTIMATED BLOOD LOSS: 50cc  OPERATIVE DESCRIPTION:   Once adequate anesthesia was induced, preoperative antibiotics, 2 gm of ancef,1 gm of Tranexamic Acid, and 8 mg of Decadron administered, the patient was positioned supine with a left thigh tourniquet placed.  The left lower extremity was prepped and draped in sterile fashion.  A time-  out was performed identifying the patient, planned procedure, and the appropriate extremity.     The leg was  exsanguinated, tourniquet elevated to 250 mmHg.  A midline incision was  made followed by median parapatellar arthrotomy. Anterior horn of the medial meniscus was released and resected. A medial release was performed, the infrapatellar fat pad was resected with care taken to protect the patellar tendon. The suprapatellar fat was removed to exposed the distal anterior femur. The anterior horn of the lateral meniscus and ACL were released.    Following initial  exposure, I first started with the femur  The femoral  canal was opened with a drill, canal was suctioned to try to prevent fat emboli.  An  intramedullary rod was passed set at 6 degrees valgus, 10 mm. The distal femur was resected.  Following this resection, the tibia was  subluxated anteriorly.  Using the extramedullary guide, 2mm of bone was resected off   the medial defect.  We confirmed the gap would be  stable medially and laterally with a size 10mm spacer block as well as confirmed that the tibial cut was perpendicular in the coronal plane, checking with an alignment rod.    Once this was done, the posterior femoral referencing femoral sizer was placed  under to the posterior condyles with 3 degrees of external rotational which was parallel to the transepicondylar axis and perpendicular to [employer]. The femur was sized to be a size 9 in the anterior-  posterior dimension. The  anterior, posterior, and  chamfer cuts were made without difficulty  nor   notching making certain that I was along the anterior cortex to help  with flexion gap stability. Next a laminar spreader was placed with the knee in flexion and the medial lateral menisci were resected.  5 cc of the Exparel mixture was injected in the medial side of the back of the knee and 3 cc in the lateral side.  1/2 inch curved osteotome was used to resect posterior osteophyte that was then removed with a pituitary rongeur.       At this point, the tibia was sized to be a size G.  The size G tray was  then pinned in position. Trial reduction was now carried with a 9 femur, G tibia, a 10 mm MC insert. Slight lateral laxity upsized to the size 11mm insert. The knee had full extension and was stable to varus valgus stress in extension.  The knee was slightly tight in flexion and the PCL was partially released.   Attention was next directed to the patella.  Precut  measurement was noted to be 25mm.  I resected down to 15mm and used a  38mm patellar button to restore patellar height as well as cover the cut surface.     The patella lug holes were drilled and a 38mm patella poly trial was placed.    The knee was brought to full extension with good flexion stability with the patella tracking through the trochlea without application of pressure.     Next the femoral component was again assessed and determined to be seated and appropriately lateralized.  The femoral lug holes were drilled.  The femoral component was then removed. Tibial component was again assessed and felt to be seated and appropriately rotated with the medial third of the tubercle. The tibia was then drilled, and keel punched.     Final components were  opened and cement was mixed.      Final implants were then  cemented onto cleaned and dried cut surfaces of bone with the knee brought to extension with a 11mm MC poly.  The knee was irrigated with sterile Betadine diluted in saline as well as pulse lavage normal saline. The  synovial lining was  then injected a dilute Exparel with 30cc of 0.25% marcaine with epinephrine.         Once the cement had fully cured, excess cement was removed throughout the knee.  I confirmed that I was satisfied with the range of motion and stability, and the final 11mm MC poly insert was chosen.  It was placed into the knee.         The tourniquet had been let down at 63 minutes.  No significant hemostasis was required.  The medial parapatellar arthrotomy was then reapproximated using  #1 Stratafix sutures with the knee  in flexion.  The remaining wound was closed with 0 stratafix, 2-0 Vicryl, and running 3-0 Monocryl. The knee was cleaned, dried, dressed sterilely using Dermabond and   Aquacel dressing.  The patient was then brought to recovery room in stable condition, tolerating the procedure  well. There were no complications.   Post op recs: WB: WBAT Abx: ancef Imaging: PACU xrays DVT prophylaxis: Aspirin 81mg   BID x4 weeks Follow up: 2 weeks after surgery for a wound check with Dr. Blanchie Dessert at [facility].  Address: [address], [place], [place] [address]  Office Phone: [phone]  Weber Cooks, MD Orthopaedic Surgery

## 2023-03-05 NOTE — Evaluation (Signed)
 Physical Therapy Evaluation Patient Details Name: Jesse Mann MRN: [id number] DOB: 07/10/1940 Today's Date: 03/05/2023  History of Present Illness  83 yo male presents to therapy s/p L TKA on 03/05/2023 due to failure of conservative measures. Pt PMH includes but is not limited to: defibrillator in situ, MI, CHF, CAD, GERD, prostate ca s/p prostatectomy, HTN, HLD, OSA, R BBB, back surgery and R TKA on 12/11/2022.  Clinical Impression  JAIVION KINGSLEY is a 83 y.o. male POD 0 s/p L TKA. Patient reports IND with mobility at baseline. Patient is now limited by functional impairments (see PT problem list below) and requires S for bed mobility and min A x 2 for transfers. Patient was unable to safely ambulate at time of eval due to slow regression of anesthesia impacting L LE motor control, coordination and stability with weight acceptance. Patient instructed in exercise to facilitate ROM and circulation to manage edema.. Patient will benefit from continued skilled PT interventions to address impairments and progress towards PLOF. Acute PT will follow to progress mobility and stair training in preparation for safe discharge home with family support and OPPT services.       If plan is discharge home, recommend the following: A little help with walking and/or transfers;A little help with bathing/dressing/bathroom;Assistance with cooking/housework;Assist for transportation;Help with stairs or ramp for entrance   Can travel by private vehicle        Equipment Recommendations None recommended by PT  Recommendations for Other Services       Functional Status Assessment Patient has had a recent decline in their functional status and demonstrates the ability to make significant improvements in function in a reasonable and predictable amount of time.     Precautions / Restrictions Precautions Precautions: Knee;Fall Restrictions Weight Bearing Restrictions Per Provider Order: No       Mobility  Bed Mobility Overal bed mobility: Needs Assistance Bed Mobility: Supine to Sit     Supine to sit: Supervision, HOB elevated     General bed mobility comments: min cues    Transfers Overall transfer level: Needs assistance Equipment used: Rolling walker (2 wheels) Transfers: Sit to/from Stand Sit to Stand: Min assist, +2 physical assistance, +2 safety/equipment           General transfer comment: cues and min A x 2 due to L LE instability    Ambulation/Gait               General Gait Details: NT  Stairs            Wheelchair Mobility     Tilt Bed    Modified Rankin (Stroke Patients Only)       Balance Overall balance assessment: Needs assistance Sitting-balance support: Feet supported Sitting balance-Leahy Scale: Good     Standing balance support: Bilateral upper extremity supported, During functional activity, Reliant on assistive device for balance Standing balance-Leahy Scale: Poor                               Pertinent Vitals/Pain Pain Assessment Pain Assessment: 0-10 Pain Score: 0-No pain Pain Location: L LE and knee Pain Descriptors / Indicators:  (no pain) Pain Intervention(s): Limited activity within patient's tolerance, Monitored during session, Premedicated before session, Repositioned, Ice applied    Home Living Family/patient expects to be discharged to:: Private residence Living Arrangements: Spouse/significant other Available Help at Discharge: Family Type of Home: House Home Access: Stairs to enter;Ramped entrance  Entrance Stairs-Rails: None Entrance Stairs-Number of Steps: 1 Alternate Level Stairs-Number of Steps: flight Home Layout: Two level;Bed/bath upstairs Home Equipment: Rolling Walker (2 wheels);Rollator (4 wheels);Cane - single point Additional Comments: pt is primary caregiver 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 whom is disabled following COVID    Prior Function Prior Level of Function : Independent/Modified  Independent;Driving             Mobility Comments: mod I with use of SPC for all ADLs, self care tasks and IADLs       Extremity/Trunk Assessment        Lower Extremity Assessment Lower Extremity Assessment: LLE deficits/detail LLE Deficits / Details: ankle DF/PF 5/5; unable to perofrm SLR LLE Sensation: decreased light touch    Cervical / Trunk Assessment Cervical / Trunk Assessment: Normal  Communication   Communication Communication: No apparent difficulties    Cognition Arousal: Alert Behavior During Therapy: WFL for tasks assessed/performed   PT - Cognitive impairments: No apparent impairments                         Following commands: Intact       Cueing       General Comments      Exercises Total Joint Exercises Ankle Circles/Pumps: AROM, Both, 10 reps   Assessment/Plan    PT Assessment Patient needs continued PT services  PT Problem List Decreased strength;Decreased range of motion;Decreased activity tolerance;Decreased balance;Decreased mobility;Decreased coordination       PT Treatment Interventions DME instruction;Gait training;Functional mobility training;Therapeutic activities;Therapeutic exercise;Balance training;Neuromuscular re-education;Patient/family education;Modalities    PT Goals (Current goals can be found in the Care Plan section)  Acute Rehab PT Goals Patient Stated Goal: walk no pain and look after my wife PT Goal Formulation: With patient Time For Goal Achievement: 03/18/23 Potential to Achieve Goals: Good    Frequency 7X/week     Co-evaluation               AM-PAC PT "6 Clicks" Mobility  Outcome Measure Help needed turning from your back to your side while in a flat bed without using bedrails?: None Help needed moving from lying on your back to sitting on the side of a flat bed without using bedrails?: A Little Help needed moving to and from a bed to a chair (including a wheelchair)?: A Lot Help  needed standing up from a chair using your arms (e.g., wheelchair or bedside chair)?: A Lot Help needed to walk in hospital room?: Total Help needed climbing 3-5 steps with a railing? : Total 6 Click Score: 13    End of Session Equipment Utilized During Treatment: Gait belt Activity Tolerance: Patient tolerated treatment well;No increased pain Patient left: in chair;with call bell/phone within reach;with chair alarm set;with family/visitor present Nurse Communication: Mobility status (A x 2 for safety due to slow regression of spinal impacing L LE motor control and coordination) PT Visit Diagnosis: Unsteadiness on feet (R26.81);Muscle weakness (generalized) (M62.81);Other abnormalities of gait and mobility (R26.89);Difficulty in walking, not elsewhere classified (R26.2)    Time: 9629-5284 PT Time Calculation (min) (ACUTE ONLY): 29 min   Charges:   PT Evaluation $PT Eval Low Complexity: 1 Low PT Treatments $Therapeutic Activity: 8-22 mins PT General Charges $$ ACUTE PT VISIT: 1 Visit         Johnny Bridge, PT Acute Rehab   Jacqualyn Posey 03/05/2023, 6:25 PM

## 2023-03-05 NOTE — Transfer of Care (Signed)
 Immediate Anesthesia Transfer of Care Note  Patient: Jesse Mann  Procedure(s) Performed: TOTAL KNEE ARTHROPLASTY (Left: Knee)  Patient Location: PACU  Anesthesia Type:General  Level of Consciousness: awake, alert , and oriented  Airway & Oxygen Therapy: Patient Spontanous Breathing and Patient connected to nasal cannula oxygen  Post-op Assessment: Report given to RN and Post -op Vital signs reviewed and stable  Post vital signs: Reviewed and stable  Last Vitals:  Vitals Value Taken Time  BP 135/90 03/05/23 0939  Temp    Pulse 65 03/05/23 0941  Resp 14 03/05/23 0941  SpO2 99 % 03/05/23 0941  Vitals shown include unfiled device data.  Last Pain:  Vitals:   03/05/23 0617  TempSrc: Oral  PainSc:          Complications: No notable events documented.

## 2023-03-05 NOTE — Discharge Instructions (Addendum)
 INSTRUCTIONS AFTER JOINT REPLACEMENT   Remove items at home which could result in a fall. This includes throw rugs or furniture in walking pathways ICE to the affected joint every three hours while awake for 30 minutes at a time, for at least the first 3-5 days, and then as needed for pain and swelling.  Continue to use ice for pain and swelling. You may notice swelling that will progress down to the foot and ankle.  This is normal after surgery.  Elevate your leg when you are not up walking on it.   Continue to use the breathing machine you got in the hospital (incentive spirometer) which will help keep your temperature down.  It is common for your temperature to cycle up and down following surgery, especially at night when you are not up moving around and exerting yourself.  The breathing machine keeps your lungs expanded and your temperature down.  DIET:  As you were doing prior to hospitalization, we recommend a well-balanced diet.  DRESSING / WOUND CARE / SHOWERING:  Keep the surgical dressing until follow up.  The dressing is water proof, so you can shower without any extra covering.  IF THE DRESSING FALLS OFF or the wound gets wet inside, change the dressing with sterile gauze.  Please use good hand washing techniques before changing the dressing.  Do not use any lotions or creams on the incision until instructed by your surgeon.    ACTIVITY  Increase activity slowly as tolerated, but follow the weight bearing instructions below.   No driving for 6 weeks or until further direction given by your physician.  You cannot drive while taking narcotics.  No lifting or carrying greater than 10 lbs. until further directed by your surgeon. Avoid periods of inactivity such as sitting longer than an hour when not asleep. This helps prevent blood clots.  You may return to work once you are authorized by your doctor.   WEIGHT BEARING: Weight bearing as tolerated with assist device (walker, cane, etc) as  directed, use it as long as suggested by your surgeon or therapist, typically at least 4-6 weeks.  EXERCISES  Results after joint replacement surgery are often greatly improved when you follow the exercise, range of motion and muscle strengthening exercises prescribed by your doctor. Safety measures are also important to protect the joint from further injury. Any time any of these exercises cause you to have increased pain or swelling, decrease what you are doing until you are comfortable again and then slowly increase them. If you have problems or questions, call your caregiver or physical therapist for advice.   Rehabilitation is important following a joint replacement. After just a few days of immobilization, the muscles of the leg can become weakened and shrink (atrophy).  These exercises are designed to build up the tone and strength of the thigh and leg muscles and to improve motion. Often times heat used for twenty to thirty minutes before working out will loosen up your tissues and help with improving the range of motion but do not use heat for the first two weeks following surgery (sometimes heat can increase post-operative swelling).   These exercises can be done on a training (exercise) mat, on the floor, on a table or on a bed. Use whatever works the best and is most comfortable for you.    Use music or television while you are exercising so that the exercises are a pleasant break in your day. This will make your life  better with the exercises acting as a break in your routine that you can look forward to.   Perform all exercises about fifteen times, three times per day or as directed.  You should exercise both the operative leg and the other leg as well.  Exercises include:   Quad Sets - Tighten up the muscle on the front of the thigh (Quad) and hold for 5-10 seconds.   Straight Leg Raises - With your knee straight (if you were given a brace, keep it on), lift the leg to 60 degrees, hold  for 3 seconds, and slowly lower the leg.  Perform this exercise against resistance later as your leg gets stronger.  Leg Slides: Lying on your back, slowly slide your foot toward your buttocks, bending your knee up off the floor (only go as far as is comfortable). Then slowly slide your foot back down until your leg is flat on the floor again.  Angel Wings: Lying on your back spread your legs to the side as far apart as you can without causing discomfort.  Hamstring Strength:  Lying on your back, push your heel against the floor with your leg straight by tightening up the muscles of your buttocks.  Repeat, but this time bend your knee to a comfortable angle, and push your heel against the floor.  You may put a pillow under the heel to make it more comfortable if necessary.   A rehabilitation program following joint replacement surgery can speed recovery and prevent re-injury in the future due to weakened muscles. Contact your doctor or a physical therapist for more information on knee rehabilitation.   CONSTIPATION:  Constipation is defined medically as fewer than three stools per week and severe constipation as less than one stool per week.  Even if you have a regular bowel pattern at home, your normal regimen is likely to be disrupted due to multiple reasons following surgery.  Combination of anesthesia, postoperative narcotics, change in appetite and fluid intake all can affect your bowels.   YOU MUST use at least one of the following options; they are listed in order of increasing strength to get the job done.  They are all available over the counter, and you may need to use some, POSSIBLY even all of these options:    Drink plenty of fluids (prune juice may be helpful) and high fiber foods Colace 100 mg by mouth twice a day  Senokot for constipation as directed and as needed Dulcolax (bisacodyl), take with full glass of water  Miralax (polyethylene glycol) once or twice a day as needed.  If you  have tried all these things and are unable to have a bowel movement in the first 3-4 days after surgery call either your surgeon or your primary doctor.    If you experience loose stools or diarrhea, hold the medications until you stool forms back up.  If your symptoms do not get better within 1 week or if they get worse, check with your doctor.  If you experience "the worst abdominal pain ever" or develop nausea or vomiting, please contact the office immediately for further recommendations for treatment.  ITCHING:  If you experience itching with your medications, try taking only a single pain pill, or even half a pain pill at a time.  You can also use Benadryl over the counter for itching or also to help with sleep.   TED HOSE STOCKINGS:  Use stockings on both legs until for at least 2 weeks or  as directed by physician office. They may be removed at night for sleeping.  MEDICATIONS:  See your medication summary on the "After Visit Summary" that nursing will review with you.  You may have some home medications which will be placed on hold until you complete the course of blood thinner medication.  It is important for you to complete the blood thinner medication as prescribed.  Blood clot prevention (DVT Prophylaxis): After surgery you are at an increased risk for a blood clot. you were prescribed a blood thinner, Eliquis, to be taken twice daily for a total of 4 weeks from surgery to help reduce your risk of getting a blood clot.  Signs of a pulmonary embolus (blood clot in the lungs) include sudden short of breath, feeling lightheaded or dizzy, chest pain with a deep breath, rapid pulse rapid breathing.  Signs of a blood clot in your arms or legs include new unexplained swelling and cramping, warm, red or darkened skin around the painful area.  Please call the office or 911 right away if these signs or symptoms develop.  PRECAUTIONS:   If you experience chest pain or shortness of breath - call 911  immediately for transfer to the hospital emergency department.   If you develop a fever greater that 101 F, purulent drainage from wound, increased redness or drainage from wound, foul odor from the wound/dressing, or calf pain - CONTACT YOUR SURGEON.                                                   FOLLOW-UP APPOINTMENTS:  If you do not already have a post-op appointment, please call the office for an appointment to be seen by your surgeon.  Guidelines for how soon to be seen are listed in your "After Visit Summary", but are typically between 2-3 weeks after surgery.  If you have a specialized bandage, you may be told to follow up 1 week after surgery.  OTHER INSTRUCTIONS:  Knee Replacement:  Do not place pillow under knee, focus on keeping the knee straight while resting.  Place foam block, curve side up under heel at all times except when walking.  DO NOT modify, tear, cut, or change the foam block in any way.  POST-OPERATIVE OPIOID TAPER INSTRUCTIONS: It is important to wean off of your opioid medication as soon as possible. If you do not need pain medication after your surgery it is ok to stop day one. Opioids include: Codeine, Hydrocodone(Norco, Vicodin), Oxycodone(Percocet, oxycontin) and hydromorphone amongst others.  Long term and even short term use of opiods can cause: Increased pain response Dependence Constipation Depression Respiratory depression And more.  Withdrawal symptoms can include Flu like symptoms Nausea, vomiting And more Techniques to manage these symptoms Hydrate well Eat regular healthy meals Stay active Use relaxation techniques(deep breathing, meditating, yoga) Do Not substitute Alcohol to help with tapering If you have been on opioids for less than two weeks and do not have pain than it is ok to stop all together.  Plan to wean off of opioids This plan should start within one week post op of your joint replacement. Maintain the same interval or time  between taking each dose and first decrease the dose.  Cut the total daily intake of opioids by one tablet each day Next start to increase the time between doses. The  last dose that should be eliminated is the evening dose.   MAKE SURE YOU:  Understand these instructions.  Get help right away if you are not doing well or get worse.    Thank you for letting us be a part of your medical care team.  It is a privilege we respect greatly.  We hope these instructions will help you stay on track for a fast and full recovery!    Information on my medicine - ELIQUIS (apixaban) This medication education was reviewed with me or my healthcare representative as part of my discharge preparation.   Why was Eliquis prescribed for you? Eliquis was prescribed for you to reduce the risk of blood clots forming after orthopedic surgery.    What do You need to know about Eliquis? Take your Eliquis TWICE DAILY - one tablet in the morning and one tablet in the evening with or without food.  It would be best to take the dose about the same time each day.  If you have difficulty swallowing the tablet whole please discuss with your pharmacist how to take the medication safely.  Take Eliquis exactly as prescribed by your doctor and DO NOT stop taking Eliquis without talking to the doctor who prescribed the medication.  Stopping without other medication to take the place of Eliquis may increase your risk of developing a clot.  After discharge, you should have regular check-up appointments with your healthcare provider that is prescribing your Eliquis.  What do you do if you miss a dose? If a dose of ELIQUIS is not taken at the scheduled time, take it as soon as possible on the same day and twice-daily administration should be resumed.  The dose should not be doubled to make up for a missed dose.  Do not take more than one tablet of ELIQUIS at the same time.  Important Safety Information A possible side  effect of Eliquis is bleeding. You should call your healthcare provider right away if you experience any of the following: Bleeding from an injury or your nose that does not stop. Unusual colored urine (red or dark brown) or unusual colored stools (red or black). Unusual bruising for unknown reasons. A serious fall or if you hit your head (even if there is no bleeding).  Some medicines may interact with Eliquis and might increase your risk of bleeding or clotting while on Eliquis. To help avoid this, consult your healthcare provider or pharmacist prior to using any new prescription or non-prescription medications, including herbals, vitamins, non-steroidal anti-inflammatory drugs (NSAIDs) and supplements.  This website has more information on Eliquis (apixaban): http://[email or web address].com/eliquis/home

## 2023-03-06 ENCOUNTER — Encounter (HOSPITAL_COMMUNITY): Payer: Self-pay | Admitting: Orthopedic Surgery

## 2023-03-06 ENCOUNTER — Other Ambulatory Visit (HOSPITAL_COMMUNITY): Payer: Self-pay

## 2023-03-06 DIAGNOSIS — N183 Chronic kidney disease, stage 3 unspecified: Secondary | ICD-10-CM | POA: Diagnosis not present

## 2023-03-06 DIAGNOSIS — I130 Hypertensive heart and chronic kidney disease with heart failure and stage 1 through stage 4 chronic kidney disease, or unspecified chronic kidney disease: Secondary | ICD-10-CM | POA: Diagnosis not present

## 2023-03-06 DIAGNOSIS — Z7901 Long term (current) use of anticoagulants: Secondary | ICD-10-CM | POA: Diagnosis not present

## 2023-03-06 DIAGNOSIS — Z96651 Presence of right artificial knee joint: Secondary | ICD-10-CM | POA: Diagnosis not present

## 2023-03-06 DIAGNOSIS — M1712 Unilateral primary osteoarthritis, left knee: Secondary | ICD-10-CM | POA: Diagnosis not present

## 2023-03-06 DIAGNOSIS — Z950 Presence of cardiac pacemaker: Secondary | ICD-10-CM | POA: Diagnosis not present

## 2023-03-06 DIAGNOSIS — I509 Heart failure, unspecified: Secondary | ICD-10-CM | POA: Diagnosis not present

## 2023-03-06 DIAGNOSIS — I2510 Atherosclerotic heart disease of native coronary artery without angina pectoris: Secondary | ICD-10-CM | POA: Diagnosis not present

## 2023-03-06 DIAGNOSIS — Z8546 Personal history of malignant neoplasm of prostate: Secondary | ICD-10-CM | POA: Diagnosis not present

## 2023-03-06 LAB — BASIC METABOLIC PANEL
Anion gap: 9 (ref 5–15)
BUN: 29 mg/dL — ABNORMAL HIGH (ref 8–23)
CO2: 21 mmol/L — ABNORMAL LOW (ref 22–32)
Calcium: 8.1 mg/dL — ABNORMAL LOW (ref 8.9–10.3)
Chloride: 102 mmol/L (ref 98–111)
Creatinine, Ser: 1.37 mg/dL — ABNORMAL HIGH (ref 0.61–1.24)
GFR, Estimated: 52 mL/min — ABNORMAL LOW (ref >60)
Glucose, Bld: 152 mg/dL — ABNORMAL HIGH (ref 70–99)
Potassium: 4.2 mmol/L (ref 3.5–5.1)
Sodium: 132 mmol/L — ABNORMAL LOW (ref 135–145)

## 2023-03-06 LAB — CBC
HCT: 36.6 % — ABNORMAL LOW (ref 39.0–52.0)
Hemoglobin: 11.6 g/dL — ABNORMAL LOW (ref 13.0–17.0)
MCH: 30.4 pg (ref 26.0–34.0)
MCHC: 31.7 g/dL (ref 30.0–36.0)
MCV: 96.1 fL (ref 80.0–100.0)
Platelets: 166 10*3/uL (ref 150–400)
RBC: 3.81 MIL/uL — ABNORMAL LOW (ref 4.22–5.81)
RDW: 13.2 % (ref 11.5–15.5)
WBC: 9.5 10*3/uL (ref 4.0–10.5)
nRBC: 0 % (ref 0.0–0.2)

## 2023-03-06 NOTE — Plan of Care (Signed)
  Problem: Activity: Goal: Risk for activity intolerance will decrease Outcome: Progressing   Problem: Pain Managment: Goal: General experience of comfort will improve and/or be controlled Outcome: Progressing   Problem: Skin Integrity: Goal: Risk for impaired skin integrity will decrease Outcome: Progressing

## 2023-03-06 NOTE — Discharge Summary (Signed)
 Physician Discharge Summary  Patient ID: CIEL YANES MRN: [id number] DOB/AGE: 81-Oct-1942 81 y.o.  Admit date: 03/05/2023 Discharge date: 03/06/2023  Admission Diagnoses:  Primary osteoarthritis of left knee  Discharge Diagnoses:  Principal Problem:   Primary osteoarthritis of left knee   Past Medical History:  Diagnosis Date   AICD (automatic cardioverter/defibrillator) present    Anemia    Arthritis    fingers- right (2-4 fingers with ROM issues).knee left   Bifascicular block    Cardiac arrest (HCC) [facility]   Cardiac arrhythmia    Dr. Revankar,cardiology [phone] [facility])   Cardiac murmur    Cardiomyopathy [facility])    Ischemic   CHF (congestive heart failure) (HCC)    Chronic kidney disease    CKD3   Coronary artery disease    Flail chest    GERD (gastroesophageal reflux disease)    Hemorrhoids    History of tracheostomy    History of ventricular fibrillation 07/22/2016   Hypercholesteremia    Hypertension    Left anterior fascicular block (LAFB)    Myocardial infarction (HCC)    7'14- [facility]- "feeling strange" eavaluated, heart cath done with stents x2 placed   Pneumonia    history of   Presence of permanent cardiac pacemaker    Prostate cancer (HCC)    PVC (premature ventricular contraction)    RBBB    Recurrent dry cough    intermittent daily -occ. production   Sleep apnea     Surgeries: Procedure(s): TOTAL KNEE ARTHROPLASTY on 03/05/2023   Consultants (if any):   Discharged Condition: Improved  Hospital Course: Jesse Mann is an 83 y.o. male who was admitted 03/05/2023 with a diagnosis of Primary osteoarthritis of left knee and went to the operating room on 03/05/2023 and underwent the above named procedures.    He was given perioperative antibiotics:  Anti-infectives (From admission, onward)    Start     Dose/Rate Route Frequency Ordered Stop    03/05/23 1400  ceFAZolin (ANCEF) IVPB 2g/100 mL premix        2 g 200 mL/hr over 30 Minutes Intravenous Every 6 hours 03/05/23 1148 03/05/23 2240   03/05/23 0600  ceFAZolin (ANCEF) IVPB 2g/100 mL premix        2 g 200 mL/hr over 30 Minutes Intravenous On call to O.R. 03/05/23 7829 03/05/23 0735     .  He was given sequential compression devices, early ambulation, and Eliquis for DVT prophylaxis.  He benefited maximally from the hospital stay and there were no complications.    Recent vital signs:  Vitals:   03/06/23 0112 03/06/23 0527  BP: 115/62 (!) 140/70  Pulse: (!) 49 60  Resp: 18 18  Temp: 97.7 F (36.5 C) 98.6 F (37 C)  SpO2: 100% 97%    Recent laboratory studies:  Lab Results  Component Value Date   HGB 11.6 (L) 03/06/2023   HGB 14.6 02/25/2023   HGB 14.4 02/20/2023   Lab Results  Component Value Date   WBC 9.5 03/06/2023   PLT 166 03/06/2023   Lab Results  Component Value Date   INR 1.17 04/16/2017   Lab Results  Component Value Date   NA 132 (L) 03/06/2023   K 4.2 03/06/2023   CL 102 03/06/2023   CO2 21 (L) 03/06/2023   BUN 29 (H) 03/06/2023   CREATININE 1.37 (H) 03/06/2023   GLUCOSE 152 (H) 03/06/2023    Discharge  Medications:   Allergies as of 03/06/2023   No Known Allergies      Medication List     STOP taking these medications    aspirin EC 81 MG tablet       TAKE these medications    acetaminophen 500 MG tablet Commonly known as: TYLENOL Take 2 tablets (1,000 mg total) by mouth every 8 (eight) hours as needed.   amiodarone 200 MG tablet Commonly known as: PACERONE Take 200 mg by mouth daily.   apixaban 2.5 MG Tabs tablet Commonly known as: Eliquis Take 1 tablet (2.5 mg total) by mouth 2 (two) times daily.   carvedilol 12.5 MG tablet Commonly known as: COREG Take 12.5 mg by mouth 2 (two) times daily with a meal.   celecoxib 100 MG capsule Commonly known as: CeleBREX Take 1 capsule (100 mg total) by mouth 2 (two)  times daily for 14 days.   chlorhexidine 4 % external liquid Commonly known as: HIBICLENS Apply 15 mLs (1 Application total) topically as directed for 30 doses. Use as directed daily for 5 days every other week for 6 weeks.   cyanocobalamin 1000 MCG tablet Commonly known as: VITAMIN B12 Take 1,000 mcg by mouth daily.   famotidine 40 MG tablet Commonly known as: PEPCID Take 40 mg at bedtime by mouth.   Farxiga 10 MG Tabs tablet Generic drug: dapagliflozin propanediol Take 10 mg by mouth daily.   magnesium oxide 400 (240 Mg) MG tablet Commonly known as: MAG-OX Take 400 mg by mouth daily.   methocarbamol 500 MG tablet Commonly known as: ROBAXIN Take 1 tablet (500 mg total) by mouth every 8 (eight) hours as needed for up to 10 days for muscle spasms.   mupirocin ointment 2 % Commonly known as: BACTROBAN Place 1 Application into the nose 2 (two) times daily for 60 doses. Use as directed 2 times daily for 5 days every other week for 6 weeks.   nitroGLYCERIN 0.4 MG SL tablet Commonly known as: NITROSTAT Place 0.4 mg under the tongue every 5 (five) minutes as needed for chest pain.   omeprazole 40 MG capsule Commonly known as: PRILOSEC Take 1 capsule (40 mg total) by mouth daily for 21 days.   ondansetron 4 MG tablet Commonly known as: Zofran Take 1 tablet (4 mg total) by mouth every 8 (eight) hours as needed for up to 14 days for nausea or vomiting.   oxyCODONE 5 MG immediate release tablet Commonly known as: Roxicodone Take 0.5-1 tablets (2.5-5 mg total) by mouth every 6 (six) hours as needed for up to 7 days for severe pain (pain score 7-10) or moderate pain (pain score 4-6).   polyethylene glycol 17 g packet Commonly known as: MiraLax Take 17 g by mouth daily.   rosuvastatin 10 MG tablet Commonly known as: CRESTOR Take 10 mg by mouth daily.        Diagnostic Studies: DG Knee Left Port Result Date: 03/05/2023 CLINICAL DATA:  Postoperative left knee. EXAM:  PORTABLE LEFT KNEE - 1-2 VIEW COMPARISON:  None Available. FINDINGS: Interval total left knee arthroplasty. No perihardware lucency is seen to indicate hardware failure or loosening. Expected postoperative changes including intra-articular and subcutaneous air. Smalljoint effusion. No acute fracture or dislocation. Moderate atherosclerotic calcifications. IMPRESSION: Interval total left knee arthroplasty without evidence of hardware failure. Electronically Signed   By: Neita Garnet M.D.   On: 03/05/2023 12:09    Disposition: Discharge disposition: 01-Home or Self Care       Discharge  Instructions     Call MD / Call 911   Complete by: As directed    If you experience chest pain or shortness of breath, CALL 911 and be transported to the hospital emergency room.  If you develope a fever above 101 F, pus (white drainage) or increased drainage or redness at the wound, or calf pain, call your surgeon's office.   Constipation Prevention   Complete by: As directed    Drink plenty of fluids.  Prune juice may be helpful.  You may use a stool softener, such as Colace (over the counter) 100 mg twice a day.  Use MiraLax (over the counter) for constipation as needed.   Diet - low sodium heart healthy   Complete by: As directed    Do not put a pillow under the knee. Place it under the heel.   Complete by: As directed    Driving restrictions   Complete by: As directed    No driving for 2-4 weeks   Increase activity slowly as tolerated   Complete by: As directed    Post-operative opioid taper instructions:   Complete by: As directed    POST-OPERATIVE OPIOID TAPER INSTRUCTIONS: It is important to wean off of your opioid medication as soon as possible. If you do not need pain medication after your surgery it is ok to stop day one. Opioids include: Codeine, Hydrocodone(Norco, Vicodin), Oxycodone(Percocet, oxycontin) and hydromorphone amongst others.  Long term and even short term use of opiods can  cause: Increased pain response Dependence Constipation Depression Respiratory depression And more.  Withdrawal symptoms can include Flu like symptoms Nausea, vomiting And more Techniques to manage these symptoms Hydrate well Eat regular healthy meals Stay active Use relaxation techniques(deep breathing, meditating, yoga) Do Not substitute Alcohol to help with tapering If you have been on opioids for less than two weeks and do not have pain than it is ok to stop all together.  Plan to wean off of opioids This plan should start within one week post op of your joint replacement. Maintain the same interval or time between taking each dose and first decrease the dose.  Cut the total daily intake of opioids by one tablet each day Next start to increase the time between doses. The last dose that should be eliminated is the evening dose.      TED hose   Complete by: As directed    Use stockings (TED hose) for 2 weeks on both leg(s).  Then for 2 more weeks on the surgical leg.  You may remove them at night for sleeping.        Follow-up Information     Joen Laura, MD. Go on 03/20/2023.   Specialty: Orthopedic Surgery Why: Your appointment is scheduled for 2:30. Contact information: [address] [place] [address] [phone]         Re[facility], [place]. Go on 03/07/2023.   Why: your appointment is scheduled. they will call you with a time. Contact information: [address] [place] [address] [phone]                    Discharge Instructions      INSTRUCTIONS AFTER JOINT REPLACEMENT   Remove items at home which could result in a fall. This includes throw rugs or furniture in walking pathways ICE to the affected joint every three hours while awake for 30 minutes at a time, for at least the first  3-5 days, and then as needed for pain and swelling.  Continue to use ice for pain and swelling. You may notice  swelling that will progress down to the foot and ankle.  This is normal after surgery.  Elevate your leg when you are not up walking on it.   Continue to use the breathing machine you got in the hospital (incentive spirometer) which will help keep your temperature down.  It is common for your temperature to cycle up and down following surgery, especially at night when you are not up moving around and exerting yourself.  The breathing machine keeps your lungs expanded and your temperature down.  DIET:  As you were doing prior to hospitalization, we recommend a well-balanced diet.  DRESSING / WOUND CARE / SHOWERING:  Keep the surgical dressing until follow up.  The dressing is water proof, so you can shower without any extra covering.  IF THE DRESSING FALLS OFF or the wound gets wet inside, change the dressing with sterile gauze.  Please use good hand washing techniques before changing the dressing.  Do not use any lotions or creams on the incision until instructed by your surgeon.    ACTIVITY  Increase activity slowly as tolerated, but follow the weight bearing instructions below.   No driving for 6 weeks or until further direction given by your physician.  You cannot drive while taking narcotics.  No lifting or carrying greater than 10 lbs. until further directed by your surgeon. Avoid periods of inactivity such as sitting longer than an hour when not asleep. This helps prevent blood clots.  You may return to work once you are authorized by your doctor.   WEIGHT BEARING: Weight bearing as tolerated with assist device (walker, cane, etc) as directed, use it as long as suggested by your surgeon or therapist, typically at least 4-6 weeks.  EXERCISES  Results after joint replacement surgery are often greatly improved when you follow the exercise, range of motion and muscle strengthening exercises prescribed by your doctor. Safety measures are also important to protect the joint from further injury.  Any time any of these exercises cause you to have increased pain or swelling, decrease what you are doing until you are comfortable again and then slowly increase them. If you have problems or questions, call your caregiver or physical therapist for advice.   Rehabilitation is important following a joint replacement. After just a few days of immobilization, the muscles of the leg can become weakened and shrink (atrophy).  These exercises are designed to build up the tone and strength of the thigh and leg muscles and to improve motion. Often times heat used for twenty to thirty minutes before working out will loosen up your tissues and help with improving the range of motion but do not use heat for the first two weeks following surgery (sometimes heat can increase post-operative swelling).   These exercises can be done on a training (exercise) mat, on the floor, on a table or on a bed. Use whatever works the best and is most comfortable for you.    Use music or television while you are exercising so that the exercises are a pleasant break in your day. This will make your life better with the exercises acting as a break in your routine that you can look forward to.   Perform all exercises about fifteen times, three times per day or as directed.  You should exercise both the operative leg and the other leg as  well.  Exercises include:   Quad Sets - Tighten up the muscle on the front of the thigh (Quad) and hold for 5-10 seconds.   Straight Leg Raises - With your knee straight (if you were given a brace, keep it on), lift the leg to 60 degrees, hold for 3 seconds, and slowly lower the leg.  Perform this exercise against resistance later as your leg gets stronger.  Leg Slides: Lying on your back, slowly slide your foot toward your buttocks, bending your knee up off the floor (only go as far as is comfortable). Then slowly slide your foot back down until your leg is flat on the floor again.  Angel Wings: Lying  on your back spread your legs to the side as far apart as you can without causing discomfort.  Hamstring Strength:  Lying on your back, push your heel against the floor with your leg straight by tightening up the muscles of your buttocks.  Repeat, but this time bend your knee to a comfortable angle, and push your heel against the floor.  You may put a pillow under the heel to make it more comfortable if necessary.   A rehabilitation program following joint replacement surgery can speed recovery and prevent re-injury in the future due to weakened muscles. Contact your doctor or a physical therapist for more information on knee rehabilitation.   CONSTIPATION:  Constipation is defined medically as fewer than three stools per week and severe constipation as less than one stool per week.  Even if you have a regular bowel pattern at home, your normal regimen is likely to be disrupted due to multiple reasons following surgery.  Combination of anesthesia, postoperative narcotics, change in appetite and fluid intake all can affect your bowels.   YOU MUST use at least one of the following options; they are listed in order of increasing strength to get the job done.  They are all available over the counter, and you may need to use some, POSSIBLY even all of these options:    Drink plenty of fluids (prune juice may be helpful) and high fiber foods Colace 100 mg by mouth twice a day  Senokot for constipation as directed and as needed Dulcolax (bisacodyl), take with full glass of water  Miralax (polyethylene glycol) once or twice a day as needed.  If you have tried all these things and are unable to have a bowel movement in the first 3-4 days after surgery call either your surgeon or your primary doctor.    If you experience loose stools or diarrhea, hold the medications until you stool forms back up.  If your symptoms do not get better within 1 week or if they get worse, check with your doctor.  If you experience  "the worst abdominal pain ever" or develop nausea or vomiting, please contact the office immediately for further recommendations for treatment.  ITCHING:  If you experience itching with your medications, try taking only a single pain pill, or even half a pain pill at a time.  You can also use Benadryl over the counter for itching or also to help with sleep.   TED HOSE STOCKINGS:  Use stockings on both legs until for at least 2 weeks or as directed by physician office. They may be removed at night for sleeping.  MEDICATIONS:  See your medication summary on the "After Visit Summary" that nursing will review with you.  You may have some home medications which will be placed on hold until  you complete the course of blood thinner medication.  It is important for you to complete the blood thinner medication as prescribed.  Blood clot prevention (DVT Prophylaxis): After surgery you are at an increased risk for a blood clot. you were prescribed a blood thinner, Eliquis, to be taken twice daily for a total of 4 weeks from surgery to help reduce your risk of getting a blood clot.  Signs of a pulmonary embolus (blood clot in the lungs) include sudden short of breath, feeling lightheaded or dizzy, chest pain with a deep breath, rapid pulse rapid breathing.  Signs of a blood clot in your arms or legs include new unexplained swelling and cramping, warm, red or darkened skin around the painful area.  Please call the office or 911 right away if these signs or symptoms develop.  PRECAUTIONS:   If you experience chest pain or shortness of breath - call 911 immediately for transfer to the hospital emergency department.   If you develop a fever greater that 101 F, purulent drainage from wound, increased redness or drainage from wound, foul odor from the wound/dressing, or calf pain - CONTACT YOUR SURGEON.                                                   FOLLOW-UP APPOINTMENTS:  If you do not already have a post-op  appointment, please call the office for an appointment to be seen by your surgeon.  Guidelines for how soon to be seen are listed in your "After Visit Summary", but are typically between 2-3 weeks after surgery.  If you have a specialized bandage, you may be told to follow up 1 week after surgery.  OTHER INSTRUCTIONS:  Knee Replacement:  Do not place pillow under knee, focus on keeping the knee straight while resting.  Place foam block, curve side up under heel at all times except when walking.  DO NOT modify, tear, cut, or change the foam block in any way.  POST-OPERATIVE OPIOID TAPER INSTRUCTIONS: It is important to wean off of your opioid medication as soon as possible. If you do not need pain medication after your surgery it is ok to stop day one. Opioids include: Codeine, Hydrocodone(Norco, Vicodin), Oxycodone(Percocet, oxycontin) and hydromorphone amongst others.  Long term and even short term use of opiods can cause: Increased pain response Dependence Constipation Depression Respiratory depression And more.  Withdrawal symptoms can include Flu like symptoms Nausea, vomiting And more Techniques to manage these symptoms Hydrate well Eat regular healthy meals Stay active Use relaxation techniques(deep breathing, meditating, yoga) Do Not substitute Alcohol to help with tapering If you have been on opioids for less than two weeks and do not have pain than it is ok to stop all together.  Plan to wean off of opioids This plan should start within one week post op of your joint replacement. Maintain the same interval or time between taking each dose and first decrease the dose.  Cut the total daily intake of opioids by one tablet each day Next start to increase the time between doses. The last dose that should be eliminated is the evening dose.   MAKE SURE YOU:  Understand these instructions.  Get help right away if you are not doing well or get worse.    Thank you for letting us  be a part of  your medical care team.  It is a privilege we respect greatly.  We hope these instructions will help you stay on track for a fast and full recovery!    Information on my medicine - ELIQUIS (apixaban) This medication education was reviewed with me or my healthcare representative as part of my discharge preparation.   Why was Eliquis prescribed for you? Eliquis was prescribed for you to reduce the risk of blood clots forming after orthopedic surgery.    What do You need to know about Eliquis? Take your Eliquis TWICE DAILY - one tablet in the morning and one tablet in the evening with or without food.  It would be best to take the dose about the same time each day.  If you have difficulty swallowing the tablet whole please discuss with your pharmacist how to take the medication safely.  Take Eliquis exactly as prescribed by your doctor and DO NOT stop taking Eliquis without talking to the doctor who prescribed the medication.  Stopping without other medication to take the place of Eliquis may increase your risk of developing a clot.  After discharge, you should have regular check-up appointments with your healthcare provider that is prescribing your Eliquis.  What do you do if you miss a dose? If a dose of ELIQUIS is not taken at the scheduled time, take it as soon as possible on the same day and twice-daily administration should be resumed.  The dose should not be doubled to make up for a missed dose.  Do not take more than one tablet of ELIQUIS at the same time.  Important Safety Information A possible side effect of Eliquis is bleeding. You should call your healthcare provider right away if you experience any of the following: Bleeding from an injury or your nose that does not stop. Unusual colored urine (red or dark brown) or unusual colored stools (red or black). Unusual bruising for unknown reasons. A serious fall or if you hit your head (even if there is no  bleeding).  Some medicines may interact with Eliquis and might increase your risk of bleeding or clotting while on Eliquis. To help avoid this, consult your healthcare provider or pharmacist prior to using any new prescription or non-prescription medications, including herbals, vitamins, non-steroidal anti-inflammatory drugs (NSAIDs) and supplements.  This website has more information on Eliquis (apixaban): http://www.eliquis.com/eliquis/home         Signed: Naquan Garman A Knowledge Escandon 03/06/2023, 7:14 AM

## 2023-03-06 NOTE — Progress Notes (Signed)
 Physical Therapy Treatment Patient Details Name: Jesse Mann MRN: [id number] DOB: 06-22-1940 Today's Date: 03/06/2023   History of Present Illness 83 yo male presents to therapy s/p L TKA on 03/05/2023 due to failure of conservative measures. Pt PMH includes but is not limited to: defibrillator in situ, MI, CHF, CAD, GERD, prostate ca s/p prostatectomy, HTN, HLD, OSA, R BBB, back surgery and R TKA on 12/11/2022.    PT Comments  Pt continues motivated and with noted decrease in extensor lag on L and improved stability.  Pt up to ambulate increased distance in hall and negotiated stairs - no buckling/LOB on flat surface but two episodes on stairs.  Pt reports he will stay on sofa on main level for first days until strong enough to attempt flight to bedroom safely.  Pt eager for dc home this date and to OP PT tomorrow.    If plan is discharge home, recommend the following: A little help with walking and/or transfers;A little help with bathing/dressing/bathroom;Assistance with cooking/housework;Assist for transportation;Help with stairs or ramp for entrance   Can travel by private vehicle        Equipment Recommendations  None recommended by PT    Recommendations for Other Services       Precautions / Restrictions Precautions Precautions: Knee;Fall Restrictions Weight Bearing Restrictions Per Provider Order: No     Mobility  Bed Mobility Overal bed mobility: Needs Assistance Bed Mobility: Supine to Sit     Supine to sit: Supervision, HOB elevated     General bed mobility comments: Up in chair and requests back to same    Transfers Overall transfer level: Needs assistance Equipment used: Rolling walker (2 wheels) Transfers: Sit to/from Stand Sit to Stand: Contact guard assist           General transfer comment: cues for LE management and use of UEs to self assist    Ambulation/Gait Ambulation/Gait assistance: Contact guard assist Gait Distance (Feet): 125  Feet Assistive device: Rolling walker (2 wheels) Gait Pattern/deviations: Step-to pattern, Decreased step length - right, Decreased step length - left, Shuffle, Trunk flexed       General Gait Details: cues for sequence, posture and position from RW; noted improvement in stability and safety awareness   Stairs Stairs: Yes Stairs assistance: Min assist Stair Management: One rail Right, Step to pattern, Forwards, With cane Number of Stairs: 4 General stair comments: cues for sequence, cane placement; 2 episodes mild buckling on stairs requiring assist to prevent falling   Wheelchair Mobility     Tilt Bed    Modified Rankin (Stroke Patients Only)       Balance Overall balance assessment: Needs assistance Sitting-balance support: Feet supported Sitting balance-Leahy Scale: Good     Standing balance support: During functional activity, Reliant on assistive device for balance, Single extremity supported Standing balance-Leahy Scale: Poor                              Communication Communication Communication: No apparent difficulties  Cognition Arousal: Alert Behavior During Therapy: WFL for tasks assessed/performed   PT - Cognitive impairments: No apparent impairments                         Following commands: Intact      Cueing    Exercises Total Joint Exercises Ankle Circles/Pumps: AROM, Both, 10 reps Quad Sets: AROM, Both, 15 reps, Supine Heel Slides: AAROM,  Left, 15 reps, Supine Straight Leg Raises: AAROM, Left, 15 reps, Supine    General Comments        Pertinent Vitals/Pain Pain Assessment Pain Assessment: 0-10 Pain Score: 2  Pain Location: Posterior aspect of L knee Pain Descriptors / Indicators: Aching, Sore Pain Intervention(s): Limited activity within patient's tolerance, Monitored during session, Premedicated before session, Ice applied    Home Living                          Prior Function             PT Goals (current goals can now be found in the care plan section) Acute Rehab PT Goals Patient Stated Goal: walk no pain and look after my wife PT Goal Formulation: With patient Time For Goal Achievement: 03/18/23 Potential to Achieve Goals: Good Progress towards PT goals: Progressing toward goals    Frequency    7X/week      PT Plan      Co-evaluation              AM-PAC PT "6 Clicks" Mobility   Outcome Measure  Help needed turning from your back to your side while in a flat bed without using bedrails?: None Help needed moving from lying on your back to sitting on the side of a flat bed without using bedrails?: A Little Help needed moving to and from a bed to a chair (including a wheelchair)?: A Little Help needed standing up from a chair using your arms (e.g., wheelchair or bedside chair)?: A Little Help needed to walk in hospital room?: A Little Help needed climbing 3-5 steps with a railing? : A Little 6 Click Score: 19    End of Session Equipment Utilized During Treatment: Gait belt Activity Tolerance: Patient tolerated treatment well;No increased pain Patient left: in chair;with call bell/phone within reach;with chair alarm set Nurse Communication: Mobility status PT Visit Diagnosis: Unsteadiness on feet (R26.81);Muscle weakness (generalized) (M62.81);Other abnormalities of gait and mobility (R26.89);Difficulty in walking, not elsewhere classified (R26.2)     Time: 0102-7253 PT Time Calculation (min) (ACUTE ONLY): 30 min  Charges:    $Gait Training: 8-22 mins $Therapeutic Exercise: 8-22 mins $Therapeutic Activity: 8-22 mins PT General Charges $$ ACUTE PT VISIT: 1 Visit                     [name] PT Acute Rehabilitation Services Pager [phone] Office [phone]    Cordell Guercio 03/06/2023, 3:15 PM

## 2023-03-06 NOTE — Progress Notes (Signed)
 Physical Therapy Treatment Patient Details Name: Jesse Mann MRN: [id number] DOB: [birth year] Today's Date: 03/06/2023   History of Present Illness 83 yo male presents to therapy s/p L TKA on 03/05/2023 due to failure of conservative measures. Pt PMH includes but is not limited to: defibrillator in situ, MI, CHF, CAD, GERD, prostate ca s/p prostatectomy, HTN, HLD, OSA, R BBB, back surgery and R TKA on 12/11/2022.    PT Comments  Pt very motivated and progressing with mobility but with noted continued instability and one episode buckling at L knee with WB.  Pt hopeful for dc home this date.    If plan is discharge home, recommend the following: A little help with walking and/or transfers;A little help with bathing/dressing/bathroom;Assistance with cooking/housework;Assist for transportation;Help with stairs or ramp for entrance   Can travel by private vehicle        Equipment Recommendations  None recommended by PT    Recommendations for Other Services Rehab consult     Precautions / Restrictions Precautions Precautions: Knee;Fall Restrictions Weight Bearing Restrictions Per Provider Order: No     Mobility  Bed Mobility Overal bed mobility: Needs Assistance Bed Mobility: Supine to Sit     Supine to sit: Supervision, HOB elevated     General bed mobility comments: min cues    Transfers Overall transfer level: Needs assistance Equipment used: Rolling walker (2 wheels) Transfers: Sit to/from Stand Sit to Stand: Min assist           General transfer comment: cues for LE management and use of UEs to self assist    Ambulation/Gait Ambulation/Gait assistance: Min assist Gait Distance (Feet): 95 Feet Assistive device: Rolling walker (2 wheels) Gait Pattern/deviations: Step-to pattern, Decreased step length - right, Decreased step length - left, Shuffle, Trunk flexed       General Gait Details: cues for sequence, posture and position from RW; one episode L knee  buckling requiring assist to prevent fall   Stairs             Wheelchair Mobility     Tilt Bed    Modified Rankin (Stroke Patients Only)       Balance Overall balance assessment: Needs assistance Sitting-balance support: Feet supported Sitting balance-Leahy Scale: Good     Standing balance support: Bilateral upper extremity supported, During functional activity, Reliant on assistive device for balance Standing balance-Leahy Scale: Poor                              Communication Communication Communication: No apparent difficulties  Cognition Arousal: Alert Behavior During Therapy: WFL for tasks assessed/performed   PT - Cognitive impairments: No apparent impairments                         Following commands: Intact      Cueing    Exercises Total Joint Exercises Ankle Circles/Pumps: AROM, Both, 10 reps Quad Sets: AROM, Both, 15 reps, Supine Heel Slides: AAROM, Left, 15 reps, Supine Straight Leg Raises: AAROM, Left, 15 reps, Supine    General Comments        Pertinent Vitals/Pain Pain Assessment Pain Assessment: 0-10 Pain Score: 2  Pain Location: Posterior aspect of L knee Pain Descriptors / Indicators: Aching, Sore Pain Intervention(s): Limited activity within patient's tolerance, Monitored during session, Premedicated before session, Ice applied    Home Living  Prior Function            PT Goals (current goals can now be found in the care plan section) Acute Rehab PT Goals Patient Stated Goal: walk no pain and look after my wife PT Goal Formulation: With patient Time For Goal Achievement: 03/18/23 Potential to Achieve Goals: Good Progress towards PT goals: Progressing toward goals    Frequency    7X/week      PT Plan      Co-evaluation              AM-PAC PT "6 Clicks" Mobility   Outcome Measure  Help needed turning from your back to your side while in a flat  bed without using bedrails?: None Help needed moving from lying on your back to sitting on the side of a flat bed without using bedrails?: A Little Help needed moving to and from a bed to a chair (including a wheelchair)?: A Little Help needed standing up from a chair using your arms (e.g., wheelchair or bedside chair)?: A Little Help needed to walk in hospital room?: A Little Help needed climbing 3-5 steps with a railing? : A Lot 6 Click Score: 18    End of Session Equipment Utilized During Treatment: Gait belt Activity Tolerance: Patient tolerated treatment well;No increased pain Patient left: in chair;with call bell/phone within reach;with chair alarm set;with family/visitor present Nurse Communication: Mobility status PT Visit Diagnosis: Unsteadiness on feet (R26.81);Muscle weakness (generalized) (M62.81);Other abnormalities of gait and mobility (R26.89);Difficulty in walking, not elsewhere classified (R26.2)     Time: 4098-1191 PT Time Calculation (min) (ACUTE ONLY): 29 min  Charges:    $Gait Training: 8-22 mins $Therapeutic Exercise: 8-22 mins PT General Charges $$ ACUTE PT VISIT: 1 Visit                     [name] PT Acute Rehabilitation Services Pager [phone] Office [phone]    [name] 03/06/2023, 12:40 PM

## 2023-03-06 NOTE — TOC Transition Note (Signed)
 Transition of Care [facility]) - Discharge Note   Patient Details  Name: Jesse Mann MRN: [id number] Date of Birth: Jul 13, 1940  Transition of Care [facility]) CM/SW Contact:  Howell Rucks, RN Phone Number: 03/06/2023, 11:04 AM   Clinical Narrative:  Met with pt at bedside to review dc therapy and DME needs, pt confirmed OPPT, no home DME need, reports he has RW.  No TOC needs.      Final next level of care: OP Rehab Barriers to Discharge: No Barriers Identified   Patient Goals and CMS Choice Patient states their goals for this hospitalization and ongoing recovery are:: return home          Discharge Placement                       Discharge Plan and Services Additional resources added to the After Visit Summary for                                       Social Drivers of Health (SDOH) Interventions SDOH Screenings   Food Insecurity: No Food Insecurity (03/05/2023)  Housing: Low Risk  (03/05/2023)  Transportation Needs: No Transportation Needs (03/05/2023)  Utilities: Not At Risk (03/05/2023)  Social Connections: Unknown (03/05/2023)  Tobacco Use: Medium Risk (03/05/2023)     Readmission Risk Interventions     No data to display

## 2023-03-06 NOTE — Progress Notes (Signed)
     Subjective: Patient reports pain as mild.  Spinal had not completely worn off yesterday when working with PT so was not able to take steps yet, but feels stronger today and motivated to go home.  Objective:   VITALS:   Vitals:   03/05/23 1656 03/05/23 2140 03/06/23 0112 03/06/23 0527  BP: (!) 153/80 103/72 115/62 (!) 140/70  Pulse: 63 62 (!) 49 60  Resp: 18 18 18 18   Temp: 97.6 F (36.4 C) 98.6 F (37 C) 97.7 F (36.5 C) 98.6 F (37 C)  TempSrc:      SpO2: 100% 97% 100% 97%  Weight:      Height:        Neurovascular intact Sensation intact distally Intact pulses distally Dorsiflexion/Plantar flexion intact Compartment soft   Lab Results  Component Value Date   WBC 9.5 03/06/2023   HGB 11.6 (L) 03/06/2023   HCT 36.6 (L) 03/06/2023   MCV 96.1 03/06/2023   PLT 166 03/06/2023   BMET    Component Value Date/Time   NA 132 (L) 03/06/2023 0410   NA 140 11/27/2016 1107   K 4.2 03/06/2023 0410   K 4.4 11/27/2016 1107   CL 102 03/06/2023 0410   CO2 21 (L) 03/06/2023 0410   CO2 27 11/27/2016 1107   GLUCOSE 152 (H) 03/06/2023 0410   GLUCOSE 87 11/27/2016 1107   BUN 29 (H) 03/06/2023 0410   BUN 18.9 11/27/2016 1107   CREATININE 1.37 (H) 03/06/2023 0410   CREATININE 1.44 (H) 02/25/2023 0928   CREATININE 1.2 11/27/2016 1107   CALCIUM 8.1 (L) 03/06/2023 0410   CALCIUM 9.0 11/27/2016 1107   EGFR >60 11/27/2016 1107   GFRNONAA 52 (L) 03/06/2023 0410   GFRNONAA 49 (L) 02/25/2023 0928      Xray: TKA compnents in good position, no adverse features  Assessment/Plan: 1 Day Post-Op   Principal Problem:   Primary osteoarthritis of left knee  S/p L TKA 03/05/23  Post op recs: WB: WBAT Abx: ancef Imaging: PACU xrays DVT prophylaxis:  Eliquis 2.5 mg twice daily x 4 weeks given cardiac history  Follow up: 2 weeks after surgery for a wound check with Dr. Blanchie Dessert at [facility].  Address: [address], [place], [place] [address]  Office  Phone: [phone]  Joen Laura 03/06/2023, 7:12 AM   Weber Cooks, MD  Contact information:   [phone] 7am-5pm epic message Dr. Blanchie Dessert, or call office for patient follow up: [phone] After hours and holidays please check [email or web address] for group call information for [facility]

## 2023-03-06 NOTE — Progress Notes (Signed)
Discharge package printed and instructions given to pt. Pt verbalizes understanding. 

## 2023-03-06 NOTE — Care Management Obs Status (Signed)
 MEDICARE OBSERVATION STATUS NOTIFICATION   Patient Details  Name: Jesse Mann MRN: [id number] Date of Birth: 1940-07-10   Medicare Observation Status Notification Given:  Yes    Howell Rucks, RN 03/06/2023, 9:44 AM

## 2023-03-07 DIAGNOSIS — R2689 Other abnormalities of gait and mobility: Secondary | ICD-10-CM | POA: Diagnosis not present

## 2023-03-07 DIAGNOSIS — M25562 Pain in left knee: Secondary | ICD-10-CM | POA: Diagnosis not present

## 2023-03-07 DIAGNOSIS — M25662 Stiffness of left knee, not elsewhere classified: Secondary | ICD-10-CM | POA: Diagnosis not present

## 2023-03-07 DIAGNOSIS — Z96652 Presence of left artificial knee joint: Secondary | ICD-10-CM | POA: Diagnosis not present

## 2023-03-11 DIAGNOSIS — Z96652 Presence of left artificial knee joint: Secondary | ICD-10-CM | POA: Diagnosis not present

## 2023-03-11 DIAGNOSIS — M25662 Stiffness of left knee, not elsewhere classified: Secondary | ICD-10-CM | POA: Diagnosis not present

## 2023-03-11 DIAGNOSIS — R2689 Other abnormalities of gait and mobility: Secondary | ICD-10-CM | POA: Diagnosis not present

## 2023-03-11 DIAGNOSIS — M25562 Pain in left knee: Secondary | ICD-10-CM | POA: Diagnosis not present

## 2023-03-13 DIAGNOSIS — Z96652 Presence of left artificial knee joint: Secondary | ICD-10-CM | POA: Diagnosis not present

## 2023-03-13 DIAGNOSIS — R2689 Other abnormalities of gait and mobility: Secondary | ICD-10-CM | POA: Diagnosis not present

## 2023-03-13 DIAGNOSIS — M25662 Stiffness of left knee, not elsewhere classified: Secondary | ICD-10-CM | POA: Diagnosis not present

## 2023-03-13 DIAGNOSIS — M25562 Pain in left knee: Secondary | ICD-10-CM | POA: Diagnosis not present

## 2023-03-17 DIAGNOSIS — R2689 Other abnormalities of gait and mobility: Secondary | ICD-10-CM | POA: Diagnosis not present

## 2023-03-17 DIAGNOSIS — Z96652 Presence of left artificial knee joint: Secondary | ICD-10-CM | POA: Diagnosis not present

## 2023-03-17 DIAGNOSIS — M25662 Stiffness of left knee, not elsewhere classified: Secondary | ICD-10-CM | POA: Diagnosis not present

## 2023-03-17 DIAGNOSIS — M25562 Pain in left knee: Secondary | ICD-10-CM | POA: Diagnosis not present

## 2023-03-19 DIAGNOSIS — M25662 Stiffness of left knee, not elsewhere classified: Secondary | ICD-10-CM | POA: Diagnosis not present

## 2023-03-19 DIAGNOSIS — Z96652 Presence of left artificial knee joint: Secondary | ICD-10-CM | POA: Diagnosis not present

## 2023-03-19 DIAGNOSIS — M25562 Pain in left knee: Secondary | ICD-10-CM | POA: Diagnosis not present

## 2023-03-19 DIAGNOSIS — R2689 Other abnormalities of gait and mobility: Secondary | ICD-10-CM | POA: Diagnosis not present

## 2023-03-20 DIAGNOSIS — I454 Nonspecific intraventricular block: Secondary | ICD-10-CM | POA: Diagnosis not present

## 2023-03-20 DIAGNOSIS — I469 Cardiac arrest, cause unspecified: Secondary | ICD-10-CM | POA: Diagnosis not present

## 2023-03-20 DIAGNOSIS — I499 Cardiac arrhythmia, unspecified: Secondary | ICD-10-CM | POA: Diagnosis not present

## 2023-03-20 DIAGNOSIS — I420 Dilated cardiomyopathy: Secondary | ICD-10-CM | POA: Diagnosis not present

## 2023-03-20 DIAGNOSIS — I4901 Ventricular fibrillation: Secondary | ICD-10-CM | POA: Diagnosis not present

## 2023-03-20 DIAGNOSIS — I429 Cardiomyopathy, unspecified: Secondary | ICD-10-CM | POA: Diagnosis not present

## 2023-03-20 DIAGNOSIS — I2510 Atherosclerotic heart disease of native coronary artery without angina pectoris: Secondary | ICD-10-CM | POA: Diagnosis not present

## 2023-03-24 DIAGNOSIS — I25119 Atherosclerotic heart disease of native coronary artery with unspecified angina pectoris: Secondary | ICD-10-CM | POA: Diagnosis not present

## 2023-03-24 DIAGNOSIS — N1831 Chronic kidney disease, stage 3a: Secondary | ICD-10-CM | POA: Diagnosis not present

## 2023-03-24 DIAGNOSIS — I5022 Chronic systolic (congestive) heart failure: Secondary | ICD-10-CM | POA: Diagnosis not present

## 2023-03-24 DIAGNOSIS — D6489 Other specified anemias: Secondary | ICD-10-CM | POA: Diagnosis not present

## 2023-03-24 DIAGNOSIS — R2689 Other abnormalities of gait and mobility: Secondary | ICD-10-CM | POA: Diagnosis not present

## 2023-03-24 DIAGNOSIS — Z95810 Presence of automatic (implantable) cardiac defibrillator: Secondary | ICD-10-CM | POA: Diagnosis not present

## 2023-03-24 DIAGNOSIS — M25562 Pain in left knee: Secondary | ICD-10-CM | POA: Diagnosis not present

## 2023-03-24 DIAGNOSIS — I255 Ischemic cardiomyopathy: Secondary | ICD-10-CM | POA: Diagnosis not present

## 2023-03-24 DIAGNOSIS — M25662 Stiffness of left knee, not elsewhere classified: Secondary | ICD-10-CM | POA: Diagnosis not present

## 2023-03-24 DIAGNOSIS — Z96652 Presence of left artificial knee joint: Secondary | ICD-10-CM | POA: Diagnosis not present

## 2023-03-24 DIAGNOSIS — C61 Malignant neoplasm of prostate: Secondary | ICD-10-CM | POA: Diagnosis not present

## 2023-03-24 DIAGNOSIS — I420 Dilated cardiomyopathy: Secondary | ICD-10-CM | POA: Diagnosis not present

## 2023-03-24 DIAGNOSIS — I110 Hypertensive heart disease with heart failure: Secondary | ICD-10-CM | POA: Diagnosis not present

## 2023-03-25 DIAGNOSIS — M1712 Unilateral primary osteoarthritis, left knee: Secondary | ICD-10-CM | POA: Diagnosis not present

## 2023-03-26 DIAGNOSIS — R2689 Other abnormalities of gait and mobility: Secondary | ICD-10-CM | POA: Diagnosis not present

## 2023-03-26 DIAGNOSIS — Z96652 Presence of left artificial knee joint: Secondary | ICD-10-CM | POA: Diagnosis not present

## 2023-03-26 DIAGNOSIS — M25662 Stiffness of left knee, not elsewhere classified: Secondary | ICD-10-CM | POA: Diagnosis not present

## 2023-03-26 DIAGNOSIS — M25562 Pain in left knee: Secondary | ICD-10-CM | POA: Diagnosis not present

## 2023-03-31 ENCOUNTER — Encounter (HOSPITAL_BASED_OUTPATIENT_CLINIC_OR_DEPARTMENT_OTHER): Payer: Self-pay

## 2023-03-31 ENCOUNTER — Ambulatory Visit (INDEPENDENT_AMBULATORY_CARE_PROVIDER_SITE_OTHER)
Admit: 2023-03-31 | Discharge: 2023-03-31 | Disposition: A | Discharge status: Home / Self Care | Attending: Family Medicine | Admitting: Family Medicine

## 2023-03-31 ENCOUNTER — Ambulatory Visit (HOSPITAL_BASED_OUTPATIENT_CLINIC_OR_DEPARTMENT_OTHER)
Admission: EM | Admit: 2023-03-31 | Discharge: 2023-03-31 | Disposition: A | Discharge status: Home / Self Care | Attending: Family Medicine | Admitting: Family Medicine

## 2023-03-31 DIAGNOSIS — S0990XA Unspecified injury of head, initial encounter: Secondary | ICD-10-CM

## 2023-03-31 DIAGNOSIS — W1830XA Fall on same level, unspecified, initial encounter: Secondary | ICD-10-CM | POA: Diagnosis not present

## 2023-03-31 DIAGNOSIS — R55 Syncope and collapse: Secondary | ICD-10-CM | POA: Diagnosis not present

## 2023-03-31 DIAGNOSIS — E785 Hyperlipidemia, unspecified: Secondary | ICD-10-CM | POA: Diagnosis not present

## 2023-03-31 DIAGNOSIS — Z96652 Presence of left artificial knee joint: Secondary | ICD-10-CM | POA: Diagnosis not present

## 2023-03-31 DIAGNOSIS — I6523 Occlusion and stenosis of bilateral carotid arteries: Secondary | ICD-10-CM | POA: Diagnosis not present

## 2023-03-31 DIAGNOSIS — D6489 Other specified anemias: Secondary | ICD-10-CM | POA: Diagnosis not present

## 2023-03-31 DIAGNOSIS — M25562 Pain in left knee: Secondary | ICD-10-CM | POA: Diagnosis not present

## 2023-03-31 DIAGNOSIS — M25662 Stiffness of left knee, not elsewhere classified: Secondary | ICD-10-CM | POA: Diagnosis not present

## 2023-03-31 DIAGNOSIS — D519 Vitamin B12 deficiency anemia, unspecified: Secondary | ICD-10-CM | POA: Diagnosis not present

## 2023-03-31 DIAGNOSIS — R2689 Other abnormalities of gait and mobility: Secondary | ICD-10-CM | POA: Diagnosis not present

## 2023-03-31 NOTE — ED Notes (Signed)
 Approval for CT of head granted by [facility] [employer]. Tracking Number for PA #[id number]. Prior Authorization Number will take 3-5 days to generate. Notified CT tech that approval to proceed with test granted.

## 2023-03-31 NOTE — Discharge Instructions (Signed)
 I am still waiting the results of your CAT scan.  Hopefully will get those soon I will give you a call and I get the results We have put 3 stitches in the head.  Keep the area clean and dry and you can put antibiotic ointment on the area for the next couple of days. You can return here or to your doctor's office for removal of the sutures within 7- 10 days.  If you start developing any worsening symptoms to include dizziness, headache, blurry vision, nausea or vomiting he will need to go to the ER.

## 2023-03-31 NOTE — ED Notes (Addendum)
 Contacted [employer] to obtain PA for Head CT W/O Contrast. Advised that PA is to be obtained by [facility].  Call transferred.

## 2023-03-31 NOTE — ED Triage Notes (Signed)
 Syncopal episode which lead to head laceration. Patient states feeling nauseous and light headed.

## 2023-03-31 NOTE — ED Notes (Signed)
 Waiting on hold to speak to representative with [facility] [employer].

## 2023-04-01 NOTE — ED Provider Notes (Signed)
 [facility]    CSN: [id number] Arrival date & time: 03/31/23  1617      History   Chief Complaint Chief Complaint  Patient presents with   Head Injury    HPI Jesse Mann is a 83 y.o. male.   Patient is a 83 year old male presents today for follow-up.  Fall occurred earlier today.  Reports that he was on the couch taking a nap and heard his doorbell ring and got up really quickly to open the door then proceeded to have a syncopal episode.  Fell at the front porch witnessed by [employer].  Has laceration to left forehead.  Bleeding controlled.  Patient is on Eliquis.  Significant cardiac history.  Did not have any chest pain prior to syncopal episode.  Did not feel his defibrillator go off.  Reports currently just feels weak but he has felt weak for some time and has been worked up by his primary care doctor for this.  Denies any headache, dizziness, blurry vision, nausea, vomiting, chest pain or shortness of breath   Head Injury   Past Medical History:  Diagnosis Date   AICD (automatic cardioverter/defibrillator) present    Anemia    Arthritis    fingers- right (2-4 fingers with ROM issues).knee left   Bifascicular block    [facility]iac [facility] (HCC[facility]   Cardiac arrhythmia    Dr. Revankar,cardiology [phone] [facility])   Cardiac murmur    Cardiomyopathy [facility])    Ischemic   CHF (congestive heart failure) (HCC)    Chronic kidney disease    CKD3   Coronary artery disease    Flail chest    GERD (gastroesophageal reflux disease)    Hemorrhoids    History of tracheostomy    History of ventricular fibrillation 07/22/2016   Hypercholesteremia    Hypertension    Left anterior fascicular block (LAFB)    Myocardial infarction (HCC)    7'14[facility]- "feeling strange" eavaluated, heart cath done with stents x2 placed   Pneumonia    history of   Presence of permanent  cardiac pacemaker    Prostate cancer (HCC)    PVC (premature ventricular contraction)    RBBB    Recurrent dry cough    intermittent daily -occ. 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   CARDIAC CATHETERIZATION     COLONOSCOPY  2011   CORONARY ANGIOPLASTY     CYSTOSCOPY W/ URETERAL STENT PLACEMENT Bilateral 04/16/2017   Procedure: CYSTOSCOPY WITH CATHETER  REPLACEMENT FIREFLY;  Surgeon: Crist Fat, MD;  Location: [facility];  Service: Urology;  Laterality: Bilateral;   EYE SURGERY Bilateral    cataract extraction   HAND  SURGERY     HERNIA REPAIR  1997 and 1995   inguinal (x2 on one side)   ICD IMPLANT     INGUINAL HERNIA REPAIR     INSERT / REPLACE / REMOVE PACEMAKER     LYMPH NODE DISSECTION Bilateral 03/03/2015   Procedure: BILATERAL PELVIC LYMPH NODE DISSECTION;  Surgeon: Crist Fat, MD;  Location: [facility];  Service: Urology;  Laterality: Bilateral;   PROSTATE BIOPSY     REPAIR EXTENSOR TENDON HAND Right    (3-4 fingers) '65- x3-4 times"trauma injury   ROBOT ASSISTED LAPAROSCOPIC RADICAL PROSTATECTOMY Bilateral 03/03/2015   Procedure: XI ROBOTIC ASSISTED LAPAROSCOPIC RADICAL PROSTATECTOMY;  Surgeon: Crist Fat, MD;  Location: [facility];  Service: Urology;  Laterality: Bilateral;    Surgery to correct flail chest after CPR     TONSILLECTOMY     TOTAL KNEE ARTHROPLASTY Right 12/11/2022   Procedure: TOTAL KNEE ARTHROPLASTY;  Surgeon: Joen Laura, MD;  Location: [facility];  Service: Orthopedics;  Laterality: Right;   TOTAL KNEE ARTHROPLASTY Left 03/05/2023   Procedure: TOTAL KNEE ARTHROPLASTY;  Surgeon: Joen Laura, MD;  Location: [facility];  Service: Orthopedics;  Laterality: Left;   TRACHEOSTOMY         Home Medications    Prior to Admission medications   Medication Sig Start Date End Date Taking? Authorizing Provider  acetaminophen (TYLENOL) 500 MG tablet Take 2 tablets (1,000 mg total) by mouth every 8 (eight) hours as needed. 03/05/23 04/04/23  Cecil Cobbs, PA-C  amiodarone (PACERONE) 200 MG tablet Take 200 mg by mouth daily.    [provider]  apixaban (ELIQUIS) 2.5 MG TABS tablet Take 1 tablet (2.5 mg total) by mouth 2 (two) times daily. 03/05/23   Cecil Cobbs, PA-C  carvedilol (COREG) 12.5 MG tablet Take 12.5 mg by mouth 2 (two) times daily with a meal.    [provider]  chlorhexidine (HIBICLENS) 4 % external liquid Apply 15 mLs (1 Application total) topically as directed for 30 doses. Use as directed daily for 5 days every other week for 6 weeks. 03/05/23   Cecil Cobbs, PA-C  cyanocobalamin (VITAMIN B12) 1000 MCG tablet Take 1,000 mcg by mouth daily.    [provider]  famotidine (PEPCID) 40 MG tablet Take 40 mg at bedtime by mouth.    [provider]  FARXIGA 10 MG TABS tablet Take 10 mg by mouth daily. 09/14/22   [provider]  magnesium oxide (MAG-OX) 400 (240 Mg) MG tablet Take 400 mg by mouth daily. 03/08/22   [provider]  mupirocin ointment (BACTROBAN) 2 % Place 1 Application into the nose 2 (two) times daily for 60 doses. Use as directed 2 times daily for 5 days every other week for 6 weeks. 03/05/23 04/04/23  Cecil Cobbs, PA-C  nitroGLYCERIN (NITROSTAT) 0.4  MG SL tablet Place 0.4 mg under the tongue every 5 (five) minutes as needed for chest pain.  09/28/14   [provider]  omeprazole (PRILOSEC) 40 MG capsule Take 1 capsule (40 mg total) by mouth daily for 21 days. 03/05/23 03/26/23  Cecil Cobbs, PA-C  polyethylene glycol (MIRALAX) 17 g packet Take 17 g by mouth daily. 03/05/23   Cecil Cobbs, PA-C  rosuvastatin (CRESTOR) 10 MG tablet Take 10 mg by mouth daily. 08/04/18   [provider]    Family History Family History  Problem Relation Age of Onset   Heart failure Mother  Cancer Father        colon   Cancer Paternal Grandmother        type unknown    Social History Social History   Tobacco Use   Smoking status: Former    Current packs/day: 0.00    Average packs/day: 1 pack/day for 10.0 years (10.0 ttl pk-yrs)    Types: Cigarettes    Start date: 01/08/1956    Quit date: 01/07/1966    Years since quitting: 57.2   Smokeless tobacco: Never  Vaping Use   Vaping status: Never Used  Substance Use Topics   Alcohol use: No    Alcohol/week: 0.0 standard drinks of alcohol   Drug use: No     Allergies   Patient has no known allergies.   Review of Systems Review of Systems See HPI  Physical Exam Triage Vital Signs ED Triage Vitals [03/31/23 1631]  Encounter Vitals Group     BP 109/67     Systolic BP Percentile      Diastolic BP Percentile      Pulse Rate 67     Resp 18     Temp 98.6 F (37 C)     Temp Source Oral     SpO2 96 %     Weight      Height      Head Circumference      Peak Flow      Pain Score      Pain Loc      Pain Education      Exclude from Growth Chart    No data found.  Updated Vital Signs BP 109/67 (BP Location: Left Arm)   Pulse 67   Temp 98.6 F (37 C) (Oral)   Resp 18   SpO2 96%   Visual Acuity Right Eye Distance:   Left Eye Distance:   Bilateral Distance:    Right Eye Near:   Left Eye Near:    Bilateral Near:     Physical Exam HENT:     Head:       Comments: Approximated 1 inch laceration to left forehead.  Bleeding controlled. Eyes:     General: No visual field deficit.    Extraocular Movements: Extraocular movements intact.     Right eye: Normal extraocular motion and no nystagmus.     Left eye: Normal extraocular motion and no nystagmus.  Cardiovascular:     Rate and Rhythm: Regular rhythm.     Pulses: Normal pulses.     Heart sounds: Normal heart sounds.  Pulmonary:     Effort: Pulmonary effort is normal.     Breath sounds: Normal breath sounds.  Skin:    General: Skin is warm and dry.  Neurological:     General: No focal deficit present.     Mental Status: He is alert and oriented to person, place, and time.     GCS: GCS eye subscore is 4. GCS verbal subscore is 5. GCS motor subscore is 6.     Cranial Nerves: No cranial nerve deficit, dysarthria or facial asymmetry.     Sensory: Sensation is intact.     Motor: No weakness, tremor, atrophy, abnormal muscle tone, seizure activity or pronator drift.     Coordination: Coordination is intact.     Gait: Gait is intact.      UC Treatments / Results  Labs (all labs ordered are listed, but only abnormal results are displayed) Labs Reviewed - No data to display  EKG  Radiology CT Head Wo Contrast Result Date: 03/31/2023 CLINICAL DATA:  Head trauma, moderate-severe syncope and head injury Pt passed out today, laceration over lt eye Hx of prostate cancer with sx No hx of cva EXAM: CT HEAD WITHOUT CONTRAST TECHNIQUE: Contiguous axial images were obtained from the base of the skull through the vertex without intravenous contrast. RADIATION DOSE REDUCTION: This exam was performed according to the departmental dose-optimization program which includes automated exposure control, adjustment of the mA and/or kV according to patient size and/or use of iterative reconstruction technique. COMPARISON:  CT head 07/22/2016 FINDINGS: Brain: Patchy and confluent areas of decreased  attenuation are noted throughout the deep and periventricular white matter of the cerebral hemispheres bilaterally, compatible with chronic microvascular ischemic disease. No evidence of large-territorial acute infarction. No parenchymal hemorrhage. No mass lesion. No extra-axial collection. No mass effect or midline shift. No hydrocephalus. Basilar cisterns are patent. Septum pellucidum variant. Vascular: No hyperdense vessel. Atherosclerotic calcifications are present within the cavernous internal carotid arteries. Skull: No acute fracture or focal lesion. Sinuses/Orbits: Paranasal sinuses and mastoid air cells are clear. The orbits are unremarkable. Other: None. IMPRESSION: No acute intracranial abnormality. Electronically Signed   By: Tish Frederickson M.D.   On: 03/31/2023 19:04    Procedures Laceration Repair  Date/Time: 04/01/2023 9:53 AM  Performed by: Janace Aris, FNP Authorized by: Janace Aris, FNP   Consent:    Consent obtained:  Verbal   Consent given by:  Patient   Risks, benefits, and alternatives were discussed: yes     Risks discussed:  Infection and pain Universal protocol:    Patient identity confirmed:  Verbally with patient Anesthesia:    Anesthesia method:  Local infiltration   Local anesthetic:  Lidocaine 1% w/o epi Laceration details:    Location:  Face   Face location:  Forehead Exploration:    Limited defect created (wound extended): yes     Hemostasis achieved with:  Direct pressure   Imaging obtained comment:  CT head   Imaging outcome: foreign body not noted     Contaminated: no   Treatment:    Area cleansed with:  Saline and Shur-Clens   Amount of cleaning:  Standard   Visualized foreign bodies/material removed: no     Debridement:  None   Undermining:  None   Scar revision: no     Layers/structures repaired:  Deep subcutaneous Skin repair:    Repair method:  Sutures   Suture size:  4-0   Suture material:  Prolene   Suture technique:  Simple  interrupted   Number of sutures:  3 Approximation:    Approximation:  Close Repair type:    Repair type:  Simple Post-procedure details:    Dressing:  Antibiotic ointment and non-adherent dressing   Procedure completion:  Tolerated well, no immediate complications  (including critical care time)  Medications Ordered in UC Medications - No data to display  Initial Impression / Assessment and Plan / UC Course  I have reviewed the triage vital signs and the nursing notes.  Pertinent labs & imaging results that were available during my care of the patient were reviewed by me and considered in my medical decision making (see chart for details).     Head injury with laceration-neuroexam normal.  No concerns on exam CT scan without any acute intracranial abnormality. Laceration closed with 3 sutures to left forehead.  Bleeding controlled. Patient tolerated well. Recommend keep area clean and use antibiotic limit for the next  few days to prevent infection. Return in 7 to 10 days for suture removal For any worsening issues to include headache, dizziness, blurred vision, nausea or vomiting will need to go to the ER. Final Clinical Impressions(s) / UC Diagnoses   Final diagnoses:  Injury of head, initial encounter     Discharge Instructions      I am still waiting the results of your CAT scan.  Hopefully will get those soon I will give you a call and I get the results We have put 3 stitches in the head.  Keep the area clean and dry and you can put antibiotic ointment on the area for the next couple of days. You can return here or to your doctor's office for removal of the sutures within 7- 10 days.  If you start developing any worsening symptoms to include dizziness, headache, blurry vision, nausea or vomiting he will need to go to the ER.    ED Prescriptions   None    PDMP not reviewed this encounter.   Janace Aris, FNP 04/01/23 [phone]

## 2023-04-02 DIAGNOSIS — M25662 Stiffness of left knee, not elsewhere classified: Secondary | ICD-10-CM | POA: Diagnosis not present

## 2023-04-02 DIAGNOSIS — M25562 Pain in left knee: Secondary | ICD-10-CM | POA: Diagnosis not present

## 2023-04-02 DIAGNOSIS — R2689 Other abnormalities of gait and mobility: Secondary | ICD-10-CM | POA: Diagnosis not present

## 2023-04-02 DIAGNOSIS — Z96652 Presence of left artificial knee joint: Secondary | ICD-10-CM | POA: Diagnosis not present

## 2023-04-04 ENCOUNTER — Inpatient Hospital Stay: Attending: Oncology

## 2023-04-04 ENCOUNTER — Other Ambulatory Visit: Payer: Self-pay

## 2023-04-04 ENCOUNTER — Telehealth: Payer: Self-pay

## 2023-04-04 DIAGNOSIS — D509 Iron deficiency anemia, unspecified: Secondary | ICD-10-CM | POA: Insufficient documentation

## 2023-04-04 DIAGNOSIS — R300 Dysuria: Secondary | ICD-10-CM

## 2023-04-04 DIAGNOSIS — C61 Malignant neoplasm of prostate: Secondary | ICD-10-CM | POA: Insufficient documentation

## 2023-04-04 DIAGNOSIS — R5383 Other fatigue: Secondary | ICD-10-CM

## 2023-04-04 LAB — CMP (CANCER CENTER ONLY)
ALT: <5 U/L (ref 0–44)
AST: 16 U/L (ref 15–41)
Albumin: 4.1 g/dL (ref 3.5–5.0)
Alkaline Phosphatase: 70 U/L (ref 38–126)
Anion gap: 11 (ref 5–15)
BUN: 17 mg/dL (ref 8–23)
CO2: 26 mmol/L (ref 22–32)
Calcium: 9.2 mg/dL (ref 8.9–10.3)
Chloride: 108 mmol/L (ref 98–111)
Creatinine: 1.3 mg/dL — ABNORMAL HIGH (ref 0.61–1.24)
GFR, Estimated: 56 mL/min — ABNORMAL LOW (ref >60)
Glucose, Bld: 76 mg/dL (ref 70–99)
Potassium: 4 mmol/L (ref 3.5–5.1)
Sodium: 145 mmol/L (ref 135–145)
Total Bilirubin: 0.4 mg/dL (ref 0.0–1.2)
Total Protein: 6.1 g/dL — ABNORMAL LOW (ref 6.5–8.1)

## 2023-04-04 LAB — URINALYSIS, COMPLETE (UACMP) WITH MICROSCOPIC
Bilirubin Urine: NEGATIVE
Glucose, UA: >=500 mg/dL — AB
Hgb urine dipstick: NEGATIVE
Ketones, ur: NEGATIVE mg/dL
Leukocytes,Ua: NEGATIVE
Nitrite: NEGATIVE
Protein, ur: NEGATIVE mg/dL
RBC / HPF: NONE SEEN RBC/hpf (ref 0–5)
Specific Gravity, Urine: 1.015 (ref 1.005–1.030)
pH: 5 (ref 5.0–8.0)

## 2023-04-04 LAB — CBC WITH DIFFERENTIAL (CANCER CENTER ONLY)
Abs Immature Granulocytes: 0.02 10*3/uL (ref 0.00–0.07)
Basophils Absolute: 0 10*3/uL (ref 0.0–0.1)
Basophils Relative: 1 %
Eosinophils Absolute: 0.3 10*3/uL (ref 0.0–0.5)
Eosinophils Relative: 6 %
HCT: 39.3 % (ref 39.0–52.0)
Hemoglobin: 12.5 g/dL — ABNORMAL LOW (ref 13.0–17.0)
Immature Granulocytes: 0 %
Lymphocytes Relative: 22 %
Lymphs Abs: 1.1 10*3/uL (ref 0.7–4.0)
MCH: 31 pg (ref 26.0–34.0)
MCHC: 31.8 g/dL (ref 30.0–36.0)
MCV: 97.5 fL (ref 80.0–100.0)
Monocytes Absolute: 0.7 10*3/uL (ref 0.1–1.0)
Monocytes Relative: 13 %
Neutro Abs: 2.9 10*3/uL (ref 1.7–7.7)
Neutrophils Relative %: 58 %
Platelet Count: 258 10*3/uL (ref 150–400)
RBC: 4.03 MIL/uL — ABNORMAL LOW (ref 4.22–5.81)
RDW: 14.8 % (ref 11.5–15.5)
WBC Count: 5.1 10*3/uL (ref 4.0–10.5)
nRBC: 0 % (ref 0.0–0.2)
nRBC: 0 /100{WBCs}

## 2023-04-04 NOTE — Telephone Encounter (Signed)
 Marcelino Duster, pt's daughter called and is concerned that her dad is not feeling well. "He doesn't normally like to go to Dr's, but he told me he needed to go. He doesn't have any energy. He usually does everything around the house, but he just can't do it right now. He isn't eating well. He is lethargic. He fell the other night (getting up to fast from the couch to answer the door bell) and had to go and get stitches. His PCP did labs, he saw the cardiologist and they did labs. The cardiologist told her to call the cancer doctor".  I reviewed the chart. After fall, he was seen at urgent care, given CT head (which was negative) & stitches. His iron panel here in Feb 2025 was normal, PSA 0.14, no reports of fatigue, appetite issues, light headiness/dizziness mentioned to Dr Melvyn Neth, Next f/u for Dr. Melvyn Neth in June. Pt has had drop in Hgb from 14.6 (on 02/25/23) to 12.5 (03/08/23). Weight loss of 8 pounds from 02/25/23 (153.1#) to 03/20/2023 (146#). No fevers, cough, dysuria, & blood in stool per pt's daughter. She is unsure of last colonoscopy, but thinks he was told he wouldn't need another one @ his age. Last iron infusion here @ cancer center was in 10/2022. I will report all above to Dr Melvyn Neth for recommendations.             the cardiologist NP, S. Tann, mentioned in her notes to notify oncology, as he doesn't have cardiac issues.

## 2023-04-05 LAB — PROSTATE-SPECIFIC AG, SERUM (LABCORP): Prostate Specific Ag, Serum: 0.2 ng/mL (ref 0.0–4.0)

## 2023-04-07 DIAGNOSIS — M25562 Pain in left knee: Secondary | ICD-10-CM | POA: Diagnosis not present

## 2023-04-07 DIAGNOSIS — Z96652 Presence of left artificial knee joint: Secondary | ICD-10-CM | POA: Diagnosis not present

## 2023-04-07 DIAGNOSIS — M25662 Stiffness of left knee, not elsewhere classified: Secondary | ICD-10-CM | POA: Diagnosis not present

## 2023-04-07 DIAGNOSIS — R2689 Other abnormalities of gait and mobility: Secondary | ICD-10-CM | POA: Diagnosis not present

## 2023-04-07 LAB — URINE CULTURE

## 2023-04-08 ENCOUNTER — Telehealth: Payer: Self-pay | Admitting: Dietician

## 2023-04-08 NOTE — Telephone Encounter (Signed)
 Patient referred by RN for weight loss. First attempt to reach. Provided my cell# on voice mail and in text to return call to set up a nutrition consult.  Gennaro Africa, RDN, LDN Registered Dietitian, [facility] Part Time Remote (Usual office hours: Tuesday-Thursday) Cell: [phone]

## 2023-04-09 ENCOUNTER — Ambulatory Visit (HOSPITAL_BASED_OUTPATIENT_CLINIC_OR_DEPARTMENT_OTHER): Admission: EM | Admit: 2023-04-09 | Discharge: 2023-04-09 | Disposition: A | Discharge status: Home / Self Care

## 2023-04-09 DIAGNOSIS — Z4802 Encounter for removal of sutures: Secondary | ICD-10-CM

## 2023-04-09 NOTE — ED Triage Notes (Signed)
 Well healed wound to left side of forehead. No drainage. No redness.

## 2023-04-10 ENCOUNTER — Telehealth: Payer: Self-pay | Admitting: Dietician

## 2023-04-10 ENCOUNTER — Other Ambulatory Visit: Payer: Self-pay

## 2023-04-10 DIAGNOSIS — Z96652 Presence of left artificial knee joint: Secondary | ICD-10-CM | POA: Diagnosis not present

## 2023-04-10 DIAGNOSIS — R2689 Other abnormalities of gait and mobility: Secondary | ICD-10-CM | POA: Diagnosis not present

## 2023-04-10 DIAGNOSIS — M25562 Pain in left knee: Secondary | ICD-10-CM | POA: Diagnosis not present

## 2023-04-10 DIAGNOSIS — M25662 Stiffness of left knee, not elsewhere classified: Secondary | ICD-10-CM | POA: Diagnosis not present

## 2023-04-10 NOTE — Telephone Encounter (Signed)
 Patient referred by RN for weight loss. Second attempt to reach.   Patient picked up the phone and said "I'm sorry I'm not at all interested.  I've had about as much as I can take right now."  I did let him know the nutrition services are available to him at no charge and my availability both in person and remotely.  I suggested he reach out to scheduling if he changed his mind and wanted some resources or suggestions.  Gennaro Africa, RDN, LDN Registered Dietitian, [facility] Part Time Remote (Usual office hours: Tuesday-Thursday) Cell: [phone]

## 2023-04-14 DIAGNOSIS — M25662 Stiffness of left knee, not elsewhere classified: Secondary | ICD-10-CM | POA: Diagnosis not present

## 2023-04-14 DIAGNOSIS — Z96652 Presence of left artificial knee joint: Secondary | ICD-10-CM | POA: Diagnosis not present

## 2023-04-14 DIAGNOSIS — M25562 Pain in left knee: Secondary | ICD-10-CM | POA: Diagnosis not present

## 2023-04-14 DIAGNOSIS — R2689 Other abnormalities of gait and mobility: Secondary | ICD-10-CM | POA: Diagnosis not present

## 2023-04-18 DIAGNOSIS — Z6823 Body mass index (BMI) 23.0-23.9, adult: Secondary | ICD-10-CM | POA: Diagnosis not present

## 2023-04-18 DIAGNOSIS — I25119 Atherosclerotic heart disease of native coronary artery with unspecified angina pectoris: Secondary | ICD-10-CM | POA: Diagnosis not present

## 2023-04-18 DIAGNOSIS — C61 Malignant neoplasm of prostate: Secondary | ICD-10-CM | POA: Diagnosis not present

## 2023-04-18 DIAGNOSIS — I110 Hypertensive heart disease with heart failure: Secondary | ICD-10-CM | POA: Diagnosis not present

## 2023-04-18 DIAGNOSIS — N1831 Chronic kidney disease, stage 3a: Secondary | ICD-10-CM | POA: Diagnosis not present

## 2023-04-18 DIAGNOSIS — I255 Ischemic cardiomyopathy: Secondary | ICD-10-CM | POA: Diagnosis not present

## 2023-04-18 DIAGNOSIS — I5022 Chronic systolic (congestive) heart failure: Secondary | ICD-10-CM | POA: Diagnosis not present

## 2023-04-18 DIAGNOSIS — I420 Dilated cardiomyopathy: Secondary | ICD-10-CM | POA: Diagnosis not present

## 2023-04-22 DIAGNOSIS — I420 Dilated cardiomyopathy: Secondary | ICD-10-CM | POA: Diagnosis not present

## 2023-04-22 DIAGNOSIS — Z4502 Encounter for adjustment and management of automatic implantable cardiac defibrillator: Secondary | ICD-10-CM | POA: Diagnosis not present

## 2023-04-23 DIAGNOSIS — Z96652 Presence of left artificial knee joint: Secondary | ICD-10-CM | POA: Diagnosis not present

## 2023-04-23 DIAGNOSIS — R2689 Other abnormalities of gait and mobility: Secondary | ICD-10-CM | POA: Diagnosis not present

## 2023-04-23 DIAGNOSIS — M25662 Stiffness of left knee, not elsewhere classified: Secondary | ICD-10-CM | POA: Diagnosis not present

## 2023-04-23 DIAGNOSIS — M25562 Pain in left knee: Secondary | ICD-10-CM | POA: Diagnosis not present

## 2023-04-29 DIAGNOSIS — M25562 Pain in left knee: Secondary | ICD-10-CM | POA: Diagnosis not present

## 2023-04-29 DIAGNOSIS — R2689 Other abnormalities of gait and mobility: Secondary | ICD-10-CM | POA: Diagnosis not present

## 2023-04-29 DIAGNOSIS — M25662 Stiffness of left knee, not elsewhere classified: Secondary | ICD-10-CM | POA: Diagnosis not present

## 2023-04-29 DIAGNOSIS — Z96652 Presence of left artificial knee joint: Secondary | ICD-10-CM | POA: Diagnosis not present

## 2023-05-05 DIAGNOSIS — Z96652 Presence of left artificial knee joint: Secondary | ICD-10-CM | POA: Diagnosis not present

## 2023-05-05 DIAGNOSIS — M25662 Stiffness of left knee, not elsewhere classified: Secondary | ICD-10-CM | POA: Diagnosis not present

## 2023-05-05 DIAGNOSIS — M25562 Pain in left knee: Secondary | ICD-10-CM | POA: Diagnosis not present

## 2023-05-05 DIAGNOSIS — R2689 Other abnormalities of gait and mobility: Secondary | ICD-10-CM | POA: Diagnosis not present

## 2023-05-13 DIAGNOSIS — M25662 Stiffness of left knee, not elsewhere classified: Secondary | ICD-10-CM | POA: Diagnosis not present

## 2023-05-13 DIAGNOSIS — M25562 Pain in left knee: Secondary | ICD-10-CM | POA: Diagnosis not present

## 2023-05-13 DIAGNOSIS — Z96652 Presence of left artificial knee joint: Secondary | ICD-10-CM | POA: Diagnosis not present

## 2023-05-13 DIAGNOSIS — R2689 Other abnormalities of gait and mobility: Secondary | ICD-10-CM | POA: Diagnosis not present

## 2023-05-20 DIAGNOSIS — M1712 Unilateral primary osteoarthritis, left knee: Secondary | ICD-10-CM | POA: Diagnosis not present

## 2023-05-21 DIAGNOSIS — M25662 Stiffness of left knee, not elsewhere classified: Secondary | ICD-10-CM | POA: Diagnosis not present

## 2023-05-21 DIAGNOSIS — Z96652 Presence of left artificial knee joint: Secondary | ICD-10-CM | POA: Diagnosis not present

## 2023-05-21 DIAGNOSIS — R2689 Other abnormalities of gait and mobility: Secondary | ICD-10-CM | POA: Diagnosis not present

## 2023-05-21 DIAGNOSIS — M25562 Pain in left knee: Secondary | ICD-10-CM | POA: Diagnosis not present

## 2023-05-28 DIAGNOSIS — R2689 Other abnormalities of gait and mobility: Secondary | ICD-10-CM | POA: Diagnosis not present

## 2023-05-28 DIAGNOSIS — M25562 Pain in left knee: Secondary | ICD-10-CM | POA: Diagnosis not present

## 2023-05-28 DIAGNOSIS — M25662 Stiffness of left knee, not elsewhere classified: Secondary | ICD-10-CM | POA: Diagnosis not present

## 2023-05-28 DIAGNOSIS — Z96652 Presence of left artificial knee joint: Secondary | ICD-10-CM | POA: Diagnosis not present

## 2023-06-23 ENCOUNTER — Inpatient Hospital Stay: Attending: Oncology

## 2023-06-23 DIAGNOSIS — I255 Ischemic cardiomyopathy: Secondary | ICD-10-CM | POA: Diagnosis not present

## 2023-06-23 DIAGNOSIS — N189 Chronic kidney disease, unspecified: Secondary | ICD-10-CM | POA: Insufficient documentation

## 2023-06-23 DIAGNOSIS — D649 Anemia, unspecified: Secondary | ICD-10-CM

## 2023-06-23 DIAGNOSIS — D631 Anemia in chronic kidney disease: Secondary | ICD-10-CM | POA: Insufficient documentation

## 2023-06-23 DIAGNOSIS — I25119 Atherosclerotic heart disease of native coronary artery with unspecified angina pectoris: Secondary | ICD-10-CM | POA: Diagnosis not present

## 2023-06-23 DIAGNOSIS — K2960 Other gastritis without bleeding: Secondary | ICD-10-CM | POA: Diagnosis not present

## 2023-06-23 DIAGNOSIS — Z0000 Encounter for general adult medical examination without abnormal findings: Secondary | ICD-10-CM | POA: Diagnosis not present

## 2023-06-23 DIAGNOSIS — N1831 Chronic kidney disease, stage 3a: Secondary | ICD-10-CM | POA: Diagnosis not present

## 2023-06-23 DIAGNOSIS — I5022 Chronic systolic (congestive) heart failure: Secondary | ICD-10-CM | POA: Diagnosis not present

## 2023-06-23 DIAGNOSIS — I420 Dilated cardiomyopathy: Secondary | ICD-10-CM | POA: Diagnosis not present

## 2023-06-23 DIAGNOSIS — I110 Hypertensive heart disease with heart failure: Secondary | ICD-10-CM | POA: Diagnosis not present

## 2023-06-23 DIAGNOSIS — C61 Malignant neoplasm of prostate: Secondary | ICD-10-CM | POA: Diagnosis not present

## 2023-06-23 DIAGNOSIS — E785 Hyperlipidemia, unspecified: Secondary | ICD-10-CM | POA: Diagnosis not present

## 2023-06-23 DIAGNOSIS — R5383 Other fatigue: Secondary | ICD-10-CM

## 2023-06-23 LAB — CMP (CANCER CENTER ONLY)
ALT: 6 U/L (ref 0–44)
AST: 19 U/L (ref 15–41)
Albumin: 4.3 g/dL (ref 3.5–5.0)
Alkaline Phosphatase: 75 U/L (ref 38–126)
Anion gap: 11 (ref 5–15)
BUN: 17 mg/dL (ref 8–23)
CO2: 23 mmol/L (ref 22–32)
Calcium: 9.5 mg/dL (ref 8.9–10.3)
Chloride: 109 mmol/L (ref 98–111)
Creatinine: 1.27 mg/dL — ABNORMAL HIGH (ref 0.61–1.24)
GFR, Estimated: 57 mL/min — ABNORMAL LOW (ref >60)
Glucose, Bld: 94 mg/dL (ref 70–99)
Potassium: 4.6 mmol/L (ref 3.5–5.1)
Sodium: 143 mmol/L (ref 135–145)
Total Bilirubin: 0.6 mg/dL (ref 0.0–1.2)
Total Protein: 6.4 g/dL — ABNORMAL LOW (ref 6.5–8.1)

## 2023-06-23 LAB — CBC WITH DIFFERENTIAL (CANCER CENTER ONLY)
Abs Immature Granulocytes: 0.01 10*3/uL (ref 0.00–0.07)
Basophils Absolute: 0 10*3/uL (ref 0.0–0.1)
Basophils Relative: 1 %
Eosinophils Absolute: 0.4 10*3/uL (ref 0.0–0.5)
Eosinophils Relative: 7 %
HCT: 45.1 % (ref 39.0–52.0)
Hemoglobin: 14.3 g/dL (ref 13.0–17.0)
Immature Granulocytes: 0 %
Lymphocytes Relative: 22 %
Lymphs Abs: 1.4 10*3/uL (ref 0.7–4.0)
MCH: 29.9 pg (ref 26.0–34.0)
MCHC: 31.7 g/dL (ref 30.0–36.0)
MCV: 94.4 fL (ref 80.0–100.0)
Monocytes Absolute: 0.7 10*3/uL (ref 0.1–1.0)
Monocytes Relative: 11 %
Neutro Abs: 3.8 10*3/uL (ref 1.7–7.7)
Neutrophils Relative %: 59 %
Platelet Count: 221 10*3/uL (ref 150–400)
RBC: 4.78 MIL/uL (ref 4.22–5.81)
RDW: 13.2 % (ref 11.5–15.5)
WBC Count: 6.3 10*3/uL (ref 4.0–10.5)
nRBC: 0 % (ref 0.0–0.2)

## 2023-06-23 LAB — IRON AND TIBC
Iron: 74 ug/dL (ref 45–182)
Saturation Ratios: 25 % (ref 17.9–39.5)
TIBC: 297 ug/dL (ref 250–450)
UIBC: 223 ug/dL

## 2023-06-23 LAB — FOLATE: Folate: 25.8 ng/mL (ref >5.9)

## 2023-06-23 LAB — PSA: Prostatic Specific Antigen: 0.2 ng/mL (ref 0.00–4.00)

## 2023-06-23 LAB — VITAMIN B12: Vitamin B-12: 2758 pg/mL — ABNORMAL HIGH (ref 180–914)

## 2023-06-23 LAB — FERRITIN: Ferritin: 111 ng/mL (ref 24–336)

## 2023-06-23 NOTE — Progress Notes (Signed)
 [facility]  [address] [place],  [place]  [address] [phone]  Clinic Day:  06/24/2023  Referring physician: Freya Jesus, *   HISTORY OF PRESENT ILLNESS:  The patient is a 83 y.o. male who I follow for early biochemical recurrence of prostate cancer.  She comes in today for routine follow-up.  Since his last visit, the patient has been doing well.  He continues to not have any bone pain or other systemic symptoms which concerning for his prostate cancer becoming more advanced.   This gentleman also has a history of anemia, with past etiologies being iron  deficiency, B12 deficiency and chronic renal insufficiency.  He last received IV iron  in October 2024.  The patient denies having increased fatigue or any overt forms of blood loss which concern him for having progressive anemia.  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  2 out of 8 pelvic lymph nodes removed with his prostate also contained cancer.  There was also evidence of positive margins seen with this surgery.  Due to biochemical relapse of his prostate cancer, the patient received Lupron  injections from August 2017 to October 2019.  Over the next 5+ years, his PSA was undetectable until a low-level biochemical relapse was recently seen in July 2024.    PHYSICAL EXAM:  Blood pressure 115/77, pulse 75, temperature 98.1 F (36.7 C), temperature source Oral, resp. rate 16, height 5' 6 (1.676 m), weight 147 lb 9.6 oz (67 kg), SpO2 97%. Wt Readings from Last 3 Encounters:  06/24/23 147 lb 9.6 oz (67 kg)  04/04/23 146 lb 3.2 oz (66.3 kg)  03/05/23 153 lb 1.6 oz (69.4 kg)   Body mass index is 23.82 kg/m. Performance status (ECOG): 1 - Symptomatic but completely ambulatory Physical  Exam Constitutional:      Appearance: Normal appearance. He is not ill-appearing.     Comments: He is ambulating with a cane  HENT:     Mouth/Throat:     Mouth: Mucous membranes are moist.     Pharynx: Oropharynx is clear. No oropharyngeal exudate or posterior oropharyngeal erythema.   Cardiovascular:     Rate and Rhythm: Normal rate and regular rhythm.     Heart sounds: No murmur heard.    No friction rub. No gallop.  Pulmonary:     Effort: Pulmonary effort is normal. No respiratory distress.     Breath sounds: Normal breath sounds. No wheezing, rhonchi or rales.  Abdominal:     General: Bowel sounds are normal. There is no distension.     Palpations: Abdomen is soft. There is no mass.     Tenderness: There is no abdominal tenderness.   Musculoskeletal:        General: No swelling.     Right hand: Deformity (arthritic changes) present.     Left hand: Deformity (arthritic changes) present.     Right lower leg: No edema.     Left lower leg: No edema.  Lymphadenopathy:     Cervical: No cervical adenopathy.     Upper Body:     Right upper body: No supraclavicular or axillary adenopathy.     Left upper body: No supraclavicular or axillary adenopathy.     Lower Body: No right inguinal adenopathy. No left inguinal adenopathy.   Skin:  General: Skin is warm.     Coloration: Skin is not jaundiced.     Findings: No lesion or rash.   Neurological:     General: No focal deficit present.     Mental Status: He is alert and oriented to person, place, and time. Mental status is at baseline.   Psychiatric:        Mood and Affect: Mood normal.        Behavior: Behavior normal.        Thought Content: Thought content normal.    LABS:      Latest Ref Rng & Units 06/23/2023   11:29 AM 04/04/2023    1:33 PM 03/06/2023    4:10 AM  CBC  WBC 4.0 - 10.5 K/uL 6.3  5.1  9.5   Hemoglobin 13.0 - 17.0 g/dL 40.9  81.1  91.4   Hematocrit 39.0 - 52.0 % 45.1  39.3  36.6   Platelets 150 - 400  K/uL 221  258  166       Latest Ref Rng & Units 06/23/2023   11:29 AM 04/04/2023    1:33 PM 03/06/2023    4:10 AM  CMP  Glucose 70 - 99 mg/dL 94  76  782   BUN 8 - 23 mg/dL 17  17  29    Creatinine 0.61 - 1.24 mg/dL 9.56  2.13  0.86   Sodium 135 - 145 mmol/L 143  145  132   Potassium 3.5 - 5.1 mmol/L 4.6  4.0  4.2   Chloride 98 - 111 mmol/L 109  108  102   CO2 22 - 32 mmol/L 23  26  21    Calcium  8.9 - 10.3 mg/dL 9.5  9.2  8.1   Total Protein 6.5 - 8.1 g/dL 6.4  6.1    Total Bilirubin 0.0 - 1.2 mg/dL 0.6  0.4    Alkaline Phos 38 - 126 U/L 75  70    AST 15 - 41 U/L 19  16    ALT 0 - 44 U/L 6  <5      Latest Reference Range & Units 06/23/23 11:29 06/23/23 11:30  Iron  45 - 182 ug/dL 74   UIBC ug/dL 578   TIBC 469 - 629 ug/dL 528   Saturation Ratios 17.9 - 39.5 % 25   Ferritin 24 - 336 ng/mL 111   Folate >5.9 ng/mL  25.8  Vitamin B12 180 - 914 pg/mL 2,758 (H)   (H): Data is abnormally high  Latest Reference Range & Units 02/25/23 09:28 04/04/23 13:33 06/23/23 11:29  Prostate Specific Ag, Serum 0.0 - 4.0 ng/mL  0.2   Prostatic Specific Antigen 0.00 - 4.00 ng/mL 0.14  0.20   ASSESSMENT & PLAN:  Assessment/Plan:  A 83 y.o. male with a history of multifactorial anemia and early biochemical recurrence of his Gleason grade 9 prostate cancer.  When evaluating his labs today, his hemoglobin remains excellent.  I am also pleased as his iron , vitamin B12, and folate levels all remain normal.  He knows to continue taking B12 to ensure adequate fortification of his cobalamin stores.  With respect to his prostate cancer, his PSA level remains minimally detectable at 0.2.  For now, his prostate cancer will continue to be followed conservatively.  Overall, this patient appears to be doing well.  I will see him back in 6 months to reassess both his prostate cancer and anemia.  The patient understands all the plans discussed today and is in agreement  with them.    Noelie Renfrow Felicia Horde, MD

## 2023-06-24 ENCOUNTER — Telehealth: Payer: Self-pay | Admitting: Oncology

## 2023-06-24 ENCOUNTER — Other Ambulatory Visit: Payer: Self-pay | Admitting: Oncology

## 2023-06-24 ENCOUNTER — Inpatient Hospital Stay (HOSPITAL_BASED_OUTPATIENT_CLINIC_OR_DEPARTMENT_OTHER): Admitting: Oncology

## 2023-06-24 ENCOUNTER — Inpatient Hospital Stay: Payer: Medicare HMO

## 2023-06-24 VITALS — BP 115/77 | HR 75 | Temp 98.1°F | Resp 16 | Ht 66.0 in | Wt 147.6 lb

## 2023-06-24 DIAGNOSIS — C61 Malignant neoplasm of prostate: Secondary | ICD-10-CM | POA: Diagnosis not present

## 2023-06-24 DIAGNOSIS — D631 Anemia in chronic kidney disease: Secondary | ICD-10-CM | POA: Diagnosis not present

## 2023-06-24 DIAGNOSIS — N189 Chronic kidney disease, unspecified: Secondary | ICD-10-CM | POA: Diagnosis not present

## 2023-06-24 DIAGNOSIS — D649 Anemia, unspecified: Secondary | ICD-10-CM

## 2023-06-24 NOTE — Telephone Encounter (Signed)
 Patient has been scheduled for follow-up visit per 06/24/23 LOS.  Pt given an appt calendar with date and time.

## 2023-06-25 ENCOUNTER — Inpatient Hospital Stay: Payer: Medicare HMO | Admitting: Oncology

## 2023-07-22 DIAGNOSIS — Z95810 Presence of automatic (implantable) cardiac defibrillator: Secondary | ICD-10-CM | POA: Diagnosis not present

## 2023-08-13 DIAGNOSIS — Z79899 Other long term (current) drug therapy: Secondary | ICD-10-CM | POA: Diagnosis not present

## 2023-08-13 DIAGNOSIS — C61 Malignant neoplasm of prostate: Secondary | ICD-10-CM | POA: Diagnosis not present

## 2023-08-13 DIAGNOSIS — Z131 Encounter for screening for diabetes mellitus: Secondary | ICD-10-CM | POA: Diagnosis not present

## 2023-08-13 DIAGNOSIS — E785 Hyperlipidemia, unspecified: Secondary | ICD-10-CM | POA: Diagnosis not present

## 2023-08-19 DIAGNOSIS — M170 Bilateral primary osteoarthritis of knee: Secondary | ICD-10-CM | POA: Diagnosis not present

## 2023-09-02 DIAGNOSIS — I420 Dilated cardiomyopathy: Secondary | ICD-10-CM | POA: Diagnosis not present

## 2023-09-02 DIAGNOSIS — Z95810 Presence of automatic (implantable) cardiac defibrillator: Secondary | ICD-10-CM | POA: Diagnosis not present

## 2023-09-02 DIAGNOSIS — I340 Nonrheumatic mitral (valve) insufficiency: Secondary | ICD-10-CM | POA: Diagnosis not present

## 2023-09-02 DIAGNOSIS — I2510 Atherosclerotic heart disease of native coronary artery without angina pectoris: Secondary | ICD-10-CM | POA: Diagnosis not present

## 2023-10-21 DIAGNOSIS — Z950 Presence of cardiac pacemaker: Secondary | ICD-10-CM | POA: Diagnosis not present

## 2023-10-27 DIAGNOSIS — M1990 Unspecified osteoarthritis, unspecified site: Secondary | ICD-10-CM | POA: Diagnosis not present

## 2023-10-27 DIAGNOSIS — M069 Rheumatoid arthritis, unspecified: Secondary | ICD-10-CM | POA: Diagnosis not present

## 2023-10-27 DIAGNOSIS — R269 Unspecified abnormalities of gait and mobility: Secondary | ICD-10-CM | POA: Diagnosis not present

## 2023-10-27 DIAGNOSIS — D6869 Other thrombophilia: Secondary | ICD-10-CM | POA: Diagnosis not present

## 2023-10-27 DIAGNOSIS — I4430 Unspecified atrioventricular block: Secondary | ICD-10-CM | POA: Diagnosis not present

## 2023-10-27 DIAGNOSIS — Z96653 Presence of artificial knee joint, bilateral: Secondary | ICD-10-CM | POA: Diagnosis not present

## 2023-10-27 DIAGNOSIS — I255 Ischemic cardiomyopathy: Secondary | ICD-10-CM | POA: Diagnosis not present

## 2023-10-27 DIAGNOSIS — I4891 Unspecified atrial fibrillation: Secondary | ICD-10-CM | POA: Diagnosis not present

## 2023-10-27 DIAGNOSIS — I509 Heart failure, unspecified: Secondary | ICD-10-CM | POA: Diagnosis not present

## 2023-10-27 DIAGNOSIS — K219 Gastro-esophageal reflux disease without esophagitis: Secondary | ICD-10-CM | POA: Diagnosis not present

## 2023-10-27 DIAGNOSIS — I349 Nonrheumatic mitral valve disorder, unspecified: Secondary | ICD-10-CM | POA: Diagnosis not present

## 2023-10-27 DIAGNOSIS — I25119 Atherosclerotic heart disease of native coronary artery with unspecified angina pectoris: Secondary | ICD-10-CM | POA: Diagnosis not present

## 2023-10-27 DIAGNOSIS — M4800 Spinal stenosis, site unspecified: Secondary | ICD-10-CM | POA: Diagnosis not present

## 2023-10-27 DIAGNOSIS — I252 Old myocardial infarction: Secondary | ICD-10-CM | POA: Diagnosis not present

## 2023-10-27 DIAGNOSIS — I454 Nonspecific intraventricular block: Secondary | ICD-10-CM | POA: Diagnosis not present

## 2023-10-27 DIAGNOSIS — E785 Hyperlipidemia, unspecified: Secondary | ICD-10-CM | POA: Diagnosis not present

## 2023-10-27 DIAGNOSIS — J449 Chronic obstructive pulmonary disease, unspecified: Secondary | ICD-10-CM | POA: Diagnosis not present

## 2023-10-27 DIAGNOSIS — I099 Rheumatic heart disease, unspecified: Secondary | ICD-10-CM | POA: Diagnosis not present

## 2023-10-27 DIAGNOSIS — M5440 Lumbago with sciatica, unspecified side: Secondary | ICD-10-CM | POA: Diagnosis not present

## 2023-10-27 DIAGNOSIS — H269 Unspecified cataract: Secondary | ICD-10-CM | POA: Diagnosis not present

## 2023-10-27 DIAGNOSIS — I130 Hypertensive heart and chronic kidney disease with heart failure and stage 1 through stage 4 chronic kidney disease, or unspecified chronic kidney disease: Secondary | ICD-10-CM | POA: Diagnosis not present

## 2023-10-27 DIAGNOSIS — N189 Chronic kidney disease, unspecified: Secondary | ICD-10-CM | POA: Diagnosis not present

## 2023-11-18 DIAGNOSIS — Z96652 Presence of left artificial knee joint: Secondary | ICD-10-CM | POA: Diagnosis not present

## 2023-12-16 ENCOUNTER — Other Ambulatory Visit: Payer: Self-pay

## 2023-12-16 ENCOUNTER — Inpatient Hospital Stay: Attending: Oncology

## 2023-12-16 DIAGNOSIS — D500 Iron deficiency anemia secondary to blood loss (chronic): Secondary | ICD-10-CM

## 2023-12-16 DIAGNOSIS — D649 Anemia, unspecified: Secondary | ICD-10-CM | POA: Insufficient documentation

## 2023-12-16 DIAGNOSIS — C61 Malignant neoplasm of prostate: Secondary | ICD-10-CM | POA: Diagnosis present

## 2023-12-16 LAB — IRON AND TIBC
Iron: 111 ug/dL (ref 45–182)
Saturation Ratios: 38 % (ref 17.9–39.5)
TIBC: 293 ug/dL (ref 250–450)
UIBC: 182 ug/dL

## 2023-12-16 LAB — CBC WITH DIFFERENTIAL (CANCER CENTER ONLY)
Abs Immature Granulocytes: 0.01 K/uL (ref 0.00–0.07)
Basophils Absolute: 0.1 K/uL (ref 0.0–0.1)
Basophils Relative: 1 %
Eosinophils Absolute: 0.6 K/uL — ABNORMAL HIGH (ref 0.0–0.5)
Eosinophils Relative: 10 %
HCT: 45.6 % (ref 39.0–52.0)
Hemoglobin: 14.8 g/dL (ref 13.0–17.0)
Immature Granulocytes: 0 %
Lymphocytes Relative: 23 %
Lymphs Abs: 1.4 K/uL (ref 0.7–4.0)
MCH: 30.1 pg (ref 26.0–34.0)
MCHC: 32.5 g/dL (ref 30.0–36.0)
MCV: 92.9 fL (ref 80.0–100.0)
Monocytes Absolute: 0.7 K/uL (ref 0.1–1.0)
Monocytes Relative: 11 %
Neutro Abs: 3.2 K/uL (ref 1.7–7.7)
Neutrophils Relative %: 55 %
Platelet Count: 238 K/uL (ref 150–400)
RBC: 4.91 MIL/uL (ref 4.22–5.81)
RDW: 13.6 % (ref 11.5–15.5)
WBC Count: 6 K/uL (ref 4.0–10.5)
nRBC: 0 % (ref 0.0–0.2)

## 2023-12-16 LAB — CMP (CANCER CENTER ONLY)
ALT: 11 U/L (ref 0–44)
AST: 26 U/L (ref 15–41)
Albumin: 4.1 g/dL (ref 3.5–5.0)
Alkaline Phosphatase: 75 U/L (ref 38–126)
Anion gap: 9 (ref 5–15)
BUN: 17 mg/dL (ref 8–23)
CO2: 26 mmol/L (ref 22–32)
Calcium: 9.4 mg/dL (ref 8.9–10.3)
Chloride: 106 mmol/L (ref 98–111)
Creatinine: 1.27 mg/dL — ABNORMAL HIGH (ref 0.61–1.24)
GFR, Estimated: 57 mL/min — ABNORMAL LOW (ref >60)
Glucose, Bld: 90 mg/dL (ref 70–99)
Potassium: 4.6 mmol/L (ref 3.5–5.1)
Sodium: 140 mmol/L (ref 135–145)
Total Bilirubin: 0.8 mg/dL (ref 0.0–1.2)
Total Protein: 6.7 g/dL (ref 6.5–8.1)

## 2023-12-16 LAB — FERRITIN: Ferritin: 237 ng/mL (ref 24–336)

## 2023-12-16 LAB — PSA: Prostatic Specific Antigen: 0.23 ng/mL (ref 0.00–4.00)

## 2023-12-16 LAB — FOLATE: Folate: >20 ng/mL (ref >5.9)

## 2023-12-16 LAB — VITAMIN B12: Vitamin B-12: 3285 pg/mL — ABNORMAL HIGH (ref 180–914)

## 2023-12-23 ENCOUNTER — Inpatient Hospital Stay

## 2023-12-24 ENCOUNTER — Telehealth: Payer: Self-pay | Admitting: Oncology

## 2023-12-24 ENCOUNTER — Other Ambulatory Visit: Payer: Self-pay | Admitting: Oncology

## 2023-12-24 ENCOUNTER — Inpatient Hospital Stay: Admitting: Oncology

## 2023-12-24 VITALS — BP 136/70 | HR 88 | Temp 98.0°F | Resp 16 | Ht 66.0 in | Wt 153.0 lb

## 2023-12-24 DIAGNOSIS — C61 Malignant neoplasm of prostate: Secondary | ICD-10-CM | POA: Diagnosis not present

## 2023-12-24 DIAGNOSIS — D649 Anemia, unspecified: Secondary | ICD-10-CM

## 2023-12-24 NOTE — Telephone Encounter (Signed)
 Patient has been scheduled for follow-up visit per 12/24/2023 LOS.  Pt given an appt calendar with date and time.

## 2023-12-24 NOTE — Progress Notes (Signed)
 [facility] [address] [place],  [place]  [address] [phone]  Clinic Day:  12/24/2023  Referring physician: Street, Lonni HERO, MD   HISTORY OF PRESENT ILLNESS:  The patient is a 83 y.o. male who I follow for early biochemical recurrence of prostate cancer.  She comes in today for routine follow-up.  Since his last visit, the patient has been doing well.  He continues to deny having any bone pain or other systemic symptoms which concern him for his prostate cancer being more advanced.   This gentleman also has a history of anemia, with past etiologies being iron  deficiency, B12 deficiency and chronic renal insufficiency.  He last received IV iron  in October 2024.  The patient denies having increased fatigue or any overt forms of blood loss which concern him for having progressive anemia.  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  2 out of 8 pelvic lymph nodes removed with his prostate also contained cancer.  There was also evidence of positive margins seen with this surgery.  Due to biochemical relapse of his prostate cancer, the patient received Lupron  injections from August 2017 to October 2019.  Over the next 5+ years, his PSA was undetectable until a low-level biochemical relapse was recently seen in July 2024.    PHYSICAL EXAM:  Blood pressure 136/70, pulse 88, temperature 98 F (36.7 C), temperature source Oral, resp. rate 16, height 5' 6 (1.676 m), weight 153 lb (69.4 kg), SpO2 97%. Wt Readings from Last 3 Encounters:  12/24/23 153 lb (69.4 kg)  06/24/23 147 lb 9.6 oz (67 kg)  04/04/23 146 lb 3.2 oz (66.3 kg)   Body mass index is 24.69 kg/m. Performance status (ECOG): 1 - Symptomatic but completely ambulatory Physical Exam Constitutional:       Appearance: Normal appearance. He is not ill-appearing.     Comments: He is ambulating with a cane  HENT:     Mouth/Throat:     Mouth: Mucous membranes are moist.     Pharynx: Oropharynx is clear. No oropharyngeal exudate or posterior oropharyngeal erythema.  Cardiovascular:     Rate and Rhythm: Normal rate and regular rhythm.     Heart sounds: No murmur heard.    No friction rub. No gallop.  Pulmonary:     Effort: Pulmonary effort is normal. No respiratory distress.     Breath sounds: Normal breath sounds. No wheezing, rhonchi or rales.  Abdominal:     General: Bowel sounds are normal. There is no distension.     Palpations: Abdomen is soft. There is no mass.     Tenderness: There is no abdominal tenderness.  Musculoskeletal:        General: No swelling.     Right hand: Deformity (arthritic changes) present.     Left hand: Deformity (arthritic changes) present.     Right lower leg: No edema.     Left lower leg: No edema.  Lymphadenopathy:     Cervical: No cervical adenopathy.     Upper Body:     Right upper body: No supraclavicular or axillary adenopathy.     Left upper body: No supraclavicular or axillary adenopathy.     Lower Body: No right inguinal adenopathy. No left inguinal adenopathy.  Skin:    General: Skin is warm.  Coloration: Skin is not jaundiced.     Findings: No lesion or rash.  Neurological:     General: No focal deficit present.     Mental Status: He is alert and oriented to person, place, and time. Mental status is at baseline.  Psychiatric:        Mood and Affect: Mood normal.        Behavior: Behavior normal.        Thought Content: Thought content normal.    LABS:      Latest Ref Rng & Units 12/16/2023   10:19 AM 06/23/2023   11:29 AM 04/04/2023    1:33 PM  CBC  WBC 4.0 - 10.5 K/uL 6.0  6.3  5.1   Hemoglobin 13.0 - 17.0 g/dL 85.1  85.6  87.4   Hematocrit 39.0 - 52.0 % 45.6  45.1  39.3   Platelets 150 - 400 K/uL 238  221  258        Latest Ref Rng & Units 12/16/2023   10:19 AM 06/23/2023   11:29 AM 04/04/2023    1:33 PM  CMP  Glucose 70 - 99 mg/dL 90  94  76   BUN 8 - 23 mg/dL 17  17  17    Creatinine 0.61 - 1.24 mg/dL 8.72  8.72  8.69   Sodium 135 - 145 mmol/L 140  143  145   Potassium 3.5 - 5.1 mmol/L 4.6  4.6  4.0   Chloride 98 - 111 mmol/L 106  109  108   CO2 22 - 32 mmol/L 26  23  26    Calcium  8.9 - 10.3 mg/dL 9.4  9.5  9.2   Total Protein 6.5 - 8.1 g/dL 6.7  6.4  6.1   Total Bilirubin 0.0 - 1.2 mg/dL 0.8  0.6  0.4   Alkaline Phos 38 - 126 U/L 75  75  70   AST 15 - 41 U/L 26  19  16    ALT 0 - 44 U/L 11  6  <5     Latest Reference Range & Units 12/16/23 10:19  Iron  45 - 182 ug/dL 888  UIBC ug/dL 817  TIBC 749 - 549 ug/dL 706  Saturation Ratios 17.9 - 39.5 % 38  Ferritin 24 - 336 ng/mL 237  Folate >5.9 ng/mL >20.0  Vitamin B12 180 - 914 pg/mL 3,285 (H)  (H): Data is abnormally high   Latest Reference Range & Units 04/04/23 13:33 06/23/23 11:29 12/16/23 10:19  Prostate Specific Ag, Serum 0.0 - 4.0 ng/mL 0.2    Prostatic Specific Antigen 0.00 - 4.00 ng/mL  0.20 0.23   ASSESSMENT & PLAN:  Assessment/Plan:  A 83 y.o. male with a history of multifactorial anemia and early biochemical recurrence of his Gleason grade 9 prostate cancer.  When evaluating his labs today, his hemoglobin remains excellent.  I am also pleased as his iron , vitamin B12, and folate levels all remain normal.  He knows to continue taking B12 to ensure adequate fortification of his cobalamin stores.  With respect to his prostate cancer, his PSA level remains minimally detectable at 0.23.  For now, his prostate cancer will continue to be followed conservatively.  Overall, this patient appears to be doing well.  I will see him back in 6 months to reassess both his prostate cancer and anemia.  The patient understands all the plans discussed today and is in agreement with them.    Kasara Schomer DELENA Kerns, MD

## 2024-06-22 ENCOUNTER — Inpatient Hospital Stay

## 2024-06-23 ENCOUNTER — Inpatient Hospital Stay: Admitting: Oncology
